# Patient Record
Sex: Female | Born: 1940 | Race: Black or African American | Hispanic: No | Marital: Single | Smoking: Never smoker
Health system: Southern US, Community
[De-identification: ages and names within clinical notes are randomized; demographics above are authoritative.]

## PROBLEM LIST (undated history)

## (undated) DIAGNOSIS — E7800 Pure hypercholesterolemia, unspecified: Secondary | ICD-10-CM

## (undated) DIAGNOSIS — I4891 Unspecified atrial fibrillation: Secondary | ICD-10-CM

## (undated) DIAGNOSIS — K5792 Diverticulitis of intestine, part unspecified, without perforation or abscess without bleeding: Secondary | ICD-10-CM

## (undated) DIAGNOSIS — I10 Essential (primary) hypertension: Secondary | ICD-10-CM

## (undated) DIAGNOSIS — I5032 Chronic diastolic (congestive) heart failure: Secondary | ICD-10-CM

## (undated) HISTORY — PX: CHOLECYSTECTOMY: SHX55

## (undated) HISTORY — PX: COLOSTOMY: SHX63

---

## 2002-10-17 ENCOUNTER — Emergency Department (HOSPITAL_COMMUNITY): Admission: EM | Admit: 2002-10-17 | Discharge: 2002-10-18 | Payer: Self-pay | Admitting: Emergency Medicine

## 2002-10-17 ENCOUNTER — Encounter: Payer: Self-pay | Admitting: Emergency Medicine

## 2012-11-21 ENCOUNTER — Emergency Department (HOSPITAL_COMMUNITY): Payer: Medicare Other

## 2012-11-21 ENCOUNTER — Encounter (HOSPITAL_COMMUNITY): Payer: Self-pay | Admitting: *Deleted

## 2012-11-21 ENCOUNTER — Other Ambulatory Visit: Payer: Self-pay

## 2012-11-21 ENCOUNTER — Emergency Department (HOSPITAL_COMMUNITY)
Admission: EM | Admit: 2012-11-21 | Discharge: 2012-11-21 | Disposition: A | Discharge status: Home / Self Care | Payer: Medicare Other | Attending: Emergency Medicine | Admitting: Emergency Medicine

## 2012-11-21 DIAGNOSIS — R059 Cough, unspecified: Secondary | ICD-10-CM | POA: Insufficient documentation

## 2012-11-21 DIAGNOSIS — E876 Hypokalemia: Secondary | ICD-10-CM | POA: Insufficient documentation

## 2012-11-21 DIAGNOSIS — R5381 Other malaise: Secondary | ICD-10-CM | POA: Insufficient documentation

## 2012-11-21 DIAGNOSIS — R053 Chronic cough: Secondary | ICD-10-CM

## 2012-11-21 DIAGNOSIS — R5383 Other fatigue: Secondary | ICD-10-CM | POA: Insufficient documentation

## 2012-11-21 DIAGNOSIS — R0609 Other forms of dyspnea: Secondary | ICD-10-CM | POA: Insufficient documentation

## 2012-11-21 DIAGNOSIS — R0989 Other specified symptoms and signs involving the circulatory and respiratory systems: Secondary | ICD-10-CM | POA: Insufficient documentation

## 2012-11-21 DIAGNOSIS — E7800 Pure hypercholesterolemia, unspecified: Secondary | ICD-10-CM | POA: Insufficient documentation

## 2012-11-21 DIAGNOSIS — I10 Essential (primary) hypertension: Secondary | ICD-10-CM | POA: Insufficient documentation

## 2012-11-21 DIAGNOSIS — J40 Bronchitis, not specified as acute or chronic: Secondary | ICD-10-CM | POA: Insufficient documentation

## 2012-11-21 DIAGNOSIS — R0600 Dyspnea, unspecified: Secondary | ICD-10-CM

## 2012-11-21 DIAGNOSIS — R05 Cough: Secondary | ICD-10-CM

## 2012-11-21 DIAGNOSIS — Z79899 Other long term (current) drug therapy: Secondary | ICD-10-CM | POA: Insufficient documentation

## 2012-11-21 HISTORY — DX: Essential (primary) hypertension: I10

## 2012-11-21 HISTORY — DX: Pure hypercholesterolemia, unspecified: E78.00

## 2012-11-21 LAB — BASIC METABOLIC PANEL
BUN: 30 mg/dL — ABNORMAL HIGH (ref 6–23)
CO2: 27 mEq/L (ref 19–32)
Calcium: 9.3 mg/dL (ref 8.4–10.5)
Chloride: 104 mEq/L (ref 96–112)
Creatinine, Ser: 0.86 mg/dL (ref 0.50–1.10)
GFR calc Af Amer: 76 mL/min — ABNORMAL LOW (ref >90)
GFR calc non Af Amer: 65 mL/min — ABNORMAL LOW (ref >90)
Glucose, Bld: 129 mg/dL — ABNORMAL HIGH (ref 70–99)
Potassium: 3.2 mEq/L — ABNORMAL LOW (ref 3.5–5.1)
Sodium: 144 mEq/L (ref 135–145)

## 2012-11-21 LAB — CBC
HCT: 42.8 % (ref 36.0–46.0)
Hemoglobin: 14.3 g/dL (ref 12.0–15.0)
MCH: 30 pg (ref 26.0–34.0)
MCHC: 33.4 g/dL (ref 30.0–36.0)
MCV: 89.9 fL (ref 78.0–100.0)
Platelets: 214 10*3/uL (ref 150–400)
RBC: 4.76 MIL/uL (ref 3.87–5.11)
RDW: 14.4 % (ref 11.5–15.5)
WBC: 8.2 10*3/uL (ref 4.0–10.5)

## 2012-11-21 MED ORDER — BENZONATATE 100 MG PO CAPS: 200.0000 mg | ORAL_CAPSULE | Freq: Two times a day (BID) | ORAL | Status: DC | PRN

## 2012-11-21 MED ORDER — IPRATROPIUM BROMIDE 0.02 % IN SOLN
0.5000 mg | Freq: Once | RESPIRATORY_TRACT | Status: AC
  Administered 2012-11-21: 0.5 mg via RESPIRATORY_TRACT
  Filled 2012-11-21: qty 2.5

## 2012-11-21 MED ORDER — PREDNISONE 20 MG PO TABS: 40.0000 mg | ORAL_TABLET | Freq: Every day | ORAL | Status: DC

## 2012-11-21 MED ORDER — ALBUTEROL SULFATE HFA 108 (90 BASE) MCG/ACT IN AERS
2.0000 | INHALATION_SPRAY | Freq: Once | RESPIRATORY_TRACT | Status: AC
  Administered 2012-11-21: 2 via RESPIRATORY_TRACT
  Filled 2012-11-21: qty 6.7

## 2012-11-21 MED ORDER — ALBUTEROL SULFATE (5 MG/ML) 0.5% IN NEBU
5.0000 mg | INHALATION_SOLUTION | Freq: Once | RESPIRATORY_TRACT | Status: AC
  Administered 2012-11-21: 5 mg via RESPIRATORY_TRACT
  Filled 2012-11-21: qty 1

## 2012-11-21 NOTE — ED Provider Notes (Addendum)
History     CSN: [id number]  Arrival date & time 11/21/12  1233   First MD Initiated Contact with Patient 11/21/12 1311      Chief Complaint  Patient presents with  . Shortness of Breath  . Weakness    (Consider location/radiation/quality/duration/timing/severity/associated sxs/prior treatment) HPI Comments: Maria Travis has appeared short of breath to her daughter.  She just returned from a funeral in [place] and her daughter remarked throughout the trip that with any significant exertion, especially walking up stairs, she becomes SOB.  She also has a dry, nonproductive cough.  She has experienced no fevers, chills, leg swelling, or NVD.  She denies chest pain/discomfort.  Patient is a 72 y.o. female presenting with shortness of breath. The history is provided by the patient. No language interpreter was used.  Shortness of Breath  The current episode started more than 2 weeks ago. The onset was gradual. The problem occurs occasionally. The problem has been gradually worsening. The symptoms are relieved by rest. The symptoms are aggravated by activity. Associated symptoms include cough and shortness of breath. Pertinent negatives include no chest pain, no chest pressure, no orthopnea, no fever, no rhinorrhea, no sore throat, no stridor and no wheezing. There was no intake of a foreign body. She has not inhaled smoke recently. She has had intermittent steroid use (completed a steroid course prescribed by a local urgent care.). Prior hospitalizations: no recent. She has had no prior ICU admissions. She has had no prior intubations. Her past medical history is significant for asthma in the family. Her past medical history does not include asthma, bronchiolitis, past wheezing or eczema. She has been behaving normally. Urine output has been normal. There were no sick contacts. Recently, medical care has been given by the PCP and at another facility. Services received include medications given.     Past Medical History  Diagnosis Date  . Hypertension   . High cholesterol     Past Surgical History  Procedure Date  . Cholecystectomy     History reviewed. No pertinent family history.  History  Substance Use Topics  . Smoking status: Never Smoker   . Smokeless tobacco: Never Used  . Alcohol Use: Yes     Comment: weekends    OB History    Grav Para Term Preterm Abortions TAB SAB Ect Mult Living                  Review of Systems  Constitutional: Negative for fever.  HENT: Negative for sore throat and rhinorrhea.   Respiratory: Positive for cough and shortness of breath. Negative for wheezing and stridor.   Cardiovascular: Negative for chest pain and orthopnea.  All other systems reviewed and are negative.    Allergies  Review of patient's allergies indicates no known allergies.  Home Medications   Current Outpatient Rx  Name  Route  Sig  Dispense  Refill  . ALLOPURINOL 100 MG PO TABS   Oral   Take 100 mg by mouth daily.         Marland Kitchen AMLODIPINE BESYLATE 5 MG PO TABS   Oral   Take 5 mg by mouth daily.         . ATORVASTATIN CALCIUM 20 MG PO TABS   Oral   Take 20 mg by mouth daily.         Marland Kitchen HYDROCHLOROTHIAZIDE 25 MG PO TABS   Oral   Take 25 mg by mouth daily.         Marland Kitchen  METOPROLOL SUCCINATE ER 50 MG PO TB24   Oral   Take 50 mg by mouth daily. Take with or immediately following a meal.         . OMEPRAZOLE 20 MG PO CPDR   Oral   Take 20 mg by mouth daily.         Marland Kitchen POTASSIUM CHLORIDE CRYS ER 20 MEQ PO TBCR   Oral   Take 20 mEq by mouth daily.           BP 145/91  Pulse 80  Temp 97.5 F (36.4 C) (Oral)  Resp 20  Wt 145 lb (65.772 kg)  SpO2 98%  Physical Exam  Nursing note and vitals reviewed. Constitutional: She is oriented to person, place, and time. She appears well-developed and well-nourished. No distress. She is not intubated.  HENT:  Head: Normocephalic and atraumatic.  Right Ear: External ear normal.  Left Ear:  External ear normal.  Nose: Nose normal.  Mouth/Throat: Oropharynx is clear and moist. No oropharyngeal exudate.  Eyes: Conjunctivae normal are normal. Pupils are equal, round, and reactive to light. Right eye exhibits no discharge. Left eye exhibits no discharge. No scleral icterus.  Neck: Normal range of motion. Neck supple. No JVD present. No tracheal deviation present.  Cardiovascular: Normal rate, regular rhythm, normal heart sounds and intact distal pulses.  Exam reveals no gallop and no friction rub.   No murmur heard. Pulmonary/Chest: Effort normal. No accessory muscle usage or stridor. No apnea, not tachypneic and not bradypneic. She is not intubated. No respiratory distress. She has decreased breath sounds in the right lower field and the left lower field. She has no wheezes. She has no rhonchi. She has no rales. She exhibits no tenderness.       Pt has a rattling nonproductive cough.  Abdominal: Soft. Bowel sounds are normal. She exhibits no distension and no mass. There is no tenderness. There is no rebound and no guarding.  Musculoskeletal: Normal range of motion. She exhibits no edema and no tenderness.  Lymphadenopathy:    She has no cervical adenopathy.  Neurological: She is alert and oriented to person, place, and time. No cranial nerve deficit.  Skin: Skin is warm and dry. No rash noted. She is not diaphoretic. No erythema. No pallor.  Psychiatric: She has a normal mood and affect. Her behavior is normal.    ED Course  Procedures (including critical care time)   Labs Reviewed  CBC  BASIC METABOLIC PANEL   Dg Chest 2 View  11/21/2012  *RADIOLOGY REPORT*  Clinical Data: Exertional dyspnea, cough  CHEST - 2 VIEW  Comparison: None.  Findings: Normal cardiac silhouette.  There is mild tortuosity and possible slight ectasia of the thoracic aorta.  Minimal left basilar linear heterogeneous opacities favored to represent subsegmental atelectasis.  No focal airspace opacity.  No  pleural effusion or pneumothorax.  No acute osseous abnormalities.  Post cholecystectomy.  IMPRESSION: 1.   Mild tortuosity and possible slight ectasia of the thoracic aorta without definite acute cardiopulmonary disease. 2. Minimal left basilar atelectasis or scar.   Original Report Authenticated By: Tacey Ruiz, MD      No diagnosis found.   Date: 11/21/2012  Rate: 75 bpm  Rhythm: sinus  QRS Axis: left  Intervals: normal  ST/T Wave abnormalities: nonspecific ST changes  Conduction Disutrbances:none  Narrative Interpretation:   Old EKG Reviewed: none available      MDM  Pt presents for evaluation of a persistent cough and  shortness of breath.  She appears nontoxic, note stable VS, NAD.  She has no hx of bronchitis/COPD/emphysema.  Will obtain basic labs and a CXR.  Will review an EKG.  Will administer albuterol/atrovent and reassess.  1520.  Pt stable, NAD.  She has mild hypokalemia.  This is not an acute issue.  She has no clinical evidence of thromboembolic event.  There is no pulmonary edema, effusion, or infiltrate on the CXR.  Her evaluation and exam are consistent with bronchitis.  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  Encouraged her to follow-up closely with her primary physician.      Tobin Chad, MD 11/21/12 1524  Tobin Chad, MD 11/21/12 1526  Tobin Chad, MD 11/21/12 1610

## 2012-11-21 NOTE — Progress Notes (Signed)
[name] and son-in-law are with her. They are also taking care of the patient in #10. They are going back and forth between the two rooms. Offered support; presence.

## 2012-11-21 NOTE — ED Notes (Signed)
Pt ambulated greater than 2ft without difficulty. Pts sats maintained 98-99% on RA. HR increased from 99-103. Pt reported increased SOB with the activity. Pt was able to speak short sentences while ambulating.

## 2012-11-21 NOTE — ED Notes (Signed)
Pt visiting from [place], brought in by daughter with reports of shortness of breath, generalized weakness as well hand and leg cramps that has worsened over the last 2 weeks.

## 2013-12-22 IMAGING — CR DG CHEST 2V
2 series · 2 of 2 positions shown · non-contrast
Comparison: None.

CLINICAL DATA: Exertional dyspnea, cough

CHEST - 2 VIEW

[w chest pa]
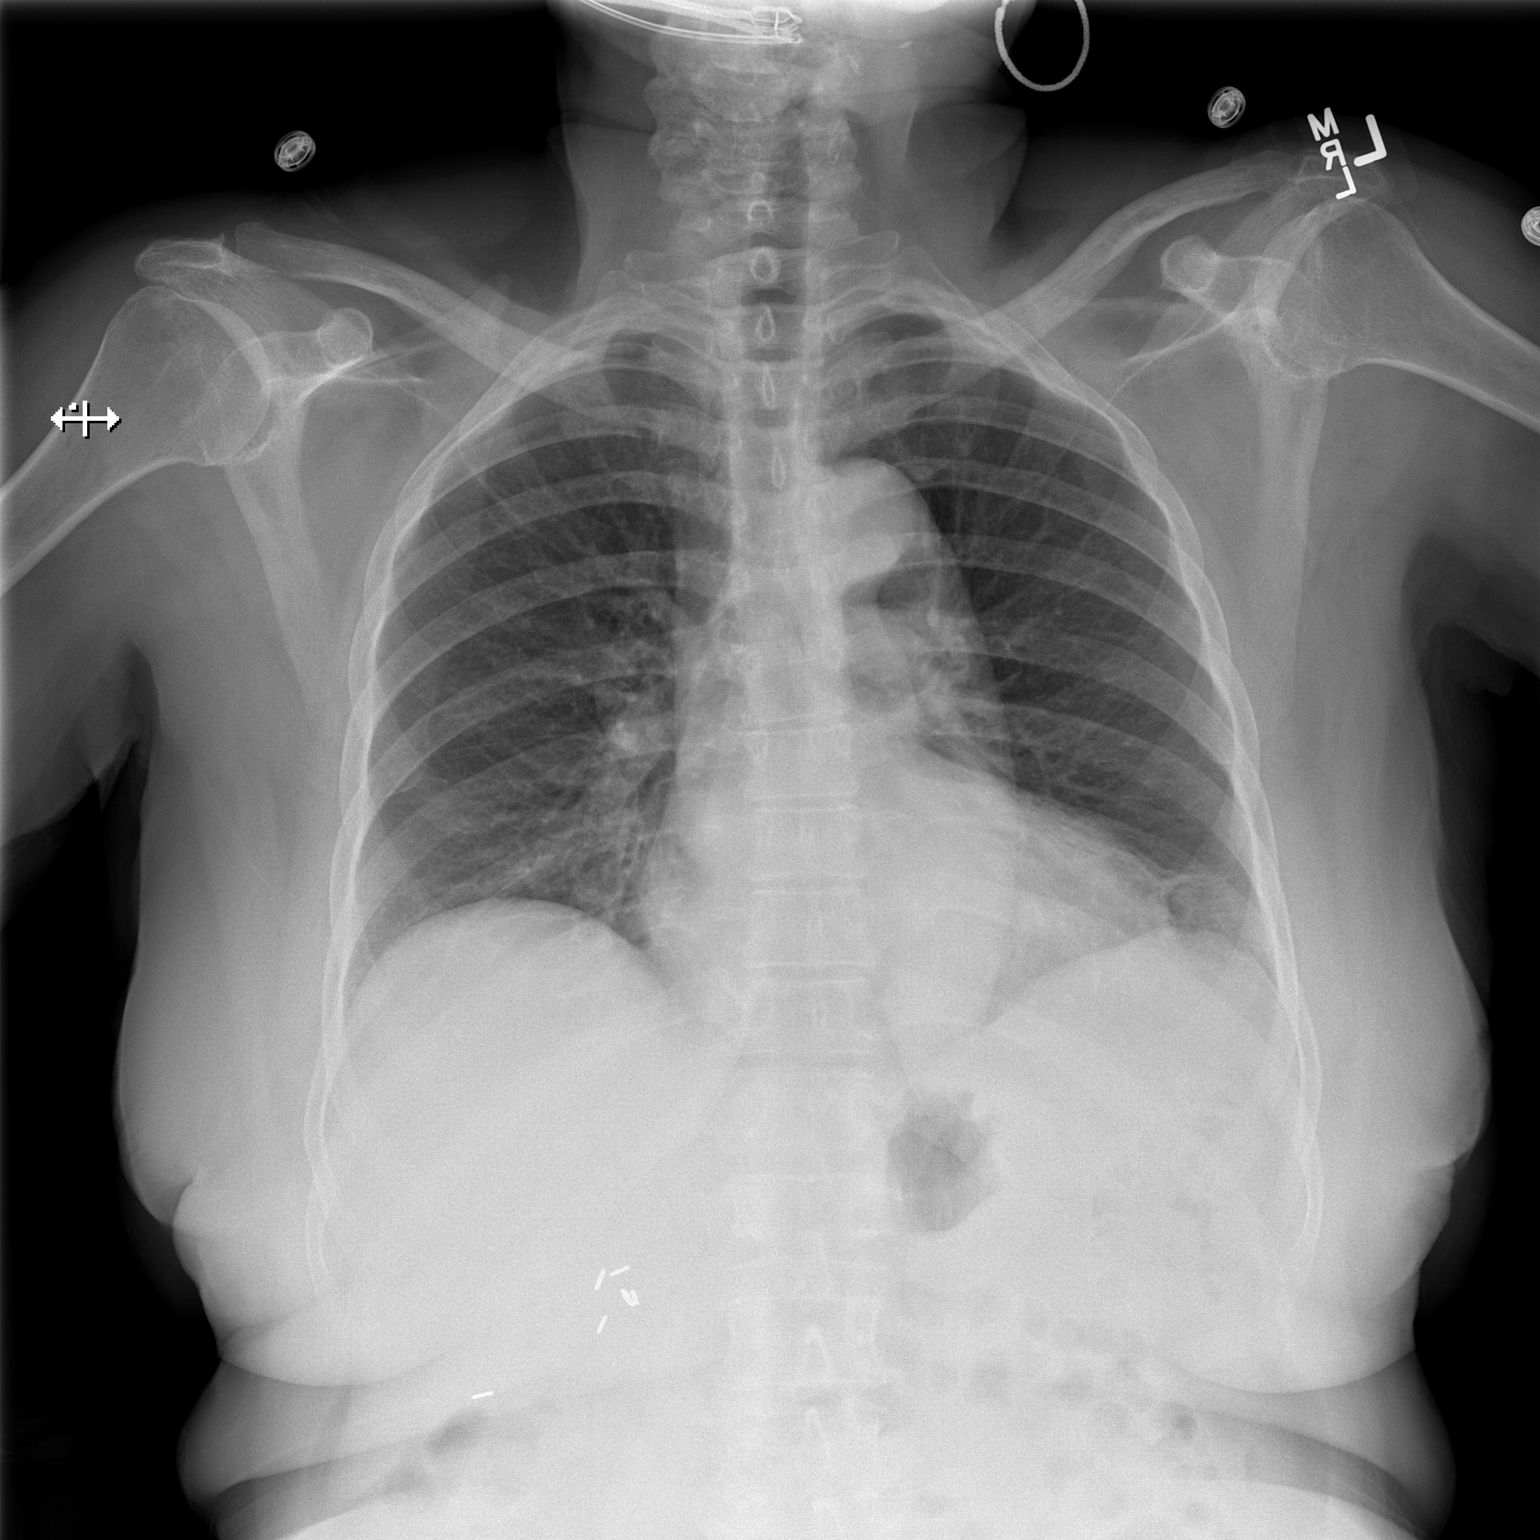

[w chest lat]
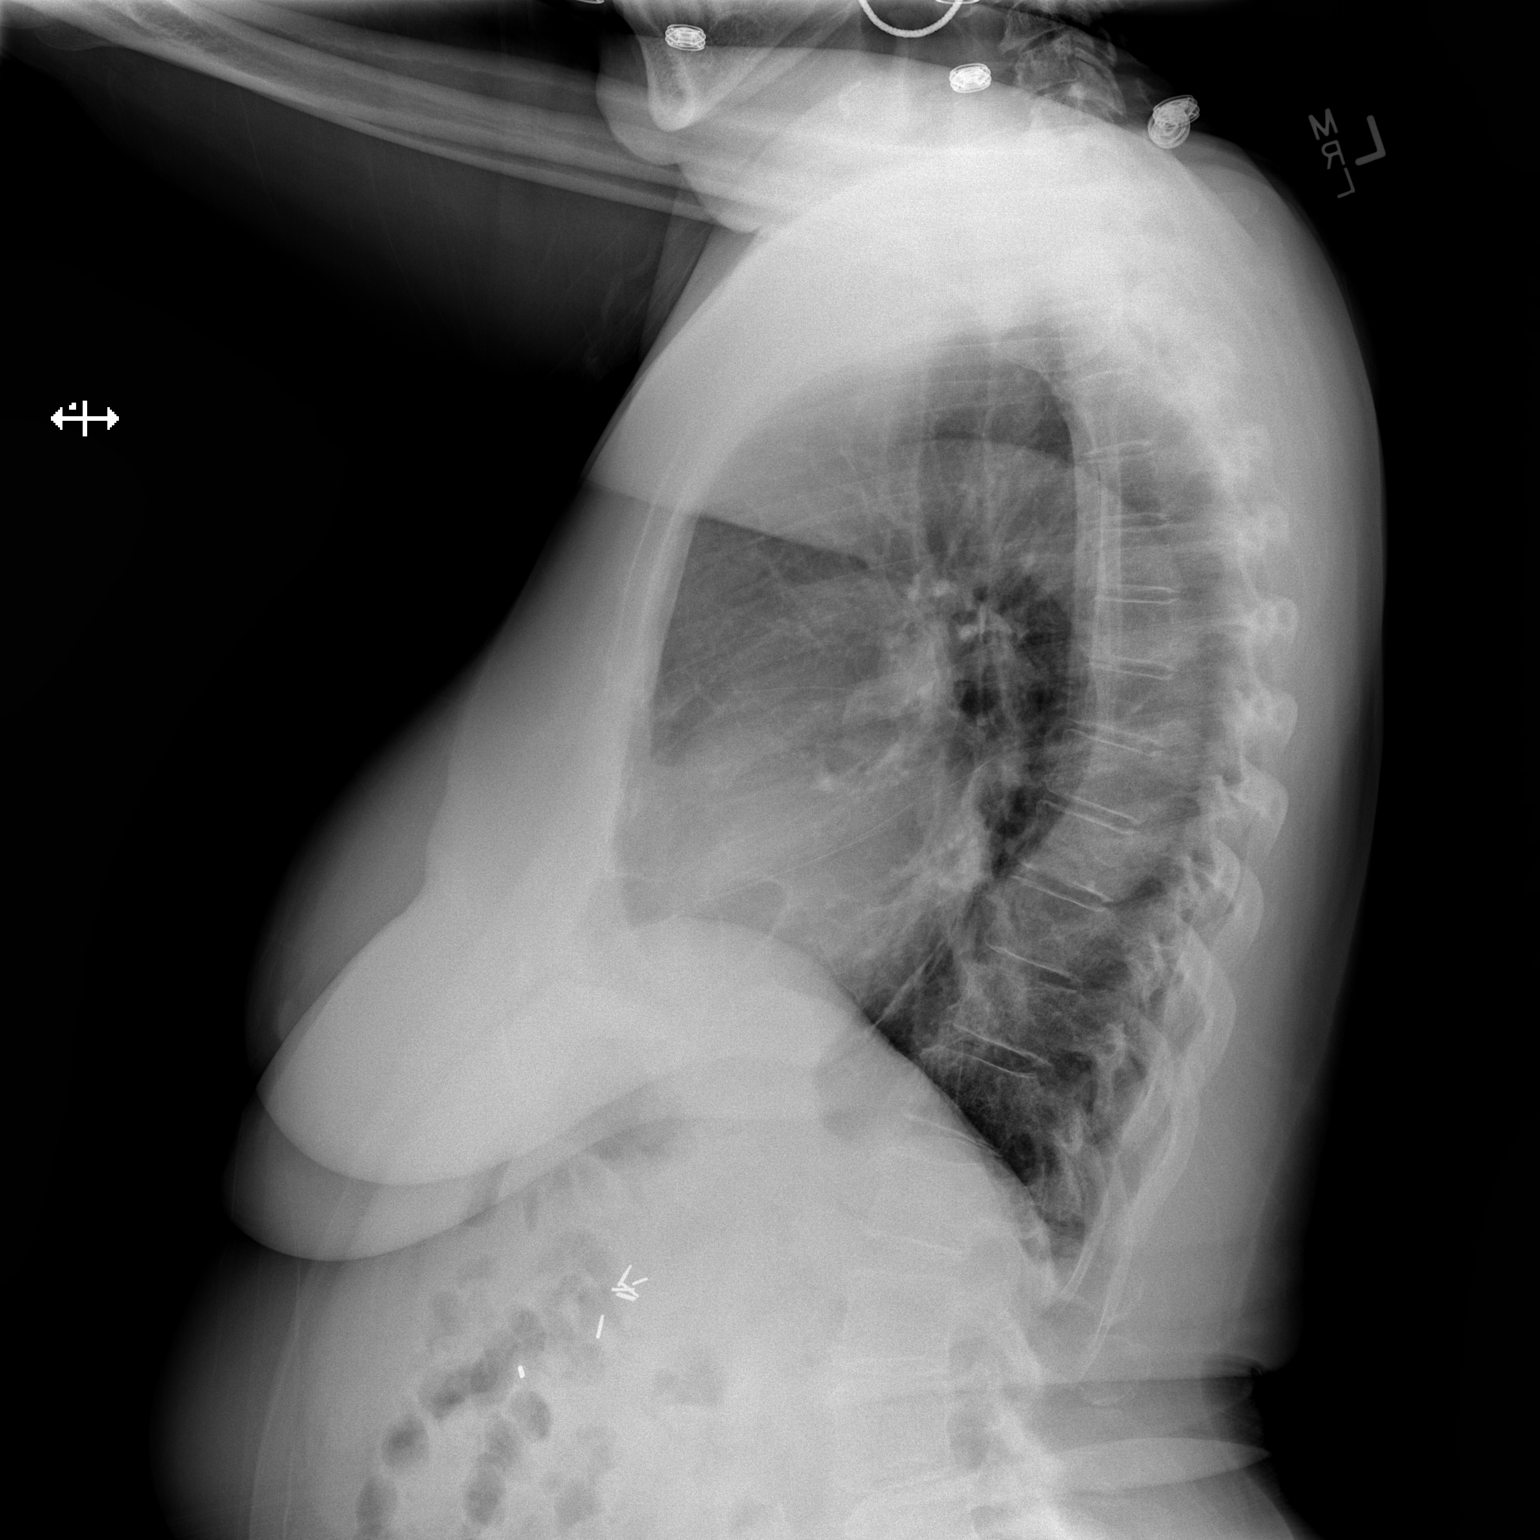

[2 of 2 positions shown; findings below may reference images not displayed]

FINDINGS: Normal cardiac silhouette.  There is mild tortuosity and
possible slight ectasia of the thoracic aorta.  Minimal left
basilar linear heterogeneous opacities favored to represent
subsegmental atelectasis.  No focal airspace opacity.  No pleural
effusion or pneumothorax.  No acute osseous abnormalities.  Post
cholecystectomy.
IMPRESSION: 1.   Mild tortuosity and possible slight ectasia of the thoracic
aorta without definite acute cardiopulmonary disease.
2. Minimal left basilar atelectasis or scar.

## 2020-10-17 ENCOUNTER — Observation Stay (HOSPITAL_COMMUNITY)
Admission: EM | Admit: 2020-10-17 | Discharge: 2020-10-18 | Disposition: A | Discharge status: Home / Self Care | Payer: Medicare Other | Attending: Cardiovascular Disease | Admitting: Cardiovascular Disease

## 2020-10-17 ENCOUNTER — Encounter (HOSPITAL_COMMUNITY): Payer: Self-pay | Admitting: Cardiovascular Disease

## 2020-10-17 ENCOUNTER — Emergency Department (HOSPITAL_COMMUNITY): Payer: Medicare Other

## 2020-10-17 ENCOUNTER — Other Ambulatory Visit: Payer: Self-pay

## 2020-10-17 DIAGNOSIS — Z79899 Other long term (current) drug therapy: Secondary | ICD-10-CM | POA: Insufficient documentation

## 2020-10-17 DIAGNOSIS — R55 Syncope and collapse: Secondary | ICD-10-CM | POA: Diagnosis not present

## 2020-10-17 DIAGNOSIS — I480 Paroxysmal atrial fibrillation: Principal | ICD-10-CM | POA: Diagnosis present

## 2020-10-17 DIAGNOSIS — E876 Hypokalemia: Secondary | ICD-10-CM

## 2020-10-17 DIAGNOSIS — Z20822 Contact with and (suspected) exposure to covid-19: Secondary | ICD-10-CM | POA: Diagnosis not present

## 2020-10-17 DIAGNOSIS — R42 Dizziness and giddiness: Secondary | ICD-10-CM | POA: Diagnosis present

## 2020-10-17 DIAGNOSIS — R778 Other specified abnormalities of plasma proteins: Secondary | ICD-10-CM | POA: Diagnosis not present

## 2020-10-17 DIAGNOSIS — R7989 Other specified abnormal findings of blood chemistry: Secondary | ICD-10-CM

## 2020-10-17 DIAGNOSIS — I4891 Unspecified atrial fibrillation: Secondary | ICD-10-CM

## 2020-10-17 DIAGNOSIS — E785 Hyperlipidemia, unspecified: Secondary | ICD-10-CM

## 2020-10-17 DIAGNOSIS — I10 Essential (primary) hypertension: Secondary | ICD-10-CM

## 2020-10-17 DIAGNOSIS — N179 Acute kidney failure, unspecified: Secondary | ICD-10-CM | POA: Diagnosis not present

## 2020-10-17 LAB — CBC
HCT: 41.9 % (ref 36.0–46.0)
Hemoglobin: 13.6 g/dL (ref 12.0–15.0)
MCH: 32.1 pg (ref 26.0–34.0)
MCHC: 32.5 g/dL (ref 30.0–36.0)
MCV: 98.8 fL (ref 80.0–100.0)
Platelets: 163 10*3/uL (ref 150–400)
RBC: 4.24 MIL/uL (ref 3.87–5.11)
RDW: 12.8 % (ref 11.5–15.5)
WBC: 8.2 10*3/uL (ref 4.0–10.5)
nRBC: 0 % (ref 0.0–0.2)

## 2020-10-17 LAB — BASIC METABOLIC PANEL
Anion gap: 12 (ref 5–15)
BUN: 30 mg/dL — ABNORMAL HIGH (ref 8–23)
CO2: 21 mmol/L — ABNORMAL LOW (ref 22–32)
Calcium: 9 mg/dL (ref 8.9–10.3)
Chloride: 108 mmol/L (ref 98–111)
Creatinine, Ser: 1.55 mg/dL — ABNORMAL HIGH (ref 0.44–1.00)
GFR, Estimated: 33 mL/min — ABNORMAL LOW (ref >60)
Glucose, Bld: 131 mg/dL — ABNORMAL HIGH (ref 70–99)
Potassium: 2.9 mmol/L — ABNORMAL LOW (ref 3.5–5.1)
Sodium: 141 mmol/L (ref 135–145)

## 2020-10-17 LAB — RESP PANEL BY RT-PCR (FLU A&B, COVID) ARPGX2
Influenza A by PCR: NEGATIVE
Influenza B by PCR: NEGATIVE
SARS Coronavirus 2 by RT PCR: NEGATIVE

## 2020-10-17 LAB — TROPONIN I (HIGH SENSITIVITY)
Troponin I (High Sensitivity): 69 ng/L — ABNORMAL HIGH (ref <18)
Troponin I (High Sensitivity): 80 ng/L — ABNORMAL HIGH (ref <18)

## 2020-10-17 MED ORDER — ACETAMINOPHEN 500 MG PO TABS
1000.0000 mg | ORAL_TABLET | Freq: Once | ORAL | Status: AC
  Administered 2020-10-17: 1000 mg via ORAL
  Filled 2020-10-17: qty 2

## 2020-10-17 MED ORDER — METOPROLOL SUCCINATE ER 50 MG PO TB24
50.0000 mg | ORAL_TABLET | Freq: Every day | ORAL | Status: DC
  Administered 2020-10-17: 50 mg via ORAL
  Filled 2020-10-17: qty 1

## 2020-10-17 MED ORDER — ASPIRIN 81 MG PO CHEW
324.0000 mg | CHEWABLE_TABLET | Freq: Once | ORAL | Status: AC
  Administered 2020-10-17: 324 mg via ORAL
  Filled 2020-10-17: qty 4

## 2020-10-17 MED ORDER — SODIUM CHLORIDE 0.9 % IV SOLN: INTRAVENOUS | Status: DC

## 2020-10-17 MED ORDER — HEPARIN (PORCINE) 25000 UT/250ML-% IV SOLN
800.0000 [IU]/h | INTRAVENOUS | Status: DC
  Administered 2020-10-17: 650 [IU]/h via INTRAVENOUS
  Filled 2020-10-17 (×3): qty 250

## 2020-10-17 MED ORDER — HEPARIN BOLUS VIA INFUSION
3000.0000 [IU] | Freq: Once | INTRAVENOUS | Status: AC
  Administered 2020-10-17: 3000 [IU] via INTRAVENOUS
  Filled 2020-10-17: qty 3000

## 2020-10-17 MED ORDER — POTASSIUM CHLORIDE 10 MEQ/100ML IV SOLN
10.0000 meq | Freq: Once | INTRAVENOUS | Status: AC
  Administered 2020-10-17: 10 meq via INTRAVENOUS
  Filled 2020-10-17: qty 100

## 2020-10-17 MED ORDER — ALLOPURINOL 100 MG PO TABS
100.0000 mg | ORAL_TABLET | Freq: Every day | ORAL | Status: DC
  Administered 2020-10-17 – 2020-10-18 (×2): 100 mg via ORAL
  Filled 2020-10-17 (×3): qty 1

## 2020-10-17 MED ORDER — DILTIAZEM HCL-DEXTROSE 125-5 MG/125ML-% IV SOLN (PREMIX)
5.0000 mg/h | INTRAVENOUS | Status: DC
  Administered 2020-10-17: 5 mg/h via INTRAVENOUS
  Administered 2020-10-18: 15 mg/h via INTRAVENOUS
  Filled 2020-10-17 (×3): qty 125

## 2020-10-17 MED ORDER — DILTIAZEM LOAD VIA INFUSION
15.0000 mg | Freq: Once | INTRAVENOUS | Status: AC
  Administered 2020-10-17: 15 mg via INTRAVENOUS
  Filled 2020-10-17: qty 15

## 2020-10-17 MED ORDER — POTASSIUM CHLORIDE CRYS ER 20 MEQ PO TBCR
40.0000 meq | EXTENDED_RELEASE_TABLET | Freq: Every day | ORAL | Status: DC
  Administered 2020-10-17 – 2020-10-18 (×2): 40 meq via ORAL
  Filled 2020-10-17 (×2): qty 2

## 2020-10-17 MED ORDER — POTASSIUM CHLORIDE CRYS ER 20 MEQ PO TBCR
40.0000 meq | EXTENDED_RELEASE_TABLET | Freq: Once | ORAL | Status: AC
  Administered 2020-10-17: 40 meq via ORAL
  Filled 2020-10-17: qty 2

## 2020-10-17 MED ORDER — COLCHICINE 0.6 MG PO TABS
0.6000 mg | ORAL_TABLET | Freq: Every day | ORAL | Status: DC
  Administered 2020-10-17 – 2020-10-18 (×2): 0.6 mg via ORAL
  Filled 2020-10-17 (×2): qty 1

## 2020-10-17 MED ORDER — ACETAMINOPHEN 325 MG PO TABS: 650.0000 mg | ORAL_TABLET | ORAL | Status: DC | PRN

## 2020-10-17 MED ORDER — PANTOPRAZOLE SODIUM 40 MG PO TBEC
40.0000 mg | DELAYED_RELEASE_TABLET | Freq: Every day | ORAL | Status: DC
  Administered 2020-10-17 – 2020-10-18 (×2): 40 mg via ORAL
  Filled 2020-10-17 (×2): qty 1

## 2020-10-17 MED ORDER — AMLODIPINE BESYLATE 5 MG PO TABS: 5.0000 mg | ORAL_TABLET | Freq: Every day | ORAL | Status: DC

## 2020-10-17 MED ORDER — ATORVASTATIN CALCIUM 10 MG PO TABS
20.0000 mg | ORAL_TABLET | Freq: Every day | ORAL | Status: DC
  Administered 2020-10-17 – 2020-10-18 (×2): 20 mg via ORAL
  Filled 2020-10-17 (×2): qty 2

## 2020-10-17 MED ORDER — ONDANSETRON HCL 4 MG/2ML IJ SOLN: 4.0000 mg | Freq: Four times a day (QID) | INTRAMUSCULAR | Status: DC | PRN

## 2020-10-17 NOTE — ED Triage Notes (Signed)
Pt BIB [facility] w/ chief complaint of new onset of afib. Pt from [place] and over the past few days states she has felt intermittently dizzy. Pt suffered a fall last night without injury or hitting head. Pt also had two syncopal episodes in front of family this morning. Pt denies chest pain, n.v.d, SoB, but just feeling weak and dizzy. Pt noted to be in Afib but does not have hx of Afib. Pt not on a blood thinner. EMS endorsed HR initially in the 190s but after 500 of fluid rates stayed between 80-120. BP stable, all other VS stable. Pt AOx4.

## 2020-10-17 NOTE — Progress Notes (Signed)
ANTICOAGULATION CONSULT NOTE - Initial Consult  Pharmacy Consult for heparin Indication: atrial fibrillation  No Known Allergies  Patient Measurements:   Heparin Dosing Weight: 57.3 kg   Vital Signs: BP: 144/90 (12/24 1815) Pulse Rate: 56 (12/24 1815)  Labs: Recent Labs    10/17/20 1055 10/17/20 1352  HGB 13.6  --   HCT 41.9  --   PLT 163  --   CREATININE 1.55*  --   TROPONINIHS 69* 80*    CrCl cannot be calculated (Unknown ideal weight.).   Medical History: Past Medical History:  Diagnosis Date  . High cholesterol   . Hypertension     Medications:  Medications Prior to Admission  Medication Sig Dispense Refill Last Dose  . allopurinol (ZYLOPRIM) 100 MG tablet Take 100 mg by mouth daily.   Past Week at Unknown time  . amLODipine (NORVASC) 5 MG tablet Take 5 mg by mouth daily.   Past Week at Unknown time  . atorvastatin (LIPITOR) 20 MG tablet Take 20 mg by mouth daily.   Past Week at Unknown time  . colchicine 0.6 MG tablet Take 0.6 mg by mouth daily.   Past Week at Unknown time  . losartan (COZAAR) 50 MG tablet Take 50 mg by mouth daily.   Past Week at Unknown time  . metoprolol succinate (TOPROL-XL) 50 MG 24 hr tablet Take 50 mg by mouth daily. Take with or immediately following a meal.   Past Week at [facility]  . pantoprazole (PROTONIX) 40 MG tablet Take 40 mg by mouth daily.   Past Week at Unknown time  . benzonatate (TESSALON) 100 MG capsule Take 2 capsules (200 mg total) by mouth 2 (two) times daily as needed for cough. (Patient not taking: Reported on 10/17/2020) 12 capsule 0 Not Taking at Unknown time  . predniSONE (DELTASONE) 20 MG tablet Take 2 tablets (40 mg total) by mouth daily. Take 40 mg by mouth daily for 3 days, then 20mg  by mouth daily for 3 days, then 10mg  daily for 3 days (Patient not taking: No sig reported) 12 tablet 0 Not Taking at Unknown time    Assessment: 89 YOF with new onset Afib to start IV heparin.   H/H and Plt wnl. SCr 1.55.   Goal of  Therapy:  Heparin level 0.3-0.7 units/ml Monitor platelets by anticoagulation protocol: Yes   Plan:  -Heparin 3000 units IV bolus followed by heparin infusion at 650 units/hr  -F/u 8 hr HL -Monitor daily HL, CBC and s/s of bleeding   Albertina Parr, PharmD., BCPS, BCCCP Clinical Pharmacist Please refer to [facility] for unit-specific pharmacist

## 2020-10-17 NOTE — Progress Notes (Signed)
Called pharmacy to request dose and sent a message. Awaiting medication.

## 2020-10-17 NOTE — ED Provider Notes (Signed)
[facility] EMERGENCY DEPARTMENT Provider Note   CSN: [id number] Arrival date & time: 10/17/20  1039     History Chief Complaint  Patient presents with  . Irregular Heart Beat  . Dizziness  . Loss of Consciousness    Maria Travis is a 79 y.o. female.  HPI   Patient presents to the emergency room for evaluation of dizziness.  Patient is here visiting family.  Patient has had some intermittent dizziness over the last couple days.  According to family she ended up falling last night.  Today she had 2 near fainting spells.  Family was concerned and called EMS.  Patient denies having any complaints right now.  She was feeling a little bit lightheaded this morning.  She denies having any trouble with chest pain.  She is not having any shortness of breath.  She denies any fevers or chills.  She denies any vomiting or diarrhea.  Patient is not aware of having any history with her heart or lungs.  Past Medical History:  Diagnosis Date  . High cholesterol   . Hypertension     There are no problems to display for this patient.   Past Surgical History:  Procedure Laterality Date  . CHOLECYSTECTOMY       OB History   No obstetric history on file.     No family history on file.  Social History   Tobacco Use  . Smoking status: Never Smoker  . Smokeless tobacco: Never Used  Substance Use Topics  . Alcohol use: Yes    Comment: weekends  . Drug use: No    Home Medications Prior to Admission medications   Medication Sig Start Date End Date Taking? Authorizing Provider  allopurinol (ZYLOPRIM) 100 MG tablet Take 100 mg by mouth daily.   Yes [provider]  amLODipine (NORVASC) 5 MG tablet Take 5 mg by mouth daily.   Yes [provider]  atorvastatin (LIPITOR) 20 MG tablet Take 20 mg by mouth daily.   Yes [provider]  colchicine 0.6 MG tablet Take 0.6 mg by mouth daily.   Yes [provider]  losartan (COZAAR) 50  MG tablet Take 50 mg by mouth daily.   Yes [provider]  metoprolol succinate (TOPROL-XL) 50 MG 24 hr tablet Take 50 mg by mouth daily. Take with or immediately following a meal.   Yes [provider]  pantoprazole (PROTONIX) 40 MG tablet Take 40 mg by mouth daily.   Yes [provider]  benzonatate (TESSALON) 100 MG capsule Take 2 capsules (200 mg total) by mouth 2 (two) times daily as needed for cough. Patient not taking: Reported on 10/17/2020 11/21/12   Powers, Lonia Farber, MD  predniSONE (DELTASONE) 20 MG tablet Take 2 tablets (40 mg total) by mouth daily. Take 40 mg by mouth daily for 3 days, then 20mg  by mouth daily for 3 days, then 10mg  daily for 3 days Patient not taking: No sig reported 11/21/12   Powers, Lonia Farber, MD    Allergies    Patient has no known allergies.  Review of Systems   Review of Systems  All other systems reviewed and are negative.   Physical Exam Updated Vital Signs BP 126/88   Pulse 87   Resp (!) 22   SpO2 98%   Physical Exam Vitals and nursing note reviewed.  Constitutional:      General: She is not in acute distress.    Appearance: She is well-developed and  well-nourished.  HENT:     Head: Normocephalic and atraumatic.     Right Ear: External ear normal.     Left Ear: External ear normal.  Eyes:     General: No scleral icterus.       Right eye: No discharge.        Left eye: No discharge.     Conjunctiva/sclera: Conjunctivae normal.  Neck:     Trachea: No tracheal deviation.  Cardiovascular:     Rate and Rhythm: Tachycardia present. Rhythm irregular.     Pulses: Intact distal pulses.  Pulmonary:     Effort: Pulmonary effort is normal. No respiratory distress.     Breath sounds: Normal breath sounds. No stridor. No wheezing or rales.  Abdominal:     General: Bowel sounds are normal. There is no distension.     Palpations: Abdomen is soft.     Tenderness: There is no abdominal tenderness. There is no guarding or  rebound.  Musculoskeletal:        General: No tenderness or edema.     Cervical back: Neck supple.  Skin:    General: Skin is warm and dry.     Findings: No rash.  Neurological:     Mental Status: She is alert.     Cranial Nerves: No cranial nerve deficit (no facial droop, extraocular movements intact, no slurred speech).     Sensory: No sensory deficit.     Motor: No abnormal muscle tone or seizure activity.     Coordination: Coordination normal.     Deep Tendon Reflexes: Strength normal.  Psychiatric:        Mood and Affect: Mood and affect normal.     ED Results / Procedures / Treatments   Labs (all labs ordered are listed, but only abnormal results are displayed) Labs Reviewed  BASIC METABOLIC PANEL - Abnormal; Notable for the following components:      Result Value   Potassium 2.9 (*)    CO2 21 (*)    Glucose, Bld 131 (*)    BUN 30 (*)    Creatinine, Ser 1.55 (*)    GFR, Estimated 33 (*)    All other components within normal limits  TROPONIN I (HIGH SENSITIVITY) - Abnormal; Notable for the following components:   Troponin I (High Sensitivity) 69 (*)    All other components within normal limits  CBC  CBG MONITORING, ED  TROPONIN I (HIGH SENSITIVITY)    EKG EKG Interpretation  Date/Time:  Friday October 17 2020 10:44:56 EST Ventricular Rate:  150 PR Interval:    QRS Duration: 74 QT Interval:  295 QTC Calculation: 466 R Axis:   -132 Text Interpretation: Atrial fibrillation with rapid V-rate Inferior infarct, old Abnormal lateral Q waves Anterior infarct, old a fib new since last tracing Confirmed by Dorie Rank [phone]) on 10/17/2020 11:11:28 AM   Radiology DG Chest Portable 1 View  Result Date: 10/17/2020 CLINICAL DATA:  Palpitations. EXAM: PORTABLE CHEST 1 VIEW COMPARISON:  11/21/2012 FINDINGS: 1126 hours. The cardio pericardial silhouette is enlarged. Right lung is clear. Streaky retrocardiac opacity suggests atelectasis. No pulmonary edema or dense focal  airspace consolidation. No pleural effusion. Telemetry leads overlie the chest. IMPRESSION: Cardiomegaly with left base atelectasis. Electronically Signed   By: Misty Stanley M.D.   On: 10/17/2020 11:42    Procedures Procedures (including critical care time)  Medications Ordered in ED Medications  0.9 %  sodium chloride infusion ( Intravenous New Bag/Given 10/17/20 1135)  diltiazem (  CARDIZEM) 1 mg/mL load via infusion 15 mg (15 mg Intravenous Bolus from Bag 10/17/20 1130)    And  diltiazem (CARDIZEM) 125 mg in dextrose 5% 125 mL (1 mg/mL) infusion (10 mg/hr Intravenous Rate/Dose Change 10/17/20 1353)  potassium chloride 10 mEq in 100 mL IVPB (has no administration in time range)  potassium chloride SA (KLOR-CON) CR tablet 40 mEq (has no administration in time range)  aspirin chewable tablet 324 mg (324 mg Oral Given 10/17/20 1115)    ED Course  I have reviewed the triage vital signs and the nursing notes.  Pertinent labs & imaging results that were available during my care of the patient were reviewed by me and considered in my medical decision making (see chart for details).  Clinical Course as of 10/17/20 1428  Fri Oct 17, 2020  1207 Patient noted to be in rapid A. fib on arrival.  Heart rate was in the 140s.  Patient started on a Cardizem infusion [JK]  1247 Heart rate improved.  Still appears to be in atrial fibrillation on the monitor [JK]    Clinical Course User Index [JK] Dorie Rank, MD   MDM Rules/Calculators/A&P     CHA2DS2-VASc Score: 4                     Patient presents ED for evaluation of palpitations and syncope.  Patient noted to be in new onset atrial fibrillation with rapid rate.  Patient started IV Cardizem infusion with some improvement.  Currently patient is rate controlled but she still is on a Cardizem infusion.  Patient's labs were notable for hypokalemia.  Slight increase in troponin likely related to her rapid A. fib.  She is not having any chest pain.  I  doubt ACS.  I will consult with the cardiology service considering her new onset A. fib. Final Clinical Impression(s) / ED Diagnoses Final diagnoses:  Atrial fibrillation, rapid ([place])  Hypokalemia      Dorie Rank, MD 10/18/20 [phone]

## 2020-10-17 NOTE — ED Provider Notes (Signed)
  Provider Note MRN:  [id number]  Arrival date & time: 10/17/20    ED Course and Medical Decision Making  Assumed care from Dr. Tomi Bamberger at shift change.  New onset A. fib, cardiology consulted for admission.  Admitted to cardiology for further care.  Procedures  Final Clinical Impressions(s) / ED Diagnoses     ICD-10-CM   1. Atrial fibrillation, rapid ([place])  I48.91   2. Hypokalemia  E87.6     ED Discharge Orders    None      Discharge Instructions   None     Barth Kirks. [place]onia Small, [place] [email or web address]    Maudie Flakes, MD 10/17/20 [phone]

## 2020-10-17 NOTE — Plan of Care (Signed)

## 2020-10-17 NOTE — ED Notes (Signed)
Pt going to CT

## 2020-10-17 NOTE — H&P (Signed)
Cardiology Admission History and Physical:   Patient ID: Maria Travis MRN: [id number]; DOB: [birth year]   Admission date: 10/17/2020  Primary Care Provider: Pcp, No [facility] Cardiologist: New to [facility]  Chief Complaint: Dizziness, syncope   Patient Profile:   Maria Travis is a 79 y.o. 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   History of Present Illness:   Maria Travis is an 79 year old female with a history stated above who presented to [facility] on 10/17/2020 after two witnessed syncopal episodes yesterday and early this AM.  Patient is visiting from [place] where she lives independently. She states that she had a near syncopal episode while getting ready in her bathroom.  She reports feeling mildly dizzy however no palpitations or racing heart.  She reports that she did not lose consciousness and rather caught herself on the side of the sink.  Earlier this morning, the daughter states that they had walked up a flight of stairs at which time the patient was grabbing the handrail in the walls.  Upon walking into the bedroom, the patient leaned over and was holding the dresser.  When prompted for response by her daughter, the patient just today with a glazed, blank stare with no response.  The daughter then stood behind her and lowered her to the floor.  This episode lasted several minutes in duration.  EMS was called for transport to the ED for further evaluation.  HPI slightly difficult to obtain given poor event recall.  She denies any episode of chest pain, palpitations, shortness of breath, LE edema, orthopnea, nausea or vomiting.  She cannot go into much detail regarding prior episodes.  Daughter is giving most details.  She has no prior history of CAD and has never  undergone ischemic evaluation.  She has no prior history of palpitations or atrial fibrillation.  Daughter reports several other falls which have occurred while the patient was in [place] however she does not know the history behind the falls.  There was no injury.   On EMS arrival, HR found to be in the 190 bpm range however moderately stabilized to the 80-120 bpm range after IVF. EKG showed new onset atrial fibrillation with RVR.  Patient was placed on a diltiazem infusion with some improvement.  K+ found to be 2.9 and replaced by EDP.  HST found to be mildly elevated at 69 with a repeat at 80 however in the absence of chest pain and no prior cardiac history, elevation likely in the setting of RVR. CXR with cardiomegaly with left base atelectasis.  Past Medical History:  Diagnosis Date  . High cholesterol   . Hypertension     Past Surgical History:  Procedure Laterality Date  . CHOLECYSTECTOMY       Medications Prior to Admission: Prior to Admission medications   Medication Sig Start Date End Date Taking? Authorizing Provider  allopurinol (ZYLOPRIM) 100 MG tablet Take 100 mg by mouth daily.   Yes [provider]  amLODipine (NORVASC) 5 MG tablet Take 5 mg by mouth daily.   Yes [provider]  atorvastatin (LIPITOR) 20 MG tablet Take 20 mg by mouth daily.   Yes [provider]  colchicine 0.6 MG tablet Take 0.6 mg by mouth daily.   Yes [provider]  losartan (COZAAR) 50 MG tablet Take 50  mg by mouth daily.   Yes [provider]  metoprolol succinate (TOPROL-XL) 50 MG 24 hr tablet Take 50 mg by mouth daily. Take with or immediately following a meal.   Yes [provider]  pantoprazole (PROTONIX) 40 MG tablet Take 40 mg by mouth daily.   Yes [provider]  benzonatate (TESSALON) 100 MG capsule Take 2 capsules (200 mg total) by mouth 2 (two) times daily as needed for cough. Patient not taking: Reported on 10/17/2020 11/21/12    Powers, Lonia Farber, MD  predniSONE (DELTASONE) 20 MG tablet Take 2 tablets (40 mg total) by mouth daily. Take 40 mg by mouth daily for 3 days, then 20mg  by mouth daily for 3 days, then 10mg  daily for 3 days Patient not taking: No sig reported 11/21/12   Powers, Lonia Farber, MD     Allergies:   No Known Allergies  Social History:   Social History   Socioeconomic History  . Marital status: Single    Spouse name: Not on file  . Number of children: Not on file  . Years of education: Not on file  . Highest education level: Not on file  Occupational History  . Not on file  Tobacco Use  . Smoking status: Never Smoker  . Smokeless tobacco: Never Used  Substance and Sexual Activity  . Alcohol use: Yes    Comment: weekends  . Drug use: No  . Sexual activity: Not on file  Other Topics Concern  . Not on file  Social History Narrative  . Not on file   Social Determinants of Health   Financial Resource Strain: Not on file  Food Insecurity: Not on file  Transportation Needs: Not on file  Physical Activity: Not on file  Stress: Not on file  Social Connections: Not on file  Intimate Partner Violence: Not on file    Family History:   The patient's family history is not on file.    ROS:  Please see the history of present illness.  All other ROS reviewed and negative.     Physical Exam/Data:   Vitals:   10/17/20 1330 10/17/20 1430 10/17/20 1445 10/17/20 1500  BP: 126/88 (!) 142/79 (!) 152/92 (!) 159/94  Pulse: 87 63 93 (!) 108  Resp: (!) 22 (!) 21 19 20   SpO2: 98% 99% 98% 97%   No intake or output data in the 24 hours ending 10/17/20 1541 Last 3 Weights 11/21/2012  Weight (lbs) 145 lb  Weight (kg) 65.772 kg     There is no height or weight on file to calculate BMI.   General: Elderly, NAD Neck: Negative for carotid bruits. No JVD Lungs:Clear to ausculation bilaterally. No wheezes, rales, or rhonchi. Breathing is unlabored. Cardiovascular: Irregularly irregular. +  murmur Abdomen: Soft, non-tender, non-distended. No rebound/guarding. No obvious abdominal masses. Extremities: No edema.  Radial pulses 2+ bilaterally Neuro: Alert and oriented. No focal deficits. No facial asymmetry. MAE spontaneously. Psych: Responds to questions appropriately with normal affect.    EKG:  The ECG that was done 10/17/2020 was personally reviewed and demonstrates atrial fibrillation with HR at 150 bpm  Relevant CV Studies:  None  Laboratory Data:  High Sensitivity Troponin:   Recent Labs  Lab 10/17/20 1055 10/17/20 1352  TROPONINIHS 69* 80*      Chemistry Recent Labs  Lab 10/17/20 1055  NA 141  K 2.9*  CL 108  CO2 21*  GLUCOSE 131*  BUN 30*  CREATININE 1.55*  CALCIUM 9.0  GFRNONAA 33*  ANIONGAP 12    No results for input(s): PROT, ALBUMIN, AST, ALT, ALKPHOS, BILITOT in the last 168 hours. Hematology Recent Labs  Lab 10/17/20 1055  WBC 8.2  RBC 4.24  HGB 13.6  HCT 41.9  MCV 98.8  MCH 32.1  MCHC 32.5  RDW 12.8  PLT 163   BNPNo results for input(s): BNP, PROBNP in the last 168 hours.  DDimer No results for input(s): DDIMER in the last 168 hours.   Radiology/Studies:  DG Chest Portable 1 View  Result Date: 10/17/2020 CLINICAL DATA:  Palpitations. EXAM: PORTABLE CHEST 1 VIEW COMPARISON:  11/21/2012 FINDINGS: 1126 hours. The cardio pericardial silhouette is enlarged. Right lung is clear. Streaky retrocardiac opacity suggests atelectasis. No pulmonary edema or dense focal airspace consolidation. No pleural effusion. Telemetry leads overlie the chest. IMPRESSION: Cardiomegaly with left base atelectasis. Electronically Signed   By: Misty Stanley M.D.   On: 10/17/2020 11:42   Assessment and Plan:   1.  New onset atrial fibrillation with RVR with presumed syncope: -HPI relatively difficult to obtain given poor historian.  Patient reports to episodes of near syncope/syncope while visiting her daughter from [place].  Initial episode occurred while  in the bathroom at which time she did not lose consciousness however did have dizziness which lasted several minutes in duration.  On the second episode, the patient and daughter had walked a flight of stairs and the patient appeared to be leaning over and resting on a dresser.  When prompted for response by her daughter, the patient had a blank stare at which time the patient was lowered to the floor by her daughter when they called EMS.  She denies chest pain, or palpitations.  Difficult to discern exactly because of episodes described above.  On EMS arrival, patient found to be in AF with RVR and was started on IV diltiazem with rate control.  She has not taken her PTA Toprol-XL.  Rates per telemetry review are currently in the one ten range.  She is resting comfortably. -Would recommend adding PTA Toprol XL 50 mg daily to her regimen given stable BP -Obtain echocardiogram to assess for structural disease -Would also recommend non-contrast head CT given the atypical presentation of atrial fibrillation to r/o CVA -Would start IV heparin for anticoagulation however will need to discuss long-term AC given reported multiple falls at her home in [place] per daughter.  2.  HTN: -Stable, 159/94, 152/92, 142/79 -Hold home losartan 50 given AKI for now -Continue Toprol XL 50 and amlodipine 5 -Currently IV diltiazem. If abnormal echo, will need to stop   3.  HLD: -Continue atorvastatin 20  4.  Hypokalemia: -K+, 2.9 on hospital admission replaced per EDP -Follow with daily BMET  5.  Elevated troponin: -Low suspicion for ACS given the absence of angina and no prior history of CAD -Elevation likely in the setting of rapid rates on hospital presentation -We will check echocardiogram given new AF and assess for LV dysfunction  6. AKI: -Creatinine today, 1.51 -Remote baseline appears to be in the 0.8 range  -Hold losartan for now -May need IVF -Follow with daily BMET          CHA2DS2-VASc Score  = 4  This indicates a 4.8% annual risk of stroke. The patient's score is based upon: CHF History: No HTN History: Yes Diabetes History: No Stroke History: No Vascular Disease History: No Age Score: 2 Gender Score: 1    Severity of Illness: The appropriate patient status  for this patient is OBSERVATION. Observation status is judged to be reasonable and necessary in order to provide the required intensity of service to ensure the patient's safety. The patient's presenting symptoms, physical exam findings, and initial radiographic and laboratory data in the context of their medical condition is felt to place them at decreased risk for further clinical deterioration. Furthermore, it is anticipated that the patient will be medically stable for discharge from the hospital within 2 midnights of admission. The following factors support the patient status of observation.   " The patient's presenting symptoms include syncope. " The physical exam findings include atrial fibrillation with RVR. " The initial radiographic and laboratory data are abnormal EKG.     For questions or updates, please contact [place] Please consult [email or web address] for contact info under     Signed, Maria Drown, NP  10/17/2020 3:41 PM

## 2020-10-18 ENCOUNTER — Observation Stay (HOSPITAL_BASED_OUTPATIENT_CLINIC_OR_DEPARTMENT_OTHER): Payer: Medicare Other

## 2020-10-18 DIAGNOSIS — R778 Other specified abnormalities of plasma proteins: Secondary | ICD-10-CM

## 2020-10-18 DIAGNOSIS — E785 Hyperlipidemia, unspecified: Secondary | ICD-10-CM

## 2020-10-18 DIAGNOSIS — I4891 Unspecified atrial fibrillation: Secondary | ICD-10-CM

## 2020-10-18 DIAGNOSIS — I361 Nonrheumatic tricuspid (valve) insufficiency: Secondary | ICD-10-CM

## 2020-10-18 DIAGNOSIS — E782 Mixed hyperlipidemia: Secondary | ICD-10-CM

## 2020-10-18 DIAGNOSIS — N179 Acute kidney failure, unspecified: Secondary | ICD-10-CM

## 2020-10-18 DIAGNOSIS — I10 Essential (primary) hypertension: Secondary | ICD-10-CM

## 2020-10-18 DIAGNOSIS — I351 Nonrheumatic aortic (valve) insufficiency: Secondary | ICD-10-CM

## 2020-10-18 DIAGNOSIS — I340 Nonrheumatic mitral (valve) insufficiency: Secondary | ICD-10-CM

## 2020-10-18 DIAGNOSIS — I480 Paroxysmal atrial fibrillation: Principal | ICD-10-CM

## 2020-10-18 LAB — ECHOCARDIOGRAM COMPLETE
AR max vel: 1.29 cm2
AV Area VTI: 1.21 cm2
AV Area mean vel: 1.22 cm2
AV Mean grad: 4 mmHg
AV Peak grad: 7.5 mmHg
Ao pk vel: 1.37 m/s
Area-P 1/2: 3.17 cm2
Height: 60 in
P 1/2 time: 376 msec
S' Lateral: 3 cm
Weight: 2017.65 oz

## 2020-10-18 LAB — BASIC METABOLIC PANEL
Anion gap: 11 (ref 5–15)
BUN: 20 mg/dL (ref 8–23)
CO2: 18 mmol/L — ABNORMAL LOW (ref 22–32)
Calcium: 8.7 mg/dL — ABNORMAL LOW (ref 8.9–10.3)
Chloride: 110 mmol/L (ref 98–111)
Creatinine, Ser: 1.06 mg/dL — ABNORMAL HIGH (ref 0.44–1.00)
GFR, Estimated: 53 mL/min — ABNORMAL LOW (ref >60)
Glucose, Bld: 100 mg/dL — ABNORMAL HIGH (ref 70–99)
Potassium: 4.2 mmol/L (ref 3.5–5.1)
Sodium: 139 mmol/L (ref 135–145)

## 2020-10-18 LAB — CBC
HCT: 35 % — ABNORMAL LOW (ref 36.0–46.0)
Hemoglobin: 12.1 g/dL (ref 12.0–15.0)
MCH: 32.8 pg (ref 26.0–34.0)
MCHC: 34.6 g/dL (ref 30.0–36.0)
MCV: 94.9 fL (ref 80.0–100.0)
Platelets: 162 10*3/uL (ref 150–400)
RBC: 3.69 MIL/uL — ABNORMAL LOW (ref 3.87–5.11)
RDW: 12.8 % (ref 11.5–15.5)
WBC: 9.9 10*3/uL (ref 4.0–10.5)
nRBC: 0 % (ref 0.0–0.2)

## 2020-10-18 LAB — LIPID PANEL
Cholesterol: 162 mg/dL (ref 0–200)
HDL: 53 mg/dL (ref >40)
LDL Cholesterol: 79 mg/dL (ref 0–99)
Total CHOL/HDL Ratio: 3.1 RATIO
Triglycerides: 148 mg/dL (ref <150)
VLDL: 30 mg/dL (ref 0–40)

## 2020-10-18 LAB — TSH: TSH: 1.288 u[IU]/mL (ref 0.350–4.500)

## 2020-10-18 LAB — HEPARIN LEVEL (UNFRACTIONATED): Heparin Unfractionated: 0.19 IU/mL — ABNORMAL LOW (ref 0.30–0.70)

## 2020-10-18 LAB — MAGNESIUM: Magnesium: 1.5 mg/dL — ABNORMAL LOW (ref 1.7–2.4)

## 2020-10-18 MED ORDER — METOPROLOL SUCCINATE ER 50 MG PO TB24: 75.0000 mg | ORAL_TABLET | Freq: Every day | ORAL | 0 refills | Status: DC

## 2020-10-18 MED ORDER — METOPROLOL SUCCINATE ER 50 MG PO TB24
75.0000 mg | ORAL_TABLET | Freq: Every day | ORAL | Status: DC
  Administered 2020-10-18: 75 mg via ORAL
  Filled 2020-10-18: qty 1

## 2020-10-18 MED ORDER — AMLODIPINE BESYLATE 5 MG PO TABS
5.0000 mg | ORAL_TABLET | Freq: Every day | ORAL | Status: DC
  Administered 2020-10-18: 5 mg via ORAL
  Filled 2020-10-18: qty 1

## 2020-10-18 MED ORDER — HEPARIN BOLUS VIA INFUSION
1000.0000 [IU] | Freq: Once | INTRAVENOUS | Status: AC
  Administered 2020-10-18: 1000 [IU] via INTRAVENOUS
  Filled 2020-10-18: qty 1000

## 2020-10-18 MED ORDER — MAGNESIUM SULFATE 2 GM/50ML IV SOLN
2.0000 g | Freq: Once | INTRAVENOUS | Status: AC
  Administered 2020-10-18: 2 g via INTRAVENOUS
  Filled 2020-10-18: qty 50

## 2020-10-18 MED ORDER — PERFLUTREN LIPID MICROSPHERE
1.0000 mL | INTRAVENOUS | Status: AC | PRN
  Administered 2020-10-18: 3 mL via INTRAVENOUS
  Filled 2020-10-18: qty 10

## 2020-10-18 NOTE — Progress Notes (Signed)
Progress Note  Patient Name: Geniyah Eischeid Date of Encounter: 10/18/2020  [facility] Cardiologist: Dr. Skeet Latch  Subjective   No chest pain or shortness of breath this morning. Converted to normal sinus rhythm  Inpatient Medications    Scheduled Meds: . allopurinol  100 mg Oral Daily  . amLODipine  5 mg Oral Daily  . atorvastatin  20 mg Oral Daily  . colchicine  0.6 mg Oral Daily  . metoprolol succinate  75 mg Oral Daily  . pantoprazole  40 mg Oral Daily  . potassium chloride  40 mEq Oral Daily   Continuous Infusions: . sodium chloride Stopped (10/17/20 1900)   PRN Meds: acetaminophen, ondansetron (ZOFRAN) IV   Vital Signs    Vitals:   10/18/20 0300 10/18/20 0400 10/18/20 0500 10/18/20 0600  BP: 124/90 121/85 126/73 123/62  Pulse:  62    Resp:  20 16 16   Temp:    98.5 F (36.9 C)  TempSrc:    Oral  SpO2:      Weight:   57.2 kg   Height:        Intake/Output Summary (Last 24 hours) at 10/18/2020 0827 Last data filed at 10/18/2020 0500 Gross per 24 hour  Intake 562 ml  Output 800 ml  Net -238 ml   Last 3 Weights 10/18/2020 10/17/2020 10/17/2020  Weight (lbs) 126 lb 1.7 oz 126 lb 1.7 oz 126 lb 5.2 oz  Weight (kg) 57.2 kg 57.2 kg 57.3 kg      Telemetry    Normal sinus rhythm- Personally Reviewed  ECG    Not performed today- Personally Reviewed  Physical Exam   GEN: No acute distress.   Neck: No JVD Cardiac: RRR, no murmurs, rubs, or gallops.  Respiratory: Clear to auscultation bilaterally. GI: Soft, nontender, non-distended  MS: No edema; No deformity. Neuro:  Nonfocal  Psych: Normal affect   Labs    High Sensitivity Troponin:   Recent Labs  Lab 10/17/20 1055 10/17/20 1352  TROPONINIHS 69* 80*      Chemistry Recent Labs  Lab 10/17/20 1055 10/18/20 0208  NA 141 139  K 2.9* 4.2  CL 108 110  CO2 21* 18*  GLUCOSE 131* 100*  BUN 30* 20  CREATININE 1.55* 1.06*  CALCIUM 9.0 8.7*  GFRNONAA 33* 53*  ANIONGAP 12 11      Hematology Recent Labs  Lab 10/17/20 1055 10/18/20 0208  WBC 8.2 9.9  RBC 4.24 3.69*  HGB 13.6 12.1  HCT 41.9 35.0*  MCV 98.8 94.9  MCH 32.1 32.8  MCHC 32.5 34.6  RDW 12.8 12.8  PLT 163 162    BNPNo results for input(s): BNP, PROBNP in the last 168 hours.   DDimer No results for input(s): DDIMER in the last 168 hours.   Radiology    CT HEAD WO CONTRAST  Result Date: 10/17/2020 CLINICAL DATA:  Syncope, recurrent. EXAM: CT HEAD WITHOUT CONTRAST TECHNIQUE: Contiguous axial images were obtained from the base of the skull through the vertex without intravenous contrast. COMPARISON:  CT head 10/17/2002. FINDINGS: Brain: Cerebral ventricle sizes are concordant with the degree of cerebral volume loss. Loss No evidence of large-territorial acute infarction. No parenchymal hemorrhage. Nonspecific bilateral basal ganglia mineralization. No mass lesion. No extra-axial collection. No mass effect or midline shift. No hydrocephalus. Basilar cisterns are patent. Vascular: No hyperdense vessel. Atherosclerotic calcifications are present within the cavernous internal carotid and vertebral arteries. Skull: No acute fracture or focal lesion. Sinuses/Orbits: Mucosal thickening of the right  sphenoid sinus noted. Paranasal sinuses and mastoid air cells are clear. The orbits are unremarkable. Other: None. IMPRESSION: No acute intracranial abnormality. Electronically Signed   By: Iven Finn M.D.   On: 10/17/2020 16:40   DG Chest Portable 1 View  Result Date: 10/17/2020 CLINICAL DATA:  Palpitations. EXAM: PORTABLE CHEST 1 VIEW COMPARISON:  11/21/2012 FINDINGS: 1126 hours. The cardio pericardial silhouette is enlarged. Right lung is clear. Streaky retrocardiac opacity suggests atelectasis. No pulmonary edema or dense focal airspace consolidation. No pleural effusion. Telemetry leads overlie the chest. IMPRESSION: Cardiomegaly with left base atelectasis. Electronically Signed   By: Misty Stanley M.D.    On: 10/17/2020 11:42    Cardiac Studies   2D echo pending  Patient Profile     79 y.o. female visiting her daughter for the holidays from [place] who presented with near syncope and A. fib with RVR.  Assessment & Plan    1: PAF-patient presented with A. fib with RVR with heart rates in the 190s. She had presyncope. She was also hypokalemic. She was placed on IV heparin and IV diltiazem. She is converted back to sinus rhythm. Given her recent history of falls, I do not feel she is a good candidate for oral anticoagulation. 2D echo is pending.  2: Hyperlipidemia-history of hyperlipidemia on atorvastatin with lipid profile today revealing total cholesterol 162, LDL 79 and HDL 53.  3: Essential hypertension-on losartan, amlodipine and beta-blocker at home. Her losartan has been held for renal insufficiency. Her blood pressures have been under good control. We will continue to hold her losartan, and increase her beta-blocker and continue her amlodipine for now.  4: Hypokalemia-potassium repleted, K is 4.2 this morning  5: CKD-serum creatinine has fallen from 1.5 down to 1.06. I suspect this is related to dehydration  6: Elevated troponin-troponin peaked at 80. She had no chest pain. I suspect this was related to demand ischemia from A. fib with RVR. Doubt ACS.  We will get 2D echo. If this is unremarkable okay for discharge home on increased dose of beta-blocker and amlodipine. Hold losartan for now. Would not treat with DOAC given recent falls. Patient needs to obtain a cardiologist when she returns to [place].  For questions or updates, please contact [place] Please consult [email or web address] for contact info under        Signed, Quay Burow, MD  10/18/2020, 8:27 AM

## 2020-10-18 NOTE — Plan of Care (Signed)
  Problem: Clinical Measurements: Goal: Respiratory complications will improve Outcome: Progressing   Problem: Clinical Measurements: Goal: Cardiovascular complication will be avoided Outcome: Progressing   Problem: Clinical Measurements: Goal: Diagnostic test results will improve Outcome: Progressing   Problem: Safety: Goal: Ability to remain free from injury will improve Outcome: Progressing   Problem: Cardiac: Goal: Will achieve and/or maintain adequate cardiac output Outcome: Progressing   Problem: Physical Regulation: Goal: Complications related to the disease process, condition or treatment will be avoided or minimized Outcome: Progressing

## 2020-10-18 NOTE — Discharge Instructions (Signed)
Medication Changes: - STOP Losartan. - INCREASE Metoprolol succinate (Toprol-XL) to 75mg  daily.  - Please continue all other medications as described elsewhere on discharge paperwork.   Please monitor your blood pressure and heart rate at home with the above medication changes. It is very important that you get established with a Cardiologist when you get back to [place].

## 2020-10-18 NOTE — Progress Notes (Signed)
ANTICOAGULATION CONSULT NOTE - Follow Up Consult  Pharmacy Consult for heparin Indication: atrial fibrillation  Labs: Recent Labs    10/17/20 1055 10/17/20 1352 10/18/20 0208  HGB 13.6  --  12.1  HCT 41.9  --  35.0*  PLT 163  --  162  HEPARINUNFRC  --   --  0.19*  CREATININE 1.55*  --   --   TROPONINIHS 69* 80*  --     Assessment: 79yo female subtherapeutic on heparin with initial dosing for Afib; no gtt issues or signs of bleeding per RN.  Goal of Therapy:  Heparin level 0.3-0.7 units/ml   Plan:  Will give small heparin bolus of 1000 units and increase heparin gtt by 3 units/kg/hr to 800 units/hr and check level in 8 hours.    Wynona Neat, PharmD, BCPS  10/18/2020,3:08 AM

## 2020-10-18 NOTE — Progress Notes (Signed)
  Echocardiogram 2D Echocardiogram has been performed with [facility].  Maria Travis 10/18/2020, 9:48 AM

## 2020-10-18 NOTE — Discharge Summary (Signed)
Discharge Summary    Patient ID: Maria Travis MRN: [id number]; DOB: 1940-12-31  Admit date: 10/17/2020 Discharge date: 10/18/2020  Primary Care Provider: Pcp, No  Primary Cardiologist: Patient lives in [place] and is just here visit family for the holidays. Primary Electrophysiologist:  None   Discharge Diagnoses    Principal Problem:   Paroxysmal atrial fibrillation (HCC) Active Problems:   Hypokalemia   Elevated troponin   Hypertension   Hyperlipidemia   AKI (acute kidney injury) [facility])    Diagnostic Studies/Procedures    Echocardiogram 10/18/2020: Impressions: 1. Left ventricular ejection fraction, by estimation, is 55 to 60%. The  left ventricle has normal function. The left ventricle has no regional  wall motion abnormalities. There is mild concentric left ventricular  hypertrophy. Left ventricular diastolic parameters were normal.  2. Right ventricular systolic function is normal. The right ventricular  size is normal. There is moderately elevated pulmonary artery systolic  pressure. The estimated right ventricular systolic pressure is 123XX123 mmHg.  3. The mitral valve is degenerative. Mild mitral valve regurgitation. No  evidence of mitral stenosis.  4. Tricuspid valve regurgitation is moderate to severe.  5. The aortic valve is tricuspid. There is moderate calcification of the  aortic valve. There is moderate thickening of the aortic valve. Aortic  valve regurgitation is moderate. Mild to moderate aortic valve  sclerosis/calcification is present, without  any evidence of aortic stenosis. Aortic valve mean gradient measures 4.0  mmHg.  6. The inferior vena cava is normal in size with greater than 50%  respiratory variability, suggesting right atrial pressure of 3 mmHg.  _____________   History of Present Illness     Maria Travis is a 79 y.o. 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  Patient presented to [facility] ED on 10/17/2020 after 2 witnessed syncopal episodes over the prior 2 days. She is visiting from [place] where she lives independently. Patient reported she had a near syncopal episode while getting ready in her bathroom on the day prior to presentation. She noted feeling mildly dizzy; however, no palpitations or racing heart. She reported that she did not lose consciousness and rather caught herself on the side of the sink.  On morning of presentation, daughter reported that they had walked up a flight of stairs at which time the patient was grabbing the handrail on the walls. Upon walking into the bedroom, the patient leaned over and was holding the dresser. When prompted for response by her daughter, the patient just had a glazed, blank stare with no response. The daughter then stood behind her and lowered her to the floor.  This episode lasted several minutes in duration. EMS was called for transport to the ED for further evaluation. HPI slightly difficult to obtain given poor event recall. Patient denied any episode of chest pain, palpitations, shortness of breath, lower extremity edema, orthopnea, nausea, or vomiting.  She could not go into much detail regarding prior episodes.  Daughter gave most details of the details. She has no prior history of CAD and has never undergone ischemic evaluation.  She has no prior history of palpitations or atrial fibrillation. Daughter reported several other falls which have occurred while the patient was in [place]; however, she does not know the history behind the falls.  There was no injury.   On EMS arrival, heart rate was found to be in the 190's; however, they improved to the 80's to 120's after IV fluids. EKG showed new  onset atrial fibrillation with RVR.  Patient was placed on a Diltiazem infusion with some improvement.  Potassium was 2.9 and was replaced in the ED. High-sensitivity troponin found to be mildly elevated at 69 with a repeat at 80; however, in the absence of chest pain and no prior cardiac history, elevation likely in the setting of RVR. Chest x-ray showed cardiomegaly with left base atelectasis. Head CT was ordered to rule out stroke given patient's inability to provide a detailed history and this showed no acute intracranial abnormalities.   Hospital Course     Consultants: None  Paroxysmal Atrial Fibrillation Rates initially in the 190's. She had presyncopal/possible syncopal episode with this. She was noted to be hypokalemic with potassium of 2.9 and this was repleted. Magnesium 1.5 today. Patient was given 2g IV mag sulfate to replete. TSH normal 1.288. Per daughter, patient has poor PO intake which likely contributed to electrolyte abnormalities and atrial fibrillation. She was started on IV Cardizem and thankfully converted back to normal sinus rhythm. Echo showed LVEF of 55-60% with normal wall motion, mild LVH, normal diastolic dysfunction, mild MR, moderate to severe TR, moderate AI, and moderately elevated PASP. Will increase home Toprol-XL to 75mg  once daily. CHA2DS2-VASC = 4 (HTN, age x2, female). Patient was started on IV Heparin during admission. However, will not discharge patient on oral anticoagulation given history of falls and concern for bleeding. She will need to get established with a Cardiologist where she lives in [place] for further discussion of this. Would also consider outpatient monitor for further evaluation of presyncope/syncope and to assess atrial fibrillation burden.   Addendum: discharge APP verified telemetry on 10/18/2020 @ 1:26PM, patient is currently in sinus rhythm with clear P waves.   Elevated Troponin High-sensitivity troponin mildly elevated at 69 >> 80. Echo  showed normal EF and normal wall motion. Patient denied any chest pain. Felt to be secondary to atrial fibrillation with RVR and not ACS.  Hypertension BP mildly elevated at times but mostly well controlled. Will stop home Losartan given AKI on admission. Will increase home Toprol-XL as above and restart home Amlodipine 5mg  daily.   Hyperlipidemia Lipid panel this admission: Total Cholesterol 162, Triglycerides 148, HDL 53, LDL 79. Continue home Lipitor 20mg  daily.   Hypokalemia/Hypomagnesium Potassium initially 2.9 on admission. This was repleted and 4.2 on day of discharge. Magnesium 1.5 today. Will replete prior to discharge. Recommend repeat BMET and Magnesium at follow-up visit.  AKI Creatinine 1.51. Remote baseline 7 years ago was around 0.8. Likely due to poor PO intake. Home Losartan was held. Creatinine improved to 1.06 on day of discharge. Recommend repeat BMET at follow-up visit.  Patient seen and examined by Dr. Gwenlyn Found today and determined to be stable for discharge. Patient lives in [place] and is going back home early next week after the Christmas holiday. She will need to get established with Cardiologist in [place]. Medications as below.   Did the patient have an acute coronary syndrome (MI, NSTEMI, STEMI, etc) this admission?:  No.   The elevated Troponin was due to the acute medical illness (demand ischemia).      _____________  Discharge Vitals Blood pressure 129/71, pulse 76, temperature 98.5 F (36.9 C), temperature source Oral, resp. rate (!) 25, height 5' (1.524 m), weight 57.2 kg, SpO2 99 %.  Filed Weights   10/17/20 1815 10/17/20 1930 10/18/20 0500  Weight: 57.3 kg 57.2 kg 57.2 kg    Labs & Radiologic Studies    CBC Recent Labs    10/17/20 1055 10/18/20 0208  WBC 8.2 9.9  HGB 13.6 12.1  HCT 41.9 35.0*  MCV 98.8 94.9  PLT 163 0000000   Basic Metabolic Panel Recent Labs    10/17/20 1055 10/18/20 0208 10/18/20 1116  NA 141 139  --   K 2.9* 4.2  --    CL 108 110  --   CO2 21* 18*  --   GLUCOSE 131* 100*  --   BUN 30* 20  --   CREATININE 1.55* 1.06*  --   CALCIUM 9.0 8.7*  --   MG  --   --  1.5*   Liver Function Tests No results for input(s): AST, ALT, ALKPHOS, BILITOT, PROT, ALBUMIN in the last 72 hours. No results for input(s): LIPASE, AMYLASE in the last 72 hours. High Sensitivity Troponin:   Recent Labs  Lab 10/17/20 1055 10/17/20 1352  TROPONINIHS 69* 80*    BNP Invalid input(s): POCBNP D-Dimer No results for input(s): DDIMER in the last 72 hours. Hemoglobin A1C No results for input(s): HGBA1C in the last 72 hours. Fasting Lipid Panel Recent Labs    10/18/20 0208  CHOL 162  HDL 53  LDLCALC 79  TRIG 148  CHOLHDL 3.1   Thyroid Function Tests Recent Labs    10/18/20 1116  TSH 1.288   _____________  CT HEAD WO CONTRAST  Result Date: 10/17/2020 CLINICAL DATA:  Syncope, recurrent. EXAM: CT HEAD WITHOUT CONTRAST TECHNIQUE: Contiguous axial images were obtained from the base of the skull through the vertex without intravenous contrast. COMPARISON:  CT head 10/17/2002. FINDINGS: Brain: Cerebral ventricle sizes are concordant with the degree of cerebral volume loss. Loss No evidence of large-territorial acute infarction. No parenchymal hemorrhage. Nonspecific bilateral basal ganglia mineralization. No mass lesion. No extra-axial collection. No mass effect or midline shift. No hydrocephalus. Basilar cisterns are patent. Vascular: No hyperdense vessel. Atherosclerotic calcifications are present within the cavernous internal carotid and vertebral arteries. Skull: No acute fracture or focal lesion. Sinuses/Orbits: Mucosal thickening of the right sphenoid sinus noted. Paranasal sinuses and mastoid air cells are clear. The orbits are unremarkable. Other: None. IMPRESSION: No acute intracranial abnormality. Electronically Signed   By: Iven Finn M.D.   On: 10/17/2020 16:40   DG Chest Portable 1 View  Result Date:  10/17/2020 CLINICAL DATA:  Palpitations. EXAM: PORTABLE CHEST 1 VIEW COMPARISON:  11/21/2012 FINDINGS: 1126 hours. The cardio pericardial silhouette is enlarged. Right lung is clear. Streaky retrocardiac opacity suggests atelectasis. No pulmonary edema or dense focal airspace consolidation. No pleural effusion. Telemetry leads overlie the chest. IMPRESSION: Cardiomegaly with left base atelectasis. Electronically Signed   By: Misty Stanley M.D.   On: 10/17/2020 11:42   ECHOCARDIOGRAM COMPLETE  Result Date: 10/18/2020    ECHOCARDIOGRAM REPORT   Patient Name:   CAMEISHA WIECEK Date of Exam: 10/18/2020 Medical Rec #:  [id number]        Height:       60.0 in Accession #:    [id number]       Weight:       126.1 lb Date of Birth:  10-28-1940        BSA:          1.534 m Patient Age:    80 years         BP:  123/62 mmHg Patient Gender: F                HR:           74 bpm. Exam Location:  Inpatient Procedure: 2D Echo, Cardiac Doppler, Color Doppler and Intracardiac            Opacification Agent Indications:    Atrial fibrillation  History:        Patient has no prior history of Echocardiogram examinations.                 Risk Factors:Hypertension and Dyslipidemia. CKD.  Sonographer:    Clayton Lefort RDCS (AE) Referring Phys: [phone] Alphonsa Overall MCDANIEL  Sonographer Comments: Suboptimal apical window. IMPRESSIONS  1. Left ventricular ejection fraction, by estimation, is 55 to 60%. The left ventricle has normal function. The left ventricle has no regional wall motion abnormalities. There is mild concentric left ventricular hypertrophy. Left ventricular diastolic parameters were normal.  2. Right ventricular systolic function is normal. The right ventricular size is normal. There is moderately elevated pulmonary artery systolic pressure. The estimated right ventricular systolic pressure is 31.5 mmHg.  3. The mitral valve is degenerative. Mild mitral valve regurgitation. No evidence of mitral stenosis.  4. Tricuspid  valve regurgitation is moderate to severe.  5. The aortic valve is tricuspid. There is moderate calcification of the aortic valve. There is moderate thickening of the aortic valve. Aortic valve regurgitation is moderate. Mild to moderate aortic valve sclerosis/calcification is present, without any evidence of aortic stenosis. Aortic valve mean gradient measures 4.0 mmHg.  6. The inferior vena cava is normal in size with greater than 50% respiratory variability, suggesting right atrial pressure of 3 mmHg. FINDINGS  Left Ventricle: Left ventricular ejection fraction, by estimation, is 55 to 60%. The left ventricle has normal function. The left ventricle has no regional wall motion abnormalities. Definity contrast agent was given IV to delineate the left ventricular  endocardial borders. The left ventricular internal cavity size was normal in size. There is mild concentric left ventricular hypertrophy. Left ventricular diastolic parameters were normal. Right Ventricle: The right ventricular size is normal. No increase in right ventricular wall thickness. Right ventricular systolic function is normal. There is moderately elevated pulmonary artery systolic pressure. The tricuspid regurgitant velocity is 3.23 m/s, and with an assumed right atrial pressure of 15 mmHg, the estimated right ventricular systolic pressure is 40.0 mmHg. Left Atrium: Left atrial size was normal in size. Right Atrium: Right atrial size was normal in size. Pericardium: There is no evidence of pericardial effusion. Mitral Valve: The mitral valve is degenerative in appearance. There is mild thickening of the mitral valve leaflet(s). There is mild calcification of the mitral valve leaflet(s). Mild mitral annular calcification. Mild mitral valve regurgitation. No evidence of mitral valve stenosis. MV peak gradient, 3.9 mmHg. The mean mitral valve gradient is 1.0 mmHg. Tricuspid Valve: The tricuspid valve is normal in structure. Tricuspid valve  regurgitation is moderate to severe. No evidence of tricuspid stenosis. Aortic Valve: The aortic valve is tricuspid. There is moderate calcification of the aortic valve. There is moderate thickening of the aortic valve. Aortic valve regurgitation is moderate. Aortic regurgitation PHT measures 376 msec. Mild to moderate aortic valve sclerosis/calcification is present, without any evidence of aortic stenosis. Aortic valve mean gradient measures 4.0 mmHg. Aortic valve peak gradient measures 7.5 mmHg. Aortic valve area, by VTI measures 1.21 cm. Pulmonic Valve: The pulmonic valve was normal in structure. Pulmonic valve regurgitation is  not visualized. No evidence of pulmonic stenosis. Aorta: The aortic root is normal in size and structure. Venous: The inferior vena cava is normal in size with greater than 50% respiratory variability, suggesting right atrial pressure of 3 mmHg. IAS/Shunts: No atrial level shunt detected by color flow Doppler.  LEFT VENTRICLE PLAX 2D LVIDd:         3.80 cm  Diastology LVIDs:         3.00 cm  LV e' medial:    7.72 cm/s LV PW:         0.90 cm  LV E/e' medial:  12.8 LV IVS:        1.10 cm  LV e' lateral:   7.62 cm/s LVOT diam:     1.70 cm  LV E/e' lateral: 13.0 LV SV:         32 LV SV Index:   21 LVOT Area:     2.27 cm  RIGHT VENTRICLE             IVC RV Basal diam:  3.00 cm     IVC diam: 2.50 cm RV S prime:     12.10 cm/s TAPSE (M-mode): 1.6 cm LEFT ATRIUM             Index       RIGHT ATRIUM           Index LA diam:        3.40 cm 2.22 cm/m  RA Area:     12.00 cm LA Vol (A2C):   38.6 ml 25.16 ml/m RA Volume:   20.50 ml  13.36 ml/m LA Vol (A4C):   47.0 ml 30.63 ml/m LA Biplane Vol: 43.8 ml 28.55 ml/m  AORTIC VALVE AV Area (Vmax):    1.29 cm AV Area (Vmean):   1.22 cm AV Area (VTI):     1.21 cm AV Vmax:           137.00 cm/s AV Vmean:          91.400 cm/s AV VTI:            0.264 m AV Peak Grad:      7.5 mmHg AV Mean Grad:      4.0 mmHg LVOT Vmax:         77.80 cm/s LVOT Vmean:         49.200 cm/s LVOT VTI:          0.140 m LVOT/AV VTI ratio: 0.53 AI PHT:            376 msec  AORTA Ao Root diam: 3.20 cm Ao Asc diam:  3.40 cm MITRAL VALVE               TRICUSPID VALVE MV Area (PHT): 3.17 cm    TR Peak grad:   41.7 mmHg MV Peak grad:  3.9 mmHg    TR Vmax:        323.00 cm/s MV Mean grad:  1.0 mmHg MV Vmax:       0.99 m/s    SHUNTS MV Vmean:      51.6 cm/s   Systemic VTI:  0.14 m MV Decel Time: 239 msec    Systemic Diam: 1.70 cm MV E velocity: 99.00 cm/s MV A velocity: 44.10 cm/s MV E/A ratio:  2.24 Ena Dawley MD Electronically signed by Ena Dawley MD Signature Date/Time: 10/18/2020/11:46:12 AM    Final    Disposition   Patient is being discharged home today in good  condition.  Follow-up Plans & Appointments     Follow-up Information    Cardiology Follow up.   Why: It is important that you get established with a Cardiology when you get back home.              Discharge Instructions    Diet - low sodium heart healthy   Complete by: As directed    Increase activity slowly   Complete by: As directed       Discharge Medications   Allergies as of 10/18/2020   No Known Allergies     Medication List    STOP taking these medications   benzonatate 100 MG capsule Commonly known as: TESSALON   losartan 50 MG tablet Commonly known as: COZAAR   predniSONE 20 MG tablet Commonly known as: DELTASONE     TAKE these medications   allopurinol 100 MG tablet Commonly known as: ZYLOPRIM Take 100 mg by mouth daily.   amLODipine 5 MG tablet Commonly known as: NORVASC Take 5 mg by mouth daily.   atorvastatin 20 MG tablet Commonly known as: LIPITOR Take 20 mg by mouth daily.   colchicine 0.6 MG tablet Take 0.6 mg by mouth daily.   metoprolol succinate 50 MG 24 hr tablet Commonly known as: TOPROL-XL Take 1.5 tablets (75 mg total) by mouth daily. Take with or immediately following a meal. Start taking on: October 19, 2020 What changed: how much to  take   pantoprazole 40 MG tablet Commonly known as: PROTONIX Take 40 mg by mouth daily.          Outstanding Labs/Studies   Repeat BMET at follow-up visit.  Duration of Discharge Encounter   Greater than 30 minutes including physician time.  Hilbert Corrigan, [place] 10/18/2020, 1:27 PM

## 2020-12-02 ENCOUNTER — Inpatient Hospital Stay (HOSPITAL_COMMUNITY)
Admission: EM | Admit: 2020-12-02 | Discharge: 2020-12-08 | DRG: 280 | Disposition: A | Discharge status: Home Health | Payer: Medicare Other | Attending: Internal Medicine | Admitting: Internal Medicine

## 2020-12-02 ENCOUNTER — Emergency Department (HOSPITAL_COMMUNITY): Payer: Medicare Other

## 2020-12-02 ENCOUNTER — Other Ambulatory Visit: Payer: Self-pay

## 2020-12-02 DIAGNOSIS — R778 Other specified abnormalities of plasma proteins: Secondary | ICD-10-CM | POA: Diagnosis present

## 2020-12-02 DIAGNOSIS — N179 Acute kidney failure, unspecified: Secondary | ICD-10-CM | POA: Diagnosis not present

## 2020-12-02 DIAGNOSIS — E7800 Pure hypercholesterolemia, unspecified: Secondary | ICD-10-CM | POA: Diagnosis present

## 2020-12-02 DIAGNOSIS — I4819 Other persistent atrial fibrillation: Principal | ICD-10-CM | POA: Diagnosis present

## 2020-12-02 DIAGNOSIS — Q211 Atrial septal defect: Secondary | ICD-10-CM | POA: Diagnosis not present

## 2020-12-02 DIAGNOSIS — R4189 Other symptoms and signs involving cognitive functions and awareness: Secondary | ICD-10-CM | POA: Diagnosis present

## 2020-12-02 DIAGNOSIS — K529 Noninfective gastroenteritis and colitis, unspecified: Secondary | ICD-10-CM | POA: Diagnosis present

## 2020-12-02 DIAGNOSIS — I340 Nonrheumatic mitral (valve) insufficiency: Secondary | ICD-10-CM | POA: Diagnosis not present

## 2020-12-02 DIAGNOSIS — I350 Nonrheumatic aortic (valve) stenosis: Secondary | ICD-10-CM | POA: Diagnosis not present

## 2020-12-02 DIAGNOSIS — R55 Syncope and collapse: Secondary | ICD-10-CM | POA: Diagnosis present

## 2020-12-02 DIAGNOSIS — Z20822 Contact with and (suspected) exposure to covid-19: Secondary | ICD-10-CM | POA: Diagnosis present

## 2020-12-02 DIAGNOSIS — I110 Hypertensive heart disease with heart failure: Secondary | ICD-10-CM | POA: Diagnosis present

## 2020-12-02 DIAGNOSIS — D259 Leiomyoma of uterus, unspecified: Secondary | ICD-10-CM | POA: Diagnosis present

## 2020-12-02 DIAGNOSIS — E785 Hyperlipidemia, unspecified: Secondary | ICD-10-CM | POA: Diagnosis present

## 2020-12-02 DIAGNOSIS — I10 Essential (primary) hypertension: Secondary | ICD-10-CM

## 2020-12-02 DIAGNOSIS — Z9049 Acquired absence of other specified parts of digestive tract: Secondary | ICD-10-CM

## 2020-12-02 DIAGNOSIS — R296 Repeated falls: Secondary | ICD-10-CM | POA: Diagnosis present

## 2020-12-02 DIAGNOSIS — K8689 Other specified diseases of pancreas: Secondary | ICD-10-CM | POA: Diagnosis present

## 2020-12-02 DIAGNOSIS — Z7901 Long term (current) use of anticoagulants: Secondary | ICD-10-CM

## 2020-12-02 DIAGNOSIS — E278 Other specified disorders of adrenal gland: Secondary | ICD-10-CM | POA: Diagnosis present

## 2020-12-02 DIAGNOSIS — I5041 Acute combined systolic (congestive) and diastolic (congestive) heart failure: Secondary | ICD-10-CM | POA: Diagnosis not present

## 2020-12-02 DIAGNOSIS — I50813 Acute on chronic right heart failure: Secondary | ICD-10-CM | POA: Diagnosis not present

## 2020-12-02 DIAGNOSIS — Z6822 Body mass index (BMI) 22.0-22.9, adult: Secondary | ICD-10-CM

## 2020-12-02 DIAGNOSIS — I214 Non-ST elevation (NSTEMI) myocardial infarction: Secondary | ICD-10-CM | POA: Diagnosis present

## 2020-12-02 DIAGNOSIS — I5033 Acute on chronic diastolic (congestive) heart failure: Secondary | ICD-10-CM

## 2020-12-02 DIAGNOSIS — I4891 Unspecified atrial fibrillation: Secondary | ICD-10-CM | POA: Diagnosis not present

## 2020-12-02 DIAGNOSIS — Z79899 Other long term (current) drug therapy: Secondary | ICD-10-CM | POA: Diagnosis not present

## 2020-12-02 DIAGNOSIS — K838 Other specified diseases of biliary tract: Secondary | ICD-10-CM | POA: Diagnosis present

## 2020-12-02 DIAGNOSIS — I5021 Acute systolic (congestive) heart failure: Secondary | ICD-10-CM

## 2020-12-02 DIAGNOSIS — R630 Anorexia: Secondary | ICD-10-CM | POA: Diagnosis present

## 2020-12-02 DIAGNOSIS — I428 Other cardiomyopathies: Secondary | ICD-10-CM | POA: Diagnosis present

## 2020-12-02 DIAGNOSIS — Z933 Colostomy status: Secondary | ICD-10-CM

## 2020-12-02 DIAGNOSIS — I5043 Acute on chronic combined systolic (congestive) and diastolic (congestive) heart failure: Secondary | ICD-10-CM | POA: Diagnosis present

## 2020-12-02 DIAGNOSIS — R109 Unspecified abdominal pain: Secondary | ICD-10-CM | POA: Diagnosis not present

## 2020-12-02 DIAGNOSIS — I072 Rheumatic tricuspid stenosis and insufficiency: Secondary | ICD-10-CM | POA: Diagnosis present

## 2020-12-02 DIAGNOSIS — R059 Cough, unspecified: Secondary | ICD-10-CM

## 2020-12-02 HISTORY — DX: Unspecified atrial fibrillation: I48.91

## 2020-12-02 HISTORY — DX: Diverticulitis of intestine, part unspecified, without perforation or abscess without bleeding: K57.92

## 2020-12-02 LAB — CBC WITH DIFFERENTIAL/PLATELET
Abs Immature Granulocytes: 0.02 10*3/uL (ref 0.00–0.07)
Basophils Absolute: 0 10*3/uL (ref 0.0–0.1)
Basophils Relative: 0 %
Eosinophils Absolute: 0.2 10*3/uL (ref 0.0–0.5)
Eosinophils Relative: 2 %
HCT: 37.8 % (ref 36.0–46.0)
Hemoglobin: 12.2 g/dL (ref 12.0–15.0)
Immature Granulocytes: 0 %
Lymphocytes Relative: 41 %
Lymphs Abs: 3.2 10*3/uL (ref 0.7–4.0)
MCH: 31.6 pg (ref 26.0–34.0)
MCHC: 32.3 g/dL (ref 30.0–36.0)
MCV: 97.9 fL (ref 80.0–100.0)
Monocytes Absolute: 0.7 10*3/uL (ref 0.1–1.0)
Monocytes Relative: 9 %
Neutro Abs: 3.7 10*3/uL (ref 1.7–7.7)
Neutrophils Relative %: 48 %
Platelets: 204 10*3/uL (ref 150–400)
RBC: 3.86 MIL/uL — ABNORMAL LOW (ref 3.87–5.11)
RDW: 15.4 % (ref 11.5–15.5)
WBC: 7.8 10*3/uL (ref 4.0–10.5)
nRBC: 0.3 % — ABNORMAL HIGH (ref 0.0–0.2)

## 2020-12-02 LAB — URINALYSIS, ROUTINE W REFLEX MICROSCOPIC
Bilirubin Urine: NEGATIVE
Glucose, UA: NEGATIVE mg/dL
Hgb urine dipstick: NEGATIVE
Ketones, ur: NEGATIVE mg/dL
Leukocytes,Ua: NEGATIVE
Nitrite: NEGATIVE
Protein, ur: NEGATIVE mg/dL
Specific Gravity, Urine: 1.023 (ref 1.005–1.030)
pH: 5 (ref 5.0–8.0)

## 2020-12-02 LAB — CBC
HCT: 35.4 % — ABNORMAL LOW (ref 36.0–46.0)
Hemoglobin: 10.8 g/dL — ABNORMAL LOW (ref 12.0–15.0)
MCH: 31.1 pg (ref 26.0–34.0)
MCHC: 30.5 g/dL (ref 30.0–36.0)
MCV: 102 fL — ABNORMAL HIGH (ref 80.0–100.0)
Platelets: 171 10*3/uL (ref 150–400)
RBC: 3.47 MIL/uL — ABNORMAL LOW (ref 3.87–5.11)
RDW: 15.4 % (ref 11.5–15.5)
WBC: 6.7 10*3/uL (ref 4.0–10.5)
nRBC: 0.4 % — ABNORMAL HIGH (ref 0.0–0.2)

## 2020-12-02 LAB — COMPREHENSIVE METABOLIC PANEL
ALT: 20 U/L (ref 0–44)
AST: 21 U/L (ref 15–41)
Albumin: 3.5 g/dL (ref 3.5–5.0)
Alkaline Phosphatase: 56 U/L (ref 38–126)
Anion gap: 9 (ref 5–15)
BUN: 15 mg/dL (ref 8–23)
CO2: 23 mmol/L (ref 22–32)
Calcium: 9.5 mg/dL (ref 8.9–10.3)
Chloride: 111 mmol/L (ref 98–111)
Creatinine, Ser: 1.17 mg/dL — ABNORMAL HIGH (ref 0.44–1.00)
GFR, Estimated: 47 mL/min — ABNORMAL LOW (ref >60)
Glucose, Bld: 115 mg/dL — ABNORMAL HIGH (ref 70–99)
Potassium: 3.8 mmol/L (ref 3.5–5.1)
Sodium: 143 mmol/L (ref 135–145)
Total Bilirubin: 0.8 mg/dL (ref 0.3–1.2)
Total Protein: 6.6 g/dL (ref 6.5–8.1)

## 2020-12-02 LAB — BRAIN NATRIURETIC PEPTIDE: B Natriuretic Peptide: 768.8 pg/mL — ABNORMAL HIGH (ref 0.0–100.0)

## 2020-12-02 LAB — CREATININE, SERUM
Creatinine, Ser: 0.96 mg/dL (ref 0.44–1.00)
GFR, Estimated: 59 mL/min — ABNORMAL LOW (ref >60)

## 2020-12-02 LAB — SARS CORONAVIRUS 2 (TAT 6-24 HRS): SARS Coronavirus 2: NEGATIVE

## 2020-12-02 LAB — LIPASE, BLOOD: Lipase: 42 U/L (ref 11–51)

## 2020-12-02 MED ORDER — FOLIC ACID 1 MG PO TABS
1.0000 mg | ORAL_TABLET | Freq: Every morning | ORAL | Status: DC
  Administered 2020-12-03 – 2020-12-08 (×5): 1 mg via ORAL
  Filled 2020-12-02 (×5): qty 1

## 2020-12-02 MED ORDER — FUROSEMIDE 10 MG/ML IJ SOLN
40.0000 mg | Freq: Two times a day (BID) | INTRAMUSCULAR | Status: DC
  Administered 2020-12-02 – 2020-12-07 (×11): 40 mg via INTRAVENOUS
  Filled 2020-12-02 (×12): qty 4

## 2020-12-02 MED ORDER — DILTIAZEM HCL-DEXTROSE 125-5 MG/125ML-% IV SOLN (PREMIX)
5.0000 mg/h | INTRAVENOUS | Status: DC
  Administered 2020-12-02: 5 mg/h via INTRAVENOUS
  Administered 2020-12-02: 10 mg/h via INTRAVENOUS
  Filled 2020-12-02 (×2): qty 125

## 2020-12-02 MED ORDER — POTASSIUM CHLORIDE ER 10 MEQ PO TBCR
10.0000 meq | EXTENDED_RELEASE_TABLET | Freq: Every morning | ORAL | Status: DC
  Administered 2020-12-03 – 2020-12-05 (×2): 10 meq via ORAL
  Filled 2020-12-02 (×5): qty 1

## 2020-12-02 MED ORDER — METOPROLOL SUCCINATE ER 25 MG PO TB24
75.0000 mg | ORAL_TABLET | Freq: Every day | ORAL | Status: DC
  Administered 2020-12-02: 75 mg via ORAL
  Filled 2020-12-02: qty 3

## 2020-12-02 MED ORDER — IOHEXOL 300 MG/ML  SOLN
80.0000 mL | Freq: Once | INTRAMUSCULAR | Status: AC | PRN
  Administered 2020-12-02: 80 mL via INTRAVENOUS

## 2020-12-02 MED ORDER — FENTANYL CITRATE (PF) 100 MCG/2ML IJ SOLN
12.5000 ug | Freq: Once | INTRAMUSCULAR | Status: AC
  Administered 2020-12-02: 12.5 ug via INTRAVENOUS
  Filled 2020-12-02: qty 2

## 2020-12-02 MED ORDER — DILTIAZEM LOAD VIA INFUSION
10.0000 mg | Freq: Once | INTRAVENOUS | Status: AC
  Administered 2020-12-02: 10 mg via INTRAVENOUS
  Filled 2020-12-02: qty 10

## 2020-12-02 MED ORDER — LOSARTAN POTASSIUM 25 MG PO TABS
25.0000 mg | ORAL_TABLET | Freq: Every morning | ORAL | Status: DC
  Administered 2020-12-03 – 2020-12-05 (×3): 25 mg via ORAL
  Filled 2020-12-02 (×3): qty 1

## 2020-12-02 MED ORDER — METOPROLOL TARTRATE 25 MG PO TABS: 25.0000 mg | ORAL_TABLET | Freq: Two times a day (BID) | ORAL | Status: DC

## 2020-12-02 MED ORDER — ACETAMINOPHEN 325 MG PO TABS: 650.0000 mg | ORAL_TABLET | ORAL | Status: DC | PRN

## 2020-12-02 MED ORDER — ALLOPURINOL 100 MG PO TABS
100.0000 mg | ORAL_TABLET | Freq: Every day | ORAL | Status: DC
Start: 2020-12-03 — End: 2020-12-08
  Administered 2020-12-03 – 2020-12-08 (×5): 100 mg via ORAL
  Filled 2020-12-02 (×5): qty 1

## 2020-12-02 MED ORDER — COLCHICINE 0.6 MG PO TABS
0.6000 mg | ORAL_TABLET | Freq: Every day | ORAL | Status: DC
  Administered 2020-12-03 – 2020-12-07 (×4): 0.6 mg via ORAL
  Filled 2020-12-02 (×4): qty 1

## 2020-12-02 MED ORDER — THIAMINE HCL 100 MG PO TABS
100.0000 mg | ORAL_TABLET | Freq: Every day | ORAL | Status: DC
  Administered 2020-12-03 – 2020-12-08 (×5): 100 mg via ORAL
  Filled 2020-12-02 (×5): qty 1

## 2020-12-02 MED ORDER — ONDANSETRON HCL 4 MG/2ML IJ SOLN: 4.0000 mg | Freq: Four times a day (QID) | INTRAMUSCULAR | Status: DC | PRN

## 2020-12-02 MED ORDER — ONDANSETRON HCL 4 MG/2ML IJ SOLN
4.0000 mg | Freq: Once | INTRAMUSCULAR | Status: AC
  Administered 2020-12-02: 4 mg via INTRAVENOUS
  Filled 2020-12-02: qty 2

## 2020-12-02 MED ORDER — APIXABAN 2.5 MG PO TABS
2.5000 mg | ORAL_TABLET | Freq: Two times a day (BID) | ORAL | Status: DC
  Filled 2020-12-02: qty 1

## 2020-12-02 MED ORDER — ATORVASTATIN CALCIUM 10 MG PO TABS
20.0000 mg | ORAL_TABLET | Freq: Every evening | ORAL | Status: DC
  Administered 2020-12-02 – 2020-12-07 (×6): 20 mg via ORAL
  Filled 2020-12-02 (×6): qty 2

## 2020-12-02 MED ORDER — SODIUM CHLORIDE 0.9 % IV SOLN: INTRAVENOUS | Status: DC

## 2020-12-02 MED ORDER — METRONIDAZOLE IN NACL 5-0.79 MG/ML-% IV SOLN
500.0000 mg | Freq: Once | INTRAVENOUS | Status: AC
  Administered 2020-12-02: 500 mg via INTRAVENOUS
  Filled 2020-12-02: qty 100

## 2020-12-02 MED ORDER — PANTOPRAZOLE SODIUM 40 MG PO TBEC
40.0000 mg | DELAYED_RELEASE_TABLET | Freq: Every day | ORAL | Status: DC
  Administered 2020-12-03 – 2020-12-08 (×5): 40 mg via ORAL
  Filled 2020-12-02 (×5): qty 1

## 2020-12-02 MED ORDER — CIPROFLOXACIN IN D5W 400 MG/200ML IV SOLN
400.0000 mg | Freq: Once | INTRAVENOUS | Status: AC
  Administered 2020-12-02: 400 mg via INTRAVENOUS
  Filled 2020-12-02 (×2): qty 200

## 2020-12-02 MED ORDER — ENOXAPARIN SODIUM 40 MG/0.4ML ~~LOC~~ SOLN: 40.0000 mg | SUBCUTANEOUS | Status: DC

## 2020-12-02 NOTE — ED Notes (Signed)
Admitting at bedside 

## 2020-12-02 NOTE — Hospital Course (Addendum)
Per patient  -SNF in [place]  -confusion as to how many falls this year   -denies nausea, diarrhea   Questions to ask Daughter -meds taking, compliance -fevers? -daughter -recent falls following December falls? -Where mom stays at [place] (self, SNF?) -Good intake in recent days -colostomy bag: Normal stool? -Has she followed with a Cardiologist in [place] for her Afib.  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 Delfina Redwood

## 2020-12-02 NOTE — H&P (Addendum)
Date: 12/02/2020               Patient Name:  Maria Travis MRN: [id number]  DOB: [birth year] Age / Sex: 80 y.o., female   PCP: Pcp, No              Medical Service: Internal Medicine Teaching Service              Attending Physician: Dr. Velna Ochs, MD    First Contact: Azell Der, [place] 3 Pager: [phone]  Second Contact: Dr. Liliane Channel Pager: [phone]  Third Contact Dr. 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 Patient notably a poor historian. Personal attempts to contact daughter unsuccessful thus far. History per patient and chart review. Reports intermittent sob in past weeks, denies orthopnea or disruption to sleep. Reports is ambulatory with a walker at baseline. Additionally, endorses has had a longstanding non-productive cough. She has tried a variety of OTC cough medications for this without relief. Patient reports sometimes has mild chest discomfort with coughing. Denies chest pain and sensation of palpitations. Has not noticed any lower extremity swelling.   She has been living in [place] following hospitalization in December, but was brought to this area by her daughter few days ago following additional falls. She reports her daughter has been managing her medications. She is unsure of what medications she takes. Patient has had abdominal pain, predominantly in the LLQ for  some time, with notable worsening in the past month. Conveys pain is severe and diffusely radiates, but does not go to back. Denies constipation, diarrhea, nausea, or vomiting. Endorses continued output per colostomy bag.    ED course Patient presented to ED by ambulance. Tachycardic at 147 on presentation with reduction to 100-110s, hypertensive in 120s-140s/90s-early 100s. Creatinine of 1.17, K 3.8, Bicarb 23, AST 21, ALT 20, Alk Phos 56, Total bilirubin 0.8. Normal lipase. WBC 7.8, Hgb 12.2. BNP of 768.8. Coronavirus negative. Urinalysis hazy; negative for nitrites and Leukocytes. Chest x-ray showed stable cardiomegaly; and a small right pleural effusion. CT Abd Pelvis significant for edematous mural thickening of terminal illeum and colon, fat and bowel containing ventral hernias without significant mechanical obstruction, anasarca, potential hepatic steatosis, borderline dilation of intra and extra hepatic biliary tree and pancreatic duct, hyperdense right adrenal nodule, uterine fibroids, and pelvic vasculature prominence   Meds:  Current Meds  Medication Sig  . allopurinol (ZYLOPRIM) 100 MG tablet Take 100 mg by mouth daily.  Marland Kitchen apixaban (ELIQUIS) 2.5 MG TABS tablet Take 2.5 mg by mouth 2 (two) times daily.  Marland Kitchen atorvastatin (LIPITOR) 20 MG tablet Take 20 mg by mouth every evening.  . colchicine 0.6 MG tablet Take 0.6 mg by mouth daily.  Marland Kitchen diltiazem (CARDIZEM CD) 240 MG 24 hr capsule Take 240 mg by mouth in the morning.  . folic acid (FOLVITE) 1 MG tablet Take 1 mg by mouth in the morning.  . furosemide (LASIX) 20  MG tablet Take 20 mg by mouth in the morning.  Marland Kitchen losartan (COZAAR) 25 MG tablet Take 25 mg by mouth in the morning.  . metoprolol succinate (TOPROL-XL) 50 MG 24 hr tablet Take 1.5 tablets (75 mg total) by mouth daily. Take with or immediately following a meal. (Patient taking differently: Take 75 mg by mouth at bedtime. Take with or immediately following a meal.)  . pantoprazole  (PROTONIX) 40 MG tablet Take 40 mg by mouth daily before breakfast.  . potassium chloride (KLOR-CON) 10 MEQ tablet Take 10 mEq by mouth in the morning.  . thiamine (VITAMIN B-1) 100 MG tablet Take 100 mg by mouth daily.     Allergies: Allergies as of 12/02/2020  . (No Known Allergies)   Past Medical History:  Diagnosis Date  . High cholesterol   . Hypertension     Family History:  n/a  Social History:  Patient reports distant history of social drinking. No alcohol use at present Denies hx of tobacco use Denies recreational drug use.  Patient from [place]. Currently living with her daughter, son-in-law, and granddaughter.   Review of Systems: A complete ROS was negative except as per HPI.  Physical Exam: Blood pressure (!) 150/115, pulse (!) 152, temperature (!) 97.4 F (36.3 C), temperature source Oral, resp. rate (!) 26, height 5' (1.524 m), weight 55.3 kg, SpO2 100 %.   Physical Exam Constitutional:      General: She is not in acute distress.    Appearance: She is not toxic-appearing or diaphoretic.     Comments: Elderly lady seen lying in bed. NAD. Conversant. Mildly increased wob. Able to speak in complete sentences. Intermittent coughing.   Cardiovascular:     Rate and Rhythm: Tachycardia present. Rhythm irregular.  Pulmonary:     Effort: No respiratory distress.     Comments: Diminished breath sounds Abdominal:     General: There is distension.     Palpations: Abdomen is soft.     Tenderness: There is abdominal tenderness.     Comments: Patient moderately tender to palpation in all quadrants. Colostomy bag in place, with ample soft stool without notable blood  Neurological:     Mental Status: She is alert.     EKG: personally reviewed my interpretation is Afib with significant RVR  CXR: personally reviewed my interpretation is Cardiomegaly and Right pleural effusion   Assessment & Plan by Problem: Active Problems:   Atrial fibrillation with RVR  (HCC)   Atrial Fibrillation with RVR Patient presented in A fib on RVR per physician's request earlier today; with rate of 147 on arrival. Patient with history of newly diagnosed PAF in December 2021 following near-syncopal episodes; not anticoagulated due to hx of multiple falls. She was started on Diltiazem drip. Cardiology following.   -Cardiology following  -continue Diltiazem infusion  -Resume Home Toprol at reduced $RemoveBe'25mg'xFLNptyAX$  dose  -Not a good anticoagulation candidate    Abdominal Pain Enterocolitis Patient reporting long-standing hx of diffuse abdominal pain, that has worsened in the past month. No leukocytosis. Likely multifactorial with contribution of fluid overload state from acute on chronic HFpEF, and potential pelvic congestion syndrome per pelvic vascular prominence on CT. Evident fibroids may also be contributory. Patient does have ventral hernias with evidence of some bowel involvement, notably without evidence of strangulation. Appropriate on abd exam, though with moderate diffuse pain.   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 BNP significantly elevated at 768.8 on admission, with chest x-ray showing cardiomegaly stable from prior imaging, in addition to a new small right pleural effusion. Patient reporting long-standing non-productive cough, denies orthopnea.  -f/u repeat echo -Lasix 40 BID per Cardiology -Klor-con 10 mEq daily with diuresis   Hypertension Patient significantly hypertensive on arrival; with marked improvement to 140s/90s.  -Resume home losartan and Amlodipine 5 tomorrow -On reduced dose Toprol 25, and dilt drip for AF with RVR -continued monitoring   Hyperdense  right adrenal gland nodule per CT -outpatient adrenal CT or MRI   Borderline dilation of intra and extra hepatic biliary tree and pancreatic duct Patient with hx of cholecystectomy and normal LFTs are reassuring at this time.   -outpatient MRCP if clinically indicated  Dispo: Admit patient to Inpatient with expected length of stay greater than 2 midnights.  SignedAzell Der, Medical Student 12/02/2020, 5:32 PM    27 yof with hypertension, hyperlipidemia and recent diagnosis of PAF (09/2020) who presented to [facility] after being evaluated at an Urgent Care for abdominal discomfort and found to be in Afib with RVR.  Unfortunately, patient is a poor historian and had minimal contribution to the history. She daughter was unable to be reached. She notes a one month history of abdominal pain without nausea or vomitting. She notes minimal pain at the time of my evaluation.   Upon arrival to the ED, she was started on a diltizem gtt for RVR. Abdominal CT showed multiple abnormalities including pleural effusions, ascites, edema and inflammatory changes involving the intestines.   Exam:  General: comfortable appearing, in NAD Cardiac: tachycardic. Irregular rhythm. No LE edema. No JVD. Extremities warm Pulm: breathing comfortably on RA. Lung sounds diminished bilaterally GI: colostomy present without surrounding erythema. Ventral hernia present. +bs. Mild tenderness to palpation in the epigastric region.   A/P  Afib with RVR. Recently diagnosed in December 2021 with PAF. CHADSVASC 4. Not on anticoagulation due to recurrent falls. HASBLED 2--although may be higher pending further history from the daughter HR improved on diltiazem gtt.  CHF exacerbation. Does not appear terribly volume overloaded on exam however CT findings of cardiomegaly, biatrial enlargement,  b/l pleural effusions, gut edema, ascites Small pericardial effusion noted on chest CT. Tamponade physiology not evident  based on hemodynamics at time of exam Hypertension Management per cardiology -lasix 48m IV BID -metoprolol 26m Bilateral pleural effusions. Stable on room air. Likely related to increased volume status.   Abdominal pain. Likely due, at least in part, to gut edema. Low suspicion for enterocolitis at this time--suspect CT findings reflect gut edema. No leukocytosis, fever Hepatic steatosis as noted on imaging Peripancreatic fluid collection. Considered pancreatitis. Lipase normal. Ventral hernia, peristomal hernia. Bowel entrapment considered however would expect patient to be in signfiicantly more pain. Borderline pancreatic and biliary duct dilation as noted on CT. LFTs, lipase normal. Doubtful of correlation to abdominal pain. UTI considered. UA unremarkable Plan: suspect sx will improve with diuresis. No further workup at this time  RyMitzi HansenMD Internal Medicine Resident PGY-2 [facility] Pager: [phone]/05/2021 11:16 PM

## 2020-12-02 NOTE — ED Notes (Signed)
Daughter updated at this time.   

## 2020-12-02 NOTE — ED Notes (Signed)
Sent a urine culture with the urine specimen 

## 2020-12-02 NOTE — Consult Note (Addendum)
Cardiology Consultation:   Patient ID: [place]; [id number]; 06/04/1941   Admit date: 12/02/2020 Date of Consult: 12/02/2020  Primary Care Provider: Pcp, No Primary Cardiologist: Lives in [place]   Patient Profile:   Maria Travis is a 80 y.o. female with a history of hypertension, hyperlipidemia, pelvic abscess with sigmoid resection and formation of colostomy, compression fracture with moderate stenosisand recent dx of PAF seen 09/2020 not on anticoagulation secondary to recurrent falls who is being seen today for the evaluation of PAF and CHF at the request of Dr. Vanita Panda.  History of Present Illness:   Maria Travis is an 80yo F with a hx as stated above who presented to [facility] 12/02/2020 from an outside [facility] center with atrial fibrillation with RVR. She initially sought care due to significant abdominal pain however while there, she was noted to be in RVR. Daughter reports that over the last month she has had issues with increasing left lower quadrant pain however no nausea or vomiting.  Given atrial fibrillation with RVR, she was sent to the ED for further evaluation.  On ED admission, she was placed on IV diltiazem infusion. CT imaging suggestive of enterocolitis, consistent with the patient's diffuse abdominal discomfort.  There was no evidence for abscess, obstruction. BNP was found to be elevated at 768. CXR with interval development of small right pleural effusion. HPI somewhat difficult to obtain as patient is poor historian of events that led her to this hospitalization.  As below, she was recently seen by our service for new onset atrial fibrillation.  She lives in [place] and was visiting family during that time.  Plan was for her to return to [place].  She lives alone.  Patient reports she returned to her home however had a recurrent fall and was hospitalized there for her daughter brought her back to [place].  She appears to be comfortable at this  time. Has mild abdominal discomfort.  She denies chest pain, palpitations, shortness of breath, LE edema orthopnea symptoms.  She denies recent illness with fever, cough or chills.  She does report a mild nonproductive cough.  She was seen by our service 09/2020 for the evaluation of syncope and new atrial fibrillation 10/17/2020. Patient reported she had a near syncopal episodewhile getting ready in her bathroom on the day prior to presentation found to have a HR in the 190's on EMS evaluation that improved to the 80's to 120's after IV fluids. EKG showed new onset atrial fibrillation with RVR. Patient was placed on a Diltiazem infusion with some improvement. HsT found to be mildly elevated at 69 with a repeat at 80 however in the absence of chest pain and no prior cardiac history, this was felt to be in the setting of RVR. Chest x-ray showed cardiomegaly with left base atelectasis. Head CT was ordered to rule out stroke given patient's inability to provide a detailed history and this showed no acute intracranial abnormalities. She eventually converted to NSR. She was not started on [facility] due to hx of falls and concern for bleeding. Given that she was visiting form [place], plan was for her to follow in [place] with cardiology.   Past Medical History:  Diagnosis Date  . High cholesterol   . Hypertension     Past Surgical History:  Procedure Laterality Date  . CHOLECYSTECTOMY       Prior to Admission medications   Medication Sig Start Date End Date Taking? Authorizing Provider  allopurinol (ZYLOPRIM) 100 MG  tablet Take 100 mg by mouth daily.    [provider]  amLODipine (NORVASC) 5 MG tablet Take 5 mg by mouth daily.    [provider]  atorvastatin (LIPITOR) 20 MG tablet Take 20 mg by mouth daily.    [provider]  colchicine 0.6 MG tablet Take 0.6 mg by mouth daily.    [provider]  metoprolol succinate (TOPROL-XL) 50 MG 24 hr tablet Take 1.5 tablets (75 mg  total) by mouth daily. Take with or immediately following a meal. 10/19/20   Sande Rives E, PA-C  pantoprazole (PROTONIX) 40 MG tablet Take 40 mg by mouth daily.    [provider]    Inpatient Medications: Scheduled Meds:  Continuous Infusions: . sodium chloride 125 mL/hr at 12/02/20 1111  . ciprofloxacin    . diltiazem (CARDIZEM) infusion 7.5 mg/hr (12/02/20 1144)  . metronidazole     PRN Meds:   Allergies:   No Known Allergies  Social History:   Social History   Socioeconomic History  . Marital status: Single    Spouse name: Not on file  . Number of children: Not on file  . Years of education: Not on file  . Highest education level: Not on file  Occupational History  . Occupation: retired  Tobacco Use  . Smoking status: Never Smoker  . Smokeless tobacco: Never Used  Substance and Sexual Activity  . Alcohol use: Yes    Comment: weekends  . Drug use: No  . Sexual activity: Not on file  Other Topics Concern  . Not on file  Social History Narrative  . Not on file   Social Determinants of Health   Financial Resource Strain: Not on file  Food Insecurity: Not on file  Transportation Needs: Not on file  Physical Activity: Not on file  Stress: Not on file  Social Connections: Not on file  Intimate Partner Violence: Not on file    Family History:   No family history on file. Family Status:  No family status information on file.   ROS:  Please see the history of present illness.  All other ROS reviewed and negative.     Physical Exam/Data:   Vitals:   12/02/20 1345 12/02/20 1445 12/02/20 1515 12/02/20 1530  BP: (!) 133/111  129/88 (!) 142/92  Pulse: (!) 122 (!) 106 (!) 134 (!) 102  Resp: (!) 27  (!) 30 (!) 21  Temp:      TempSrc:      SpO2: 100% (!) 85% 99% 100%  Weight:      Height:       No intake or output data in the 24 hours ending 12/02/20 1555 Filed Weights   12/02/20 1049  Weight: 55.3 kg   Body mass index is 23.83 kg/m.    General: Elderly, NAD Skin: Warm, dry, intact  Neck: Negative for carotid bruits. No JVD Lungs: Diminished bilaterally. Breathing is unlabored. Cardiovascular: Irregularly irregular. No murmurs Abdomen: Soft, mild tenderness with palpation, non-distended. No obvious abdominal masses. Extremities: No edema.  Radial pulses 2+ bilaterally Neuro: Alert and oriented. No focal deficits. No facial asymmetry. MAE spontaneously. Psych: Responds to questions appropriately with normal affect.    EKG:  The EKG was personally reviewed and demonstrates: 12/02/2020 atrial fibrillation with RVR, HR 148 bpm Telemetry:  Telemetry was personally reviewed and demonstrates: 12/02/2020 atrial fibrillation with HR 90s-100s  Relevant CV Studies:  Echocardiogram 10/18/2020: Impressions: 1. Left ventricular ejection fraction, by estimation, is 55 to 60%.  The  left ventricle has normal function. The left ventricle has no regional  wall motion abnormalities. There is mild concentric left ventricular  hypertrophy. Left ventricular diastolic parameters were normal.  2. Right ventricular systolic function is normal. The right ventricular  size is normal. There is moderately elevated pulmonary artery systolic  pressure. The estimated right ventricular systolic pressure is 67.3 mmHg.  3. The mitral valve is degenerative. Mild mitral valve regurgitation. No  evidence of mitral stenosis.  4. Tricuspid valve regurgitation is moderate to severe.  5. The aortic valve is tricuspid. There is moderate calcification of the  aortic valve. There is moderate thickening of the aortic valve. Aortic  valve regurgitation is moderate. Mild to moderate aortic valve  sclerosis/calcification is present, without  any evidence of aortic stenosis. Aortic valve mean gradient measures 4.0  mmHg.  6. The inferior vena cava is normal in size with greater than 50%  respiratory variability, suggesting right atrial pressure of 3  mmHg.  Laboratory Data:  Chemistry Recent Labs  Lab 12/02/20 1049  NA 143  K 3.8  CL 111  CO2 23  GLUCOSE 115*  BUN 15  CREATININE 1.17*  CALCIUM 9.5  GFRNONAA 47*  ANIONGAP 9    Total Protein  Date Value Ref Range Status  12/02/2020 6.6 6.5 - 8.1 g/dL Final   Albumin  Date Value Ref Range Status  12/02/2020 3.5 3.5 - 5.0 g/dL Final   AST  Date Value Ref Range Status  12/02/2020 21 15 - 41 U/L Final   ALT  Date Value Ref Range Status  12/02/2020 20 0 - 44 U/L Final   Alkaline Phosphatase  Date Value Ref Range Status  12/02/2020 56 38 - 126 U/L Final   Total Bilirubin  Date Value Ref Range Status  12/02/2020 0.8 0.3 - 1.2 mg/dL Final   Hematology Recent Labs  Lab 12/02/20 1049  WBC 7.8  RBC 3.86*  HGB 12.2  HCT 37.8  MCV 97.9  MCH 31.6  MCHC 32.3  RDW 15.4  PLT 204   Cardiac EnzymesNo results for input(s): TROPONINI in the last 168 hours. No results for input(s): TROPIPOC in the last 168 hours.  BNP Recent Labs  Lab 12/02/20 1051  BNP 768.8*    DDimer No results for input(s): DDIMER in the last 168 hours. TSH:  Lab Results  Component Value Date   TSH 1.288 10/18/2020   Lipids: Lab Results  Component Value Date   CHOL 162 10/18/2020   HDL 53 10/18/2020   LDLCALC 79 10/18/2020   TRIG 148 10/18/2020   CHOLHDL 3.1 10/18/2020   HgbA1c:No results found for: HGBA1C  Radiology/Studies:  CT ABDOMEN PELVIS W CONTRAST  Result Date: 12/02/2020 CLINICAL DATA:  Diverticulitis, left lower quadrant pain, ostomy EXAM: CT ABDOMEN AND PELVIS WITH CONTRAST TECHNIQUE: Multidetector CT imaging of the abdomen and pelvis was performed using the standard protocol following bolus administration of intravenous contrast. CONTRAST:  81mL OMNIPAQUE IOHEXOL 300 MG/ML  SOLN COMPARISON:  None. FINDINGS: Lower chest: Bilateral effusions, right greater than left with adjacent passive atelectasis. Underlying airspace disease difficult to fully exclude. Cardiomegaly with  biatrial enlargement. Small pericardial effusion. Few coronary artery calcifications are seen. Hepatobiliary: Diffuse hepatic hypoattenuation. No focal lesion. Smooth surface contour. Periportal edema. Prior cholecystectomy. Slight prominence of the intra and extrahepatic biliary tree, common bile duct measures up to 11 mm. No visible calcified gallstone. Pancreas: Nonspecific borderline dilatation of the pancreatic duct to 7 mm in maximal diameter with distal  tapering. No visible focal lesion. Small volume of peripancreatic fluid appears to be redistributed without a focally edematous appearance of pancreas itself. Spleen: Heterogeneous enhancement of the spleen may be related to the late arterial phase/early portal venous phase of contrast timing no concerning focal lesion. Normal splenic size. Adrenals/Urinary Tract: Enhancing nodule seen in the lateral limb of the right adrenal gland is nonspecific. Measures 8 mm in size (6/62, 3/27). Enhancing nature of this finding certainly increases conspicuity. Left adrenal gland is unremarkable. Kidneys enhance and excrete uniformly. Multiple tiny subcentimeter hypoattenuating foci are present in both kidneys, too small to fully characterize on CT imaging but statistically likely benign. Several larger fluid attenuation cysts present in the lower pole right kidney largest measuring up to 2.4 cm with a slightly smaller 1.9 cm lesion demonstrating some isolated mural calcifications. No obstructive urolithiasis or hydronephrosis. Urinary bladder is unremarkable. No bladder calculi or debris. Stomach/Bowel: Distal esophagus is unremarkable. Thickening at the gastric antrum likely related to normal peristalsis. Fluid seen within the lesser sac. Duodenum with a normal sweep across the midline abdomen. Much of the proximal jejunum is contained within a left upper quadrant peristomal hernia without significant mechanical obstruction at this time. Bowel wall appears fairly  uniformly enhancing though some stranding and fluid is noted within the hernia sac. More distal small bowel protrudes into a large rectus diastasis and superimposed ventral hernia sac as well also without resultant obstruction. There is however some questionable edematous mural thickening of the terminal ileum and colon portions of the transverse and splenic flexure protruding into the same herniations detailed above. Distal colonic remnant contains some hyperdense material within the rectal vault. Few scattered colonic diverticular seen along the Hartmann's pouch remnant. No focal diverticular inflammation. Vascular/Lymphatic: Atherosclerotic calcifications within the abdominal aorta and branch vessels. No aneurysm or ectasia. Marked prominence of the left gonadal vein and parametrial vascularity. Some prominence of the right parametrial vascularity is noted as well though less pronounced. No enlarged abdominopelvic lymph nodes. Reproductive: Prominence of the parametrial vasculature, as above. Anteverted uterus. Several heterogeneously enhancing, partially calcified fibroids are seen within the uterus. No concerning adnexal lesions are evident. Other: Diffuse body wall edema. Effusions and moderate volume ascites. Additional stranding of the omentum and mesentery is nonspecific, possibly edematous though serology of the ascitic fluid should be questioned. Large fat and bowel containing hernias, as described above. Significant abdominal wall laxity otherwise. Musculoskeletal: The osseous structures appear diffusely demineralized which may limit detection of small or nondisplaced fractures. No acute osseous abnormality or suspicious osseous lesion. Multilevel degenerative changes are present in the imaged portions of the spine. Grade 1 anterolisthesis L4 on 5 without spondylolysis. Additional degenerative changes in the hips and pelvis. IMPRESSION: 1. Diffuse edematous mural thickening of the terminal ileum and  colon portions of the transverse and splenic flexure which protrude into the ventral herniations described below. Findings could reflect a nonspecific enterocolitis, either infectious, inflammatory or vascular in etiology. 2. Complex, multilobulated fat and bowel containing ventral hernias including a left upper quadrant peristomal hernia in a ventral midline hernia. Without significant mechanical obstruction at this time. There is some stranding and fluid within the hernia sac though may be distributed from the more diffuse features of anasarca elsewhere. 3. Features of anasarca/volume overload with bilateral effusions, moderate volume ascites, circumferential body wall edema. Cardiomegaly and biatrial enlargement may suggest some underlying CHF. Passive atelectatic changes adjacent the pleural effusions, underlying infectious airspace disease not fully excluded. 4. Diffuse hepatic hypoattenuation, suggestive of  hepatic steatosis. Periportal edema is nonspecific and can be seen with hepatic congestion, volume overload or intrinsic liver disease. 5. Nonspecific borderline dilatation of the intra and extrahepatic biliary tree and pancreatic duct albeit with distal tapering. No visible calcified gallstone nor obstructing lesions. While the bile duct dilatation may be related to post cholecystectomy reservoir effect, pancreatic ductal dilatation is somewhat conspicuous. Findings should prompt assessment of LFTs and consider further evaluation with outpatient MRCP as clinically indicated. 6. Hyperdense, possibly enhancing nodule in the right adrenal gland, indeterminate. Consider further evaluation with outpatient adrenal CT or MR. 7. Fibroid uterus. Prominence of the gonadal veins and parametrial vasculature, left greater than right. Findings are nonspecific, but can be seen with pelvic congestion syndrome in the appropriate clinical setting. 8. Aortic Atherosclerosis (ICD10-I70.0). Electronically Signed   By: Lovena Le M.D.   On: 12/02/2020 15:32   DG Chest Portable 1 View  Result Date: 12/02/2020 CLINICAL DATA:  Atrial fibrillation, dyspnea EXAM: PORTABLE CHEST 1 VIEW COMPARISON:  10/17/2020 FINDINGS: The lungs are symmetrically well expanded. Small right pleural effusion is present with associated minimal right basilar atelectasis. The lungs are otherwise clear. No pneumothorax. Cardiac size is mildly enlarged, unchanged. Pulmonary vascularity is normal. No acute bone abnormality. IMPRESSION: Interval development of small right pleural effusion. Stable cardiomegaly. Electronically Signed   By: Fidela Salisbury MD   On: 12/02/2020 11:19   Assessment and Plan:   1. Paroxysmal Atrial Fibrillation -Pt presented to outside UC center 12/02/20 with abdominal pain found to be in atrial fibrillation with RVR on arrival. She was then sent to the ED for further evaluation. She has been placed on IV diltiazem with moderate rate control in the 80s to 90s>>asymptomatic.   -She was seen by our service 09/2020 during a hospitalization for new onset AF. She ultimately converted to NSR during her hospital course and was discharged on Toprol XL 75.  -She was not anticoagulated due to repeat falls and the increased risk of bleeding.  -Echo at that time showed LVEF of 55-60% with normal wall motion, mild LVH, normal diastolic dysfunction, mild MR, moderate to severe TR, moderate AI, and moderately elevated PASP. -Plan to continue diltiazem infusion -Would restart PTA Toprol however will reduce dose to 25mg  for now -Will add IV Lasix 40mg  BID today and follow response tomorrow  -No AC in the setting of recurrent falls however could consider IV Heparin while inpatient  -CHA2DS2-VASC = 4 (HTN, age x2, female).  2. Abdominal pain: -Pt presented to an UC center for the evaluation of abdominal pain with evidence of enterocolitis on imaging. -Treated with IV abx per EDP with management per primary team    3. Hypertension: -BP  mildly elevated at times but mostly well controlled.  -On PTA Losartan, Toprol-XL, Amlodipine 5mg  daily -Will add Toprol back to regimen along with IV diltiazem and follow response    4. Hyperlipidemia: -Lipid panel 09/2020 with Total Cholesterol 162, Triglycerides 148, HDL 53, LDL 79.  -Continue home Lipitor 20mg  daily   For questions or updates, please contact [place] Please consult [email or web address] for contact info under Cardiology/STEMI.   Lyndel Safe NP-C [facility] Pager: [phone] 12/02/2020 3:55 PM   Patient seen and examined.  Agree with above documentation.  Maria Travis is an 80 year old female with a history of paroxysmal atrial fibrillation, hypertension, pelvic abscess with sigmoid resection in colostomy, recurrent falls who is being seen today for evaluation of atrial fibrillation with RVR and heart failure.  She was  initially diagnosed with atrial fibrillation in December 2021.  She had a near syncopal episode and was found to be in A. fib with RVR.  She was admitted and started on a diltiazem drip and converted to sinus rhythm.  Anticoagulation was not started due to history of frequent falls.  She was visiting [place] from [place], and plan was for her to follow-up with cardiology in [place].  She lives alone in [place] but her family is here.  However, after returning to [place] she had another fall and her daughter brought her back to this area.  She has been having abdominal pain recently and went to urgent care today to be seen.  Found to have A. fib with RVR and was sent to the ED.  In the ED, initial vital signs notable for BP 145/114, pulse 145, SPO2 100% on room air.  Labs notable for creatinine 1.17, BNP 769, hemoglobin 12.2, WBC 7.8.  Chest x-ray with small right pleural effusion.  EKG shows atrial fibrillation, rate 148, Q waves in leads III, aVF, poor R wave progression, nonspecific T wave flattening.  Telemetry shows A. fib with rates 90s to  150s, currently 110s.  On exam, patient is alert and oriented, irregular rhythm, tachycardic, no murmurs, diminished breath sounds,+ JVD.  CT abdomen pelvis concerning for enterocolitis.  Echo from 10/18/2020 showed LVEF 55 to 60%, normal RV function, mild LVH, normal diastolic function, RVSP 57 mmHg, mild MR, moderate to severe TR.  For her atrial fibrillation with RVR, she has been started on a diltiazem drip for rate control.  Would continue for now.  Will check a limited echo to ensure no systolic dysfunction, as she is asymptomatic in A. fib and unclear how long she has been in it.  Given her frequent falls, she is not a good anticoagulation candidate.  For her suspected acute on chronic diastolic heart failure, would diurese with IV Lasix.  Donato Heinz, MD

## 2020-12-02 NOTE — ED Notes (Signed)
Pt transported to CT via stretcher in stable condition.

## 2020-12-02 NOTE — ED Provider Notes (Signed)
[facility] EMERGENCY DEPARTMENT Provider Note   CSN: [id number] Arrival date & time: 12/02/20  1037     History Chief Complaint  Patient presents with  . Tachycardia    Seen at [facility] for abd pain, found to be in Afib RVR    Maria Travis is a 80 y.o. female.  HPI Elderly female with multiple medical problems including colostomy, A. fib presents from her 59 office where she initially presented due to concern for abdominal pain, but there was found to have A. fib with rapid ventricular response. History is obtained by the patient, her daughter who arrives after the initial evaluation, and chart review. Patient lives in [place], but was brought to this area by her daughter 3 days ago.  She has been seen and evaluated in this area, however, and was diagnosed with A. fib 2 months ago.  She notes that she has been taking her medication regularly.  However, over the past month or so the patient has had worsening left lower quadrant abdominal pain.  Pain is sore, severe, radiates diffusely.  No new vomiting, though she is anorexic.  She continues to have stool output into her colostomy bag. Today, with this concern she was taken to her physician's office, but there she was found to have A. fib, rapid ventricular response and sent here for intervention. Patient states that she has been taking her medication as directed, seemingly including Eliquis and Cardizem.  She denies chest pain, dyspnea, notes ongoing intermittent cough.   Past Medical History:  Diagnosis Date  . High cholesterol   . Hypertension     Patient Active Problem List   Diagnosis Date Noted  . Elevated troponin 10/18/2020  . Hypertension 10/18/2020  . Hyperlipidemia 10/18/2020  . AKI (acute kidney injury) ([place]) 10/18/2020  . Paroxysmal atrial fibrillation ([place]) 10/17/2020  . Atrial fibrillation, rapid ([place])   . Hypokalemia   . Syncope     Past Surgical History:  Procedure Laterality Date   . CHOLECYSTECTOMY       OB History   No obstetric history on file.     No family history on file.  Social History   Tobacco Use  . Smoking status: Never Smoker  . Smokeless tobacco: Never Used  Substance Use Topics  . Alcohol use: Yes    Comment: weekends  . Drug use: No    Home Medications Prior to Admission medications   Medication Sig Start Date End Date Taking? Authorizing Provider  allopurinol (ZYLOPRIM) 100 MG tablet Take 100 mg by mouth daily.    [provider]  amLODipine (NORVASC) 5 MG tablet Take 5 mg by mouth daily.    [provider]  atorvastatin (LIPITOR) 20 MG tablet Take 20 mg by mouth daily.    [provider]  colchicine 0.6 MG tablet Take 0.6 mg by mouth daily.    [provider]  metoprolol succinate (TOPROL-XL) 50 MG 24 hr tablet Take 1.5 tablets (75 mg total) by mouth daily. Take with or immediately following a meal. 10/19/20   Sande Rives E, PA-C  pantoprazole (PROTONIX) 40 MG tablet Take 40 mg by mouth daily.    [provider]    Allergies    Patient has no known allergies.  Review of Systems   Review of Systems  Constitutional:       Per HPI, otherwise negative  HENT:       Per HPI, otherwise negative  Respiratory:  Per HPI, otherwise negative  Cardiovascular:       Per HPI, otherwise negative  Gastrointestinal: Positive for abdominal pain. Negative for vomiting.  Endocrine:       Negative aside from HPI  Genitourinary:       Neg aside from HPI   Musculoskeletal:       Per HPI, otherwise negative  Skin: Negative.   Neurological: Positive for weakness. Negative for syncope.    Physical Exam Updated Vital Signs BP (!) 137/104   Pulse (!) 147   Temp (!) 97.4 F (36.3 C) (Oral)   Resp (!) 32   Ht 5' (1.524 m)   Wt 55.3 kg   SpO2 100%   BMI 23.83 kg/m   Physical Exam Vitals and nursing note reviewed.  Constitutional:      General: She is not in acute distress.     Appearance: She is well-developed and well-nourished. She is ill-appearing. She is not toxic-appearing or diaphoretic.  HENT:     Head: Normocephalic and atraumatic.  Eyes:     Extraocular Movements: EOM normal.     Conjunctiva/sclera: Conjunctivae normal.  Cardiovascular:     Rate and Rhythm: Tachycardia present. Rhythm irregular.  Pulmonary:     Effort: Pulmonary effort is normal. No respiratory distress.     Breath sounds: Normal breath sounds. No stridor.  Abdominal:     General: There is no distension.     Tenderness: There is abdominal tenderness. There is guarding.    Musculoskeletal:        General: No edema.  Skin:    General: Skin is warm and dry.  Neurological:     Mental Status: She is alert and oriented to person, place, and time.     Cranial Nerves: No cranial nerve deficit.  Psychiatric:        Mood and Affect: Mood and affect normal.      ED Results / Procedures / Treatments   Labs (all labs ordered are listed, but only abnormal results are displayed) Labs Reviewed  CBC WITH DIFFERENTIAL/PLATELET - Abnormal; Notable for the following components:      Result Value   RBC 3.86 (*)    nRBC 0.3 (*)    All other components within normal limits  COMPREHENSIVE METABOLIC PANEL  LIPASE, BLOOD  URINALYSIS, ROUTINE W REFLEX MICROSCOPIC  BRAIN NATRIURETIC PEPTIDE    EKG None  Radiology DG Chest Portable 1 View  Result Date: 12/02/2020 CLINICAL DATA:  Atrial fibrillation, dyspnea EXAM: PORTABLE CHEST 1 VIEW COMPARISON:  10/17/2020 FINDINGS: The lungs are symmetrically well expanded. Small right pleural effusion is present with associated minimal right basilar atelectasis. The lungs are otherwise clear. No pneumothorax. Cardiac size is mildly enlarged, unchanged. Pulmonary vascularity is normal. No acute bone abnormality. IMPRESSION: Interval development of small right pleural effusion. Stable cardiomegaly. Electronically Signed   By: Fidela Salisbury MD   On:  12/02/2020 11:19    Procedures Procedures   Medications Ordered in ED Medications  0.9 %  sodium chloride infusion ( Intravenous New Bag/Given 12/02/20 1111)  diltiazem (CARDIZEM) 1 mg/mL load via infusion 10 mg (10 mg Intravenous Bolus from Bag 12/02/20 1111)    And  diltiazem (CARDIZEM) 125 mg in dextrose 5% 125 mL (1 mg/mL) infusion (7.5 mg/hr Intravenous Rate/Dose Change 12/02/20 1144)  fentaNYL (SUBLIMAZE) injection 12.5 mcg (12.5 mcg Intravenous Given 12/02/20 1108)  ondansetron (ZOFRAN) injection 4 mg (4 mg Intravenous Given 12/02/20 1108)    ED Course  I have reviewed  the triage vital signs and the nursing notes.  Pertinent labs & imaging results that were available during my care of the patient were reviewed by me and considered in my medical decision making (see chart for details).  After initial evaluation with consideration of A. fib, rapid ventricular response, possibly context of ongoing intra-abdominal infection or other processes, the patient placed on continuous cardiac monitoring, pulse oximetry, Cardizem bolus and drip were started, CT scan, labs ordered. Initial cardiac rhythm 140s, A. fib, abnormal Pulse oximetry 96% room air normal      12:07 PM I was called bedside as the patient had an episode of tachypnea, coughing, became flushed in the face. This resolved soon after my arrival, patient notes that she feels better having had a coughing spell.  She notes that she has these sometimes, as she has substantial phlegm production.  Where the heart rate had been decreasing after initiation of diltiazem infusion, it is now 145.  Update heart rate now 90s/100, A. fib, abnormal  3:57 PM CT suggests enterocolitis, consistent with the patient's diffuse abdominal discomfort.  No evidence for abscess, obstruction. Patient does have hernia, though no incarceration/strangulation. Patient remaining labs available, notable for elevated BNP, and given her history of heart failure, A.  fib, ongoing need for continuous Cardizem infusion have consulted our cardiology colleagues. Given the patient's ongoing A. fib, enterocolitis, pain, patient will require admission, and I discussed this case with our internal medicine colleagues in that regard. Patient is aware of all findings, as is her daughter.  MDM Rules/Calculators/A&P  MDM Number of Diagnoses or Management Options Atrial fibrillation with RVR ([place]): new, needed workup Enterocolitis: new, needed workup   Amount and/or Complexity of Data Reviewed Clinical lab tests: reviewed Tests in the radiology section of CPT: reviewed Tests in the medicine section of CPT: reviewed Decide to obtain previous medical records or to obtain history from someone other than the patient: yes Obtain history from someone other than the patient: yes Review and summarize past medical records: yes Discuss the patient with other providers: yes Independent visualization of images, tracings, or specimens: yes  Risk of Complications, Morbidity, and/or Mortality Presenting problems: high Diagnostic procedures: high Management options: high  Critical Care Total time providing critical care: 30-74 minutes (40)   Final Clinical Impression(s) / ED Diagnoses Final diagnoses:  Atrial fibrillation with RVR ([place])  Enterocolitis     Carmin Muskrat, MD 12/02/20 1600

## 2020-12-02 NOTE — ED Triage Notes (Signed)
Pt from [place], takes Eliquis and Diltizem. Seen for LLQ pain (has ostomy), noted to be in Afib RVR, sent to ED

## 2020-12-03 ENCOUNTER — Inpatient Hospital Stay (HOSPITAL_COMMUNITY): Payer: Medicare Other

## 2020-12-03 ENCOUNTER — Encounter (HOSPITAL_COMMUNITY): Payer: Self-pay | Admitting: Internal Medicine

## 2020-12-03 DIAGNOSIS — I5041 Acute combined systolic (congestive) and diastolic (congestive) heart failure: Secondary | ICD-10-CM

## 2020-12-03 DIAGNOSIS — R55 Syncope and collapse: Secondary | ICD-10-CM

## 2020-12-03 DIAGNOSIS — I5033 Acute on chronic diastolic (congestive) heart failure: Secondary | ICD-10-CM

## 2020-12-03 DIAGNOSIS — K529 Noninfective gastroenteritis and colitis, unspecified: Secondary | ICD-10-CM

## 2020-12-03 DIAGNOSIS — I4891 Unspecified atrial fibrillation: Secondary | ICD-10-CM | POA: Diagnosis not present

## 2020-12-03 LAB — ECHOCARDIOGRAM LIMITED
AV Peak grad: 7.1 mmHg
Ao pk vel: 1.33 m/s
Area-P 1/2: 4.06 cm2
Height: 60 in
P 1/2 time: 568 msec
S' Lateral: 3.2 cm
Weight: 1952 oz

## 2020-12-03 LAB — MAGNESIUM: Magnesium: 1.2 mg/dL — ABNORMAL LOW (ref 1.7–2.4)

## 2020-12-03 LAB — BASIC METABOLIC PANEL
Anion gap: 10 (ref 5–15)
BUN: 9 mg/dL (ref 8–23)
CO2: 22 mmol/L (ref 22–32)
Calcium: 8.6 mg/dL — ABNORMAL LOW (ref 8.9–10.3)
Chloride: 106 mmol/L (ref 98–111)
Creatinine, Ser: 0.96 mg/dL (ref 0.44–1.00)
GFR, Estimated: 59 mL/min — ABNORMAL LOW (ref >60)
Glucose, Bld: 235 mg/dL — ABNORMAL HIGH (ref 70–99)
Potassium: 3.1 mmol/L — ABNORMAL LOW (ref 3.5–5.1)
Sodium: 138 mmol/L (ref 135–145)

## 2020-12-03 LAB — APTT: aPTT: 106 seconds — ABNORMAL HIGH (ref 24–36)

## 2020-12-03 LAB — HEPARIN LEVEL (UNFRACTIONATED): Heparin Unfractionated: 1.03 IU/mL — ABNORMAL HIGH (ref 0.30–0.70)

## 2020-12-03 MED ORDER — SODIUM CHLORIDE 0.9 % WEIGHT BASED INFUSION: 3.0000 mL/kg/h | INTRAVENOUS | Status: DC

## 2020-12-03 MED ORDER — POTASSIUM CHLORIDE 20 MEQ PO PACK
60.0000 meq | PACK | Freq: Once | ORAL | Status: AC
  Administered 2020-12-03: 60 meq via ORAL
  Filled 2020-12-03: qty 3

## 2020-12-03 MED ORDER — SODIUM CHLORIDE 0.9% FLUSH
3.0000 mL | Freq: Two times a day (BID) | INTRAVENOUS | Status: DC
  Administered 2020-12-03: 3 mL via INTRAVENOUS

## 2020-12-03 MED ORDER — SODIUM CHLORIDE 0.9 % IV SOLN: 250.0000 mL | INTRAVENOUS | Status: DC | PRN

## 2020-12-03 MED ORDER — SODIUM CHLORIDE 0.9 % WEIGHT BASED INFUSION
1.0000 mL/kg/h | INTRAVENOUS | Status: DC
  Administered 2020-12-04: 1 mL/kg/h via INTRAVENOUS

## 2020-12-03 MED ORDER — HEPARIN (PORCINE) 25000 UT/250ML-% IV SOLN
650.0000 [IU]/h | INTRAVENOUS | Status: DC
  Administered 2020-12-03: 800 [IU]/h via INTRAVENOUS
  Filled 2020-12-03: qty 250

## 2020-12-03 MED ORDER — GADOBUTROL 1 MMOL/ML IV SOLN
6.0000 mL | Freq: Once | INTRAVENOUS | Status: AC | PRN
  Administered 2020-12-04: 6 mL via INTRAVENOUS

## 2020-12-03 MED ORDER — METOPROLOL TARTRATE 50 MG PO TABS
50.0000 mg | ORAL_TABLET | Freq: Two times a day (BID) | ORAL | Status: DC
  Administered 2020-12-03 – 2020-12-04 (×3): 50 mg via ORAL
  Filled 2020-12-03 (×2): qty 1
  Filled 2020-12-03: qty 2

## 2020-12-03 MED ORDER — SODIUM CHLORIDE 0.9% FLUSH: 3.0000 mL | INTRAVENOUS | Status: DC | PRN

## 2020-12-03 MED ORDER — SODIUM CHLORIDE 0.9% FLUSH
3.0000 mL | Freq: Two times a day (BID) | INTRAVENOUS | Status: DC
  Administered 2020-12-04 – 2020-12-06 (×3): 3 mL via INTRAVENOUS

## 2020-12-03 MED ORDER — MAGNESIUM SULFATE 4 GM/100ML IV SOLN
4.0000 g | Freq: Once | INTRAVENOUS | Status: AC
  Administered 2020-12-03: 4 g via INTRAVENOUS
  Filled 2020-12-03: qty 100

## 2020-12-03 MED ORDER — ASPIRIN 81 MG PO CHEW
81.0000 mg | CHEWABLE_TABLET | ORAL | Status: AC
Start: 2020-12-04 — End: 2020-12-04
  Administered 2020-12-04: 81 mg via ORAL
  Filled 2020-12-03: qty 1

## 2020-12-03 NOTE — ED Notes (Signed)
Lunch Tray Ordered @ 1046. 

## 2020-12-03 NOTE — Progress Notes (Signed)
Subjective: Patient seen earlier in AM. Reports abdominal pain is improving. Denies chest pain and feeling palpitations.   Patient seen later in the afternoon with daughter at bedside. Daughter reports patient transitioned to SNF in [place] following additional hospitalization for A fib with RVR there at which point Eliquis was started. Had been at the SNF for the past three weeks, with reported improvement to strength. No falls there. Came to [place] few days ago. Near fall on Sunday while playing with grandchild, but was caught by son-in-law without impact. Was following regularly with doctor in [place]. Patient manages her colostomy bag. Daughter manages patient's meds; endorsed continued compliance on Eliquis PTA. Reports mom has not been eating much, but eats a lot of junk food. Unclear of usual weight.    Objective:  Vital signs in last 24 hours: Vitals:   12/03/20 1150 12/03/20 1151 12/03/20 1154 12/03/20 1248  BP:   133/88 (!) 144/87  Pulse:    93  Resp:   (!) 24 (!) 22  Temp: 97.6 F (36.4 C)   (!) 96.4 F (35.8 C)  TempSrc: Oral   Axillary  SpO2:  98%  99%  Weight:      Height:       Weight change:   Intake/Output Summary (Last 24 hours) at 12/03/2020 1450 Last data filed at 12/03/2020 1026 Gross per 24 hour  Intake 218.41 ml  Output --  Net 218.41 ml    Physical Exam Constitutional:      General: She is not in acute distress.    Appearance: She is not ill-appearing, toxic-appearing or diaphoretic.     Comments: Pleasant patient seen lying comfortably in bed, NAD. Conversant and engaged. Breathing comfortably on RA.  Cardiovascular:     Rate and Rhythm: Normal rate. Rhythm irregular.  Pulmonary:     Effort: Pulmonary effort is normal. No respiratory distress.  Abdominal:     General: There is distension.     Palpations: Abdomen is soft.     Tenderness: There is abdominal tenderness.     Comments: Diffusely tender, with enhanced tenderness in epigastric area. Soft  ventral hernias. Colostomy bag in place (recently cleared)  Neurological:     Mental Status: She is alert.     Assessment/Plan:  Active Problems:   Atrial fibrillation with RVR ([place])  Maria Travis is an 3 F with Hx of  PAF (diagnosed in 09/2020 following near-syncope episodes; not on anticoagulation due to frequent falls), hypertension, hyperlipidemia, s/p sigmoid colectomy with colostomy 7 years ago admitted for Atrial Fibrillation with RVR, and Heart failure exacerbation after presenting at urgent care for abdominal pain. Abdominal pain is chronic, and has been worsening over the past month. Echo in 2021 showed LVEF of 55-60. 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   Atrial Fibrillation with RVR Patient presented in A fib on RVR per physician's request earlier today; with rate of 147 on arrival. Patient with history of newly diagnosed PAF in December 2021 following near-syncopal episodes; previously not anticoagulated due to hx of multiple falls. Later started on Eliquis in [place], with reported compliance. She was started on Diltiazem drip. Cardiology following.   -Cardiology following  -Stopped Diltiazem infusion  -Started Metoprolol 50 BID  -Heparin per pharmacy for now; resume on home Eliquis later  -Likely TEE and DCCV later this week if does not spontaneously convert   Acute on Chronic HF  SOB Chronic cough Echo  in 2021 showed LVEF of 55-60, degenerative mitral valve with mild regurg, moderate to severe tricuspid regurg, no evidence of aortic stenosis. BNP significantly elevated at 768.8 on admission, with chest x-ray showing cardiomegaly stable from prior imaging, in addition to a new small right pleural effusion. Patient reporting long-standing non-productive cough, denies orthopnea. Echo this Admission showed LVEF of 25-30%, global hypokinesis, Moderate to severe tricuspid  stenosis, moderate tricuspid valve regurg. Likely exacerbated by A fib, and poor diet.   -Cardiac cath scheduled.  -Continue Lasix 40 BID per Cardiology -Klor-con 10 mEq daily with diuresis -Continue Strict Is and Os    Abdominal Pain Enterocolitis Patient reporting long-standing hx of diffuse abdominal pain, that has worsened in the past month. No leukocytosis. Likely multifactorial with contribution of fluid overload state from acute on chronic HF, and potential pelvic congestion syndrome per pelvic vascular prominence on CT. Evident fibroids may also be contributory. Patient does have ventral hernias with evidence of some bowel involvement, notably without evidence of strangulation. Appropriate on abd exam, though with moderate diffuse pain, somewhat greater in epigastric region.   -Monitor for improvement with diuresis -Consult General Surgery if progresses, or concern for ischemia -Consider outpatient Gynecology f/u   Borderline dilation of intra and extra hepatic biliary tree and pancreatic duct Patient with hx of cholecystectomy and normal LFTs are reassuring at this time.   -f/u MRCP   Electrolyte abnormalities -Repleted Magnesium and potassium   Hypertension Patient significantly hypertensive on arrival. Bps have been appropriately controlled.  -Home losartan 25  -continued monitoring   Adrenal Incidentaloma  -outpatient f/u    LOS: 1 day   Azell Der, Medical Student 12/03/2020, 2:50 PM

## 2020-12-03 NOTE — Progress Notes (Signed)
ANTICOAGULATION CONSULT NOTE - Follow Up Consult  Pharmacy Consult for IV Heparin Indication: atrial fibrillation  No Known Allergies  Patient Measurements: Height: 5' (152.4 cm) Weight: 55.3 kg (122 lb) IBW/kg (Calculated) : 45.5 Heparin Dosing Weight: 55.3 kg  Vital Signs: Temp: 98.6 F (37 C) (02/09 2015) Temp Source: Oral (02/09 2015) BP: 136/96 (02/09 2015) Pulse Rate: 100 (02/09 2015)  Labs: Recent Labs    12/02/20 1049 12/02/20 2206 12/03/20 1041 12/03/20 1956  HGB 12.2 10.8*  --   --   HCT 37.8 35.4*  --   --   PLT 204 171  --   --   APTT  --   --   --  106*  HEPARINUNFRC  --   --   --  1.03*  CREATININE 1.17* 0.96 0.96  --     Estimated Creatinine Clearance: 35.8 mL/min (by C-G formula based on SCr of 0.96 mg/dL).   Medical History: Past Medical History:  Diagnosis Date  . Atrial fibrillation ([place])   . Diverticulitis   . High cholesterol   . Hypertension     Assessment: 80 yr old female admitted for atrial fibrillation with RVR. Pt has PMH hx of PAF and is on apixaban 2.5mg  BID (last dose 12/02/20 AM).  Pharmacy was consulted to dose heparin. Given recent apixaban exposure, will monitor anticoagulation using aPTT until aPTT and heparin levels correlate.  aPTT and heparin level ~8 hrs after starting heparin infusion (no bolus) at 800 units/hr were 106 sec (just above the goal range for this pt) and 1.03 units/ml (heparin level high due to continued influence of apixaban on heparin level), respectively. H/H 10.8/35.4, plt 181 (labs from 12/02/20). Per RN, no issues with IV or bleeding observed.  Goal of Therapy:  aPTT: 66-102 sec Heparin level 0.3-0.7 units/ml Monitor platelets by anticoagulation protocol: Yes   Plan:  Decrease heparin infusion to 750 units/hour Check aPTT, heparin level in 8 hrs Monitor daily aPTT, heparin level, CBC Monitor for bleeding F/U post cath on 12/04/20  Gillermina Hu, PharmD, BCPS, [facility] Clinical Pharmacist 12/03/2020 8:30  PM

## 2020-12-03 NOTE — ED Notes (Signed)
Pt assisted to bedside commode. Colostomy bag emptied. Green stool present.

## 2020-12-03 NOTE — Progress Notes (Addendum)
Progress Note  Patient Name: Maria Travis Date of Encounter: 12/03/2020  [facility] Cardiologist: No primary care provider on file.   Subjective   Reports persistent dyspnea.  States she has been having intermittent exertional chest pain  Inpatient Medications    Scheduled Meds: . allopurinol  100 mg Oral Daily  . atorvastatin  20 mg Oral QPM  . colchicine  0.6 mg Oral Daily  . folic acid  1 mg Oral q AM  . furosemide  40 mg Intravenous BID  . losartan  25 mg Oral q AM  . metoprolol succinate  75 mg Oral Daily  . pantoprazole  40 mg Oral QAC breakfast  . potassium chloride  10 mEq Oral q AM  . thiamine  100 mg Oral Daily   Continuous Infusions: . diltiazem (CARDIZEM) infusion 5 mg/hr (12/03/20 0816)   PRN Meds: acetaminophen, ondansetron (ZOFRAN) IV   Vital Signs    Vitals:   12/03/20 0700 12/03/20 0715 12/03/20 0730 12/03/20 0745  BP: (!) 143/83 (!) 140/91 (!) 143/85 137/85  Pulse: 80 81 82 67  Resp: 18 20 20 18   Temp:      TempSrc:      SpO2: 99% 98% 100% 99%  Weight:      Height:       No intake or output data in the 24 hours ending 12/03/20 0929 Last 3 Weights 12/02/2020 10/18/2020 10/17/2020  Weight (lbs) 122 lb 126 lb 1.7 oz 126 lb 1.7 oz  Weight (kg) 55.339 kg 57.2 kg 57.2 kg      Telemetry    AF with rates 80s - Personally Reviewed  ECG    No new ECG - Personally Reviewed  Physical Exam   GEN: No acute distress.   Neck: + JVD Cardiac: tachycardic, irregular, no murmurs Respiratory: Scattered expiratory wheezing GI: Soft, nontender MS: Trace edema Neuro:  Nonfocal  Psych: Normal affect   Labs    High Sensitivity Troponin:  No results for input(s): TROPONINIHS in the last 720 hours.    Chemistry Recent Labs  Lab 12/02/20 1049 12/02/20 2206  NA 143  --   K 3.8  --   CL 111  --   CO2 23  --   GLUCOSE 115*  --   BUN 15  --   CREATININE 1.17* 0.96  CALCIUM 9.5  --   PROT 6.6  --   ALBUMIN 3.5  --   AST 21  --   ALT 20   --   ALKPHOS 56  --   BILITOT 0.8  --   GFRNONAA 47* 59*  ANIONGAP 9  --      Hematology Recent Labs  Lab 12/02/20 1049 12/02/20 2206  WBC 7.8 6.7  RBC 3.86* 3.47*  HGB 12.2 10.8*  HCT 37.8 35.4*  MCV 97.9 102.0*  MCH 31.6 31.1  MCHC 32.3 30.5  RDW 15.4 15.4  PLT 204 171    BNP Recent Labs  Lab 12/02/20 1051  BNP 768.8*     DDimer No results for input(s): DDIMER in the last 168 hours.   Radiology    CT ABDOMEN PELVIS W CONTRAST  Result Date: 12/02/2020 CLINICAL DATA:  Diverticulitis, left lower quadrant pain, ostomy EXAM: CT ABDOMEN AND PELVIS WITH CONTRAST TECHNIQUE: Multidetector CT imaging of the abdomen and pelvis was performed using the standard protocol following bolus administration of intravenous contrast. CONTRAST:  39mL OMNIPAQUE IOHEXOL 300 MG/ML  SOLN COMPARISON:  None. FINDINGS: Lower chest: Bilateral effusions, right  greater than left with adjacent passive atelectasis. Underlying airspace disease difficult to fully exclude. Cardiomegaly with biatrial enlargement. Small pericardial effusion. Few coronary artery calcifications are seen. Hepatobiliary: Diffuse hepatic hypoattenuation. No focal lesion. Smooth surface contour. Periportal edema. Prior cholecystectomy. Slight prominence of the intra and extrahepatic biliary tree, common bile duct measures up to 11 mm. No visible calcified gallstone. Pancreas: Nonspecific borderline dilatation of the pancreatic duct to 7 mm in maximal diameter with distal tapering. No visible focal lesion. Small volume of peripancreatic fluid appears to be redistributed without a focally edematous appearance of pancreas itself. Spleen: Heterogeneous enhancement of the spleen may be related to the late arterial phase/early portal venous phase of contrast timing no concerning focal lesion. Normal splenic size. Adrenals/Urinary Tract: Enhancing nodule seen in the lateral limb of the right adrenal gland is nonspecific. Measures 8 mm in size  (6/62, 3/27). Enhancing nature of this finding certainly increases conspicuity. Left adrenal gland is unremarkable. Kidneys enhance and excrete uniformly. Multiple tiny subcentimeter hypoattenuating foci are present in both kidneys, too small to fully characterize on CT imaging but statistically likely benign. Several larger fluid attenuation cysts present in the lower pole right kidney largest measuring up to 2.4 cm with a slightly smaller 1.9 cm lesion demonstrating some isolated mural calcifications. No obstructive urolithiasis or hydronephrosis. Urinary bladder is unremarkable. No bladder calculi or debris. Stomach/Bowel: Distal esophagus is unremarkable. Thickening at the gastric antrum likely related to normal peristalsis. Fluid seen within the lesser sac. Duodenum with a normal sweep across the midline abdomen. Much of the proximal jejunum is contained within a left upper quadrant peristomal hernia without significant mechanical obstruction at this time. Bowel wall appears fairly uniformly enhancing though some stranding and fluid is noted within the hernia sac. More distal small bowel protrudes into a large rectus diastasis and superimposed ventral hernia sac as well also without resultant obstruction. There is however some questionable edematous mural thickening of the terminal ileum and colon portions of the transverse and splenic flexure protruding into the same herniations detailed above. Distal colonic remnant contains some hyperdense material within the rectal vault. Few scattered colonic diverticular seen along the Hartmann's pouch remnant. No focal diverticular inflammation. Vascular/Lymphatic: Atherosclerotic calcifications within the abdominal aorta and branch vessels. No aneurysm or ectasia. Marked prominence of the left gonadal vein and parametrial vascularity. Some prominence of the right parametrial vascularity is noted as well though less pronounced. No enlarged abdominopelvic lymph nodes.  Reproductive: Prominence of the parametrial vasculature, as above. Anteverted uterus. Several heterogeneously enhancing, partially calcified fibroids are seen within the uterus. No concerning adnexal lesions are evident. Other: Diffuse body wall edema. Effusions and moderate volume ascites. Additional stranding of the omentum and mesentery is nonspecific, possibly edematous though serology of the ascitic fluid should be questioned. Large fat and bowel containing hernias, as described above. Significant abdominal wall laxity otherwise. Musculoskeletal: The osseous structures appear diffusely demineralized which may limit detection of small or nondisplaced fractures. No acute osseous abnormality or suspicious osseous lesion. Multilevel degenerative changes are present in the imaged portions of the spine. Grade 1 anterolisthesis L4 on 5 without spondylolysis. Additional degenerative changes in the hips and pelvis. IMPRESSION: 1. Diffuse edematous mural thickening of the terminal ileum and colon portions of the transverse and splenic flexure which protrude into the ventral herniations described below. Findings could reflect a nonspecific enterocolitis, either infectious, inflammatory or vascular in etiology. 2. Complex, multilobulated fat and bowel containing ventral hernias including a left upper quadrant peristomal hernia  in a ventral midline hernia. Without significant mechanical obstruction at this time. There is some stranding and fluid within the hernia sac though may be distributed from the more diffuse features of anasarca elsewhere. 3. Features of anasarca/volume overload with bilateral effusions, moderate volume ascites, circumferential body wall edema. Cardiomegaly and biatrial enlargement may suggest some underlying CHF. Passive atelectatic changes adjacent the pleural effusions, underlying infectious airspace disease not fully excluded. 4. Diffuse hepatic hypoattenuation, suggestive of hepatic steatosis.  Periportal edema is nonspecific and can be seen with hepatic congestion, volume overload or intrinsic liver disease. 5. Nonspecific borderline dilatation of the intra and extrahepatic biliary tree and pancreatic duct albeit with distal tapering. No visible calcified gallstone nor obstructing lesions. While the bile duct dilatation may be related to post cholecystectomy reservoir effect, pancreatic ductal dilatation is somewhat conspicuous. Findings should prompt assessment of LFTs and consider further evaluation with outpatient MRCP as clinically indicated. 6. Hyperdense, possibly enhancing nodule in the right adrenal gland, indeterminate. Consider further evaluation with outpatient adrenal CT or MR. 7. Fibroid uterus. Prominence of the gonadal veins and parametrial vasculature, left greater than right. Findings are nonspecific, but can be seen with pelvic congestion syndrome in the appropriate clinical setting. 8. Aortic Atherosclerosis (ICD10-I70.0). Electronically Signed   By: Lovena Le M.D.   On: 12/02/2020 15:32   DG Chest Portable 1 View  Result Date: 12/02/2020 CLINICAL DATA:  Atrial fibrillation, dyspnea EXAM: PORTABLE CHEST 1 VIEW COMPARISON:  10/17/2020 FINDINGS: The lungs are symmetrically well expanded. Small right pleural effusion is present with associated minimal right basilar atelectasis. The lungs are otherwise clear. No pneumothorax. Cardiac size is mildly enlarged, unchanged. Pulmonary vascularity is normal. No acute bone abnormality. IMPRESSION: Interval development of small right pleural effusion. Stable cardiomegaly. Electronically Signed   By: Fidela Salisbury MD   On: 12/02/2020 11:19    Cardiac Studies     Patient Profile     80 y.o. female with a history of hypertension, hyperlipidemia,pelvic abscess with sigmoid resectionandformation of colostomy,compression fracture with moderate stenosisand recent dx of PAF seen 09/2020 not on anticoagulation secondary to recurrent  falls who is being seen today for the evaluation of PAF and CHF   Assessment & Plan    Paroxysmal Atrial Fibrillation: p/w atrial fibrillation with RVR on arrival. AF was diagnosed during admission in 09/2020. CHA2DS2-VASC =4 (HTN, age x2, female).  She was not anticoagulated at that time due to fall risk. Echo in December showedLVEF of 55-60% with normal wall motion.  Had another admit in [place] with Afib with RVR in January, and was started on Eliquis 2.5 mg BID at that time.  During both admissions, converted spontaneously to NSR -Echo today personally reviewed, appears moderate LV systolic dysfunction.  Will discontinue diltiazem gtt given LV dysfunction and titrate metoprolol for rate control -Recommend starting IV heparin for anticoagulation given plans for cath as below.  Discussed with daughter, she had been taking Eliquis 2.5 mg BID for 1 month, no issues.  Plan to restart Eliquis following cath -Likely TEE/DCCV later this week if does not convert to sinus rhythm  Acute combined systolic and diastolic heart failure: EF appears moderateley reduced on echo today.  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  There does appear to be some regional wall motion abnormalities though with septal akinesis, and she reports exertional chest pain, so CAD on ddx -Recommend LHC/RHC to rule out CAD.  If unremarkable and remains in Afib, will plan for TEE/DCCV later this week to restore sinus  rhythm -Risks and benefits of cardiac catheterization have been discussed with the patient and her daughter.  These include bleeding, infection, kidney damage, stroke, heart attack, death.  The patient and her daughter understands these risks and is willing to proceed. -Continue IV lasix 40 mg BID.  No I/Os documented and no labs this AM.  Will order labs.  Needs strict I/Os, daily weights -Continue metoprolol -Continue losartan  For questions or updates, please contact [facility] Please consult  [email or web address] for contact info under        Signed, Donato Heinz, MD  12/03/2020, 9:29 AM

## 2020-12-03 NOTE — ED Notes (Addendum)
Pt had a purewick malfunction, new bed linens provided, and ostomy bag emptied at this time. Pt vss on monitor. NADN. Call bell in reach. Side rails up. Pt watching tv.

## 2020-12-03 NOTE — Progress Notes (Signed)
ANTICOAGULATION CONSULT NOTE - Initial Consult  Pharmacy Consult for heparin Indication: atrial fibrillation  No Known Allergies  Patient Measurements: Height: 5' (152.4 cm) Weight: 55.3 kg (122 lb) IBW/kg (Calculated) : 45.5 Heparin Dosing Weight: 55.3  Vital Signs: Temp: 98 F (36.7 C) (02/09 0452) Temp Source: Oral (02/09 0452) BP: 139/88 (02/09 1045) Pulse Rate: 86 (02/09 1045)  Labs: Recent Labs    12/02/20 1049 12/02/20 2206  HGB 12.2 10.8*  HCT 37.8 35.4*  PLT 204 171  CREATININE 1.17* 0.96    Estimated Creatinine Clearance: 35.8 mL/min (by C-G formula based on SCr of 0.96 mg/dL).   Medical History: Past Medical History:  Diagnosis Date  . High cholesterol   . Hypertension     Medications:  Scheduled:  . allopurinol  100 mg Oral Daily  . atorvastatin  20 mg Oral QPM  . colchicine  0.6 mg Oral Daily  . folic acid  1 mg Oral q AM  . furosemide  40 mg Intravenous BID  . losartan  25 mg Oral q AM  . metoprolol tartrate  50 mg Oral BID  . pantoprazole  40 mg Oral QAC breakfast  . potassium chloride  10 mEq Oral q AM  . sodium chloride flush  3 mL Intravenous Q12H  . thiamine  100 mg Oral Daily    Assessment: 80 yo female admitted for atrial fibrillation w/ RVR. PMH hx of PAF on Eliquis 2.5mg  BID (last dose 12/02/20 AM).  HL likely will not correlate due to recent Eliquis dose.    Initiate heparin drip at 800 units/hr (~15 units/kg/hr).  Will not give a heparin bolus due to recent administration of Eliquis on 2/8 AM.  CBC stable.  Will check 8 hour HL/aPTT.    Goal of Therapy:  Heparin level 0.3-0.7 units/ml Monitor platelets by anticoagulation protocol: Yes   Plan:  No heparin bolus due to recent Eliquis administration Start heparin drip at 800 units/hour Check 8 hour HL/aPTT Monitor s/sx bleeding, cardiology plans F/u post-cath plans  Dimple Nanas, PharmD PGY-1 Acute Care Pharmacy Resident Office: [phone] 12/03/2020 11:40 AM

## 2020-12-03 NOTE — Progress Notes (Signed)
  Echocardiogram 2D Echocardiogram has been performed.  Maria Travis 12/03/2020, 10:15 AM

## 2020-12-04 ENCOUNTER — Ambulatory Visit (HOSPITAL_COMMUNITY)
Admission: RE | Admit: 2020-12-04 | Payer: Medicare Other | Source: Home / Self Care | Admitting: Interventional Cardiology

## 2020-12-04 ENCOUNTER — Encounter (HOSPITAL_COMMUNITY)
Admission: EM | Disposition: A | Discharge status: Home Health | Payer: Self-pay | Source: Home / Self Care | Attending: Internal Medicine

## 2020-12-04 DIAGNOSIS — I5043 Acute on chronic combined systolic (congestive) and diastolic (congestive) heart failure: Secondary | ICD-10-CM | POA: Diagnosis not present

## 2020-12-04 DIAGNOSIS — N179 Acute kidney failure, unspecified: Secondary | ICD-10-CM

## 2020-12-04 DIAGNOSIS — I214 Non-ST elevation (NSTEMI) myocardial infarction: Secondary | ICD-10-CM

## 2020-12-04 DIAGNOSIS — I5041 Acute combined systolic (congestive) and diastolic (congestive) heart failure: Secondary | ICD-10-CM | POA: Diagnosis not present

## 2020-12-04 DIAGNOSIS — I4891 Unspecified atrial fibrillation: Secondary | ICD-10-CM | POA: Diagnosis not present

## 2020-12-04 DIAGNOSIS — K529 Noninfective gastroenteritis and colitis, unspecified: Secondary | ICD-10-CM

## 2020-12-04 DIAGNOSIS — I4819 Other persistent atrial fibrillation: Principal | ICD-10-CM

## 2020-12-04 HISTORY — PX: RIGHT/LEFT HEART CATH AND CORONARY ANGIOGRAPHY: CATH118266

## 2020-12-04 LAB — BASIC METABOLIC PANEL
Anion gap: 12 (ref 5–15)
BUN: 11 mg/dL (ref 8–23)
CO2: 21 mmol/L — ABNORMAL LOW (ref 22–32)
Calcium: 9.4 mg/dL (ref 8.9–10.3)
Chloride: 112 mmol/L — ABNORMAL HIGH (ref 98–111)
Creatinine, Ser: 1.15 mg/dL — ABNORMAL HIGH (ref 0.44–1.00)
GFR, Estimated: 48 mL/min — ABNORMAL LOW (ref >60)
Glucose, Bld: 110 mg/dL — ABNORMAL HIGH (ref 70–99)
Potassium: 4 mmol/L (ref 3.5–5.1)
Sodium: 145 mmol/L (ref 135–145)

## 2020-12-04 LAB — APTT: aPTT: 176 seconds (ref 24–36)

## 2020-12-04 LAB — CBC
HCT: 35.5 % — ABNORMAL LOW (ref 36.0–46.0)
Hemoglobin: 11.6 g/dL — ABNORMAL LOW (ref 12.0–15.0)
MCH: 31.4 pg (ref 26.0–34.0)
MCHC: 32.7 g/dL (ref 30.0–36.0)
MCV: 96.2 fL (ref 80.0–100.0)
Platelets: 175 10*3/uL (ref 150–400)
RBC: 3.69 MIL/uL — ABNORMAL LOW (ref 3.87–5.11)
RDW: 15.4 % (ref 11.5–15.5)
WBC: 7.7 10*3/uL (ref 4.0–10.5)
nRBC: 0.3 % — ABNORMAL HIGH (ref 0.0–0.2)

## 2020-12-04 LAB — PROTIME-INR
INR: 1.4 — ABNORMAL HIGH (ref 0.8–1.2)
Prothrombin Time: 16.9 seconds — ABNORMAL HIGH (ref 11.4–15.2)

## 2020-12-04 LAB — HEPARIN LEVEL (UNFRACTIONATED): Heparin Unfractionated: 0.84 IU/mL — ABNORMAL HIGH (ref 0.30–0.70)

## 2020-12-04 LAB — MAGNESIUM: Magnesium: 1.5 mg/dL — ABNORMAL LOW (ref 1.7–2.4)

## 2020-12-04 SURGERY — RIGHT/LEFT HEART CATH AND CORONARY ANGIOGRAPHY
Anesthesia: LOCAL

## 2020-12-04 MED ORDER — MAGNESIUM SULFATE 4 GM/100ML IV SOLN
4.0000 g | Freq: Once | INTRAVENOUS | Status: AC
  Administered 2020-12-04: 4 g via INTRAVENOUS
  Filled 2020-12-04: qty 100

## 2020-12-04 MED ORDER — APIXABAN 2.5 MG PO TABS
2.5000 mg | ORAL_TABLET | Freq: Two times a day (BID) | ORAL | Status: DC
  Administered 2020-12-04 – 2020-12-08 (×8): 2.5 mg via ORAL
  Filled 2020-12-04 (×8): qty 1

## 2020-12-04 MED ORDER — HEPARIN SODIUM (PORCINE) 1000 UNIT/ML IJ SOLN
INTRAMUSCULAR | Status: AC
  Filled 2020-12-04: qty 1

## 2020-12-04 MED ORDER — MIDAZOLAM HCL 2 MG/2ML IJ SOLN
INTRAMUSCULAR | Status: DC | PRN
  Administered 2020-12-04: 1 mg via INTRAVENOUS

## 2020-12-04 MED ORDER — LIDOCAINE HCL (PF) 1 % IJ SOLN
INTRAMUSCULAR | Status: DC | PRN
  Administered 2020-12-04 (×2): 2 mL
  Administered 2020-12-04: 10 mL

## 2020-12-04 MED ORDER — VERAPAMIL HCL 2.5 MG/ML IV SOLN: INTRAVENOUS | Status: DC | PRN

## 2020-12-04 MED ORDER — ASPIRIN 81 MG PO CHEW: 81.0000 mg | CHEWABLE_TABLET | ORAL | Status: DC

## 2020-12-04 MED ORDER — LIDOCAINE HCL (PF) 1 % IJ SOLN
INTRAMUSCULAR | Status: AC
  Filled 2020-12-04: qty 30

## 2020-12-04 MED ORDER — HEPARIN (PORCINE) 25000 UT/250ML-% IV SOLN
650.0000 [IU]/h | INTRAVENOUS | Status: DC
  Filled 2020-12-04: qty 250

## 2020-12-04 MED ORDER — LABETALOL HCL 5 MG/ML IV SOLN: 10.0000 mg | INTRAVENOUS | Status: AC | PRN

## 2020-12-04 MED ORDER — ONDANSETRON HCL 4 MG/2ML IJ SOLN: 4.0000 mg | Freq: Four times a day (QID) | INTRAMUSCULAR | Status: DC | PRN

## 2020-12-04 MED ORDER — SODIUM CHLORIDE 0.9 % WEIGHT BASED INFUSION: 1.0000 mL/kg/h | INTRAVENOUS | Status: DC

## 2020-12-04 MED ORDER — SODIUM CHLORIDE 0.9% FLUSH
3.0000 mL | Freq: Two times a day (BID) | INTRAVENOUS | Status: DC
  Administered 2020-12-04 – 2020-12-07 (×7): 3 mL via INTRAVENOUS

## 2020-12-04 MED ORDER — DILTIAZEM HCL-DEXTROSE 125-5 MG/125ML-% IV SOLN (PREMIX)
5.0000 mg/h | INTRAVENOUS | Status: DC
  Administered 2020-12-04 (×2): 5 mg/h via INTRAVENOUS
  Filled 2020-12-04 (×2): qty 125

## 2020-12-04 MED ORDER — ACETAMINOPHEN 325 MG PO TABS
650.0000 mg | ORAL_TABLET | ORAL | Status: DC | PRN
  Administered 2020-12-06 (×2): 650 mg via ORAL
  Filled 2020-12-04 (×2): qty 2

## 2020-12-04 MED ORDER — HYDRALAZINE HCL 20 MG/ML IJ SOLN
10.0000 mg | INTRAMUSCULAR | Status: AC | PRN
  Administered 2020-12-04: 10 mg via INTRAVENOUS
  Filled 2020-12-04: qty 1

## 2020-12-04 MED ORDER — SODIUM CHLORIDE 0.9 % IV SOLN
INTRAVENOUS | Status: DC
Start: 2020-12-04 — End: 2020-12-04

## 2020-12-04 MED ORDER — HEPARIN (PORCINE) IN NACL 1000-0.9 UT/500ML-% IV SOLN
INTRAVENOUS | Status: DC | PRN
  Administered 2020-12-04: 1000 mL

## 2020-12-04 MED ORDER — HEPARIN (PORCINE) IN NACL 1000-0.9 UT/500ML-% IV SOLN
INTRAVENOUS | Status: AC
  Filled 2020-12-04: qty 1000

## 2020-12-04 MED ORDER — HEPARIN SODIUM (PORCINE) 1000 UNIT/ML IJ SOLN
INTRAMUSCULAR | Status: DC | PRN
  Administered 2020-12-04: 2000 [IU] via INTRAVENOUS

## 2020-12-04 MED ORDER — SODIUM CHLORIDE 0.9% FLUSH
3.0000 mL | INTRAVENOUS | Status: DC | PRN
Start: 2020-12-04 — End: 2020-12-08

## 2020-12-04 MED ORDER — METOPROLOL TARTRATE 25 MG PO TABS
25.0000 mg | ORAL_TABLET | Freq: Once | ORAL | Status: AC
  Administered 2020-12-04: 25 mg via ORAL
  Filled 2020-12-04: qty 1

## 2020-12-04 MED ORDER — METOPROLOL TARTRATE 50 MG PO TABS
75.0000 mg | ORAL_TABLET | Freq: Two times a day (BID) | ORAL | Status: DC
  Administered 2020-12-04 – 2020-12-06 (×4): 75 mg via ORAL
  Filled 2020-12-04 (×4): qty 1

## 2020-12-04 MED ORDER — SODIUM CHLORIDE 0.9 % IV SOLN
250.0000 mL | INTRAVENOUS | Status: DC | PRN
  Administered 2020-12-05: 500 mL via INTRAVENOUS

## 2020-12-04 MED ORDER — SODIUM CHLORIDE 0.9% FLUSH: 3.0000 mL | INTRAVENOUS | Status: DC | PRN

## 2020-12-04 MED ORDER — SODIUM CHLORIDE 0.9 % IV SOLN: 250.0000 mL | INTRAVENOUS | Status: DC | PRN

## 2020-12-04 MED ORDER — SODIUM CHLORIDE 0.9 % WEIGHT BASED INFUSION: 3.0000 mL/kg/h | INTRAVENOUS | Status: DC

## 2020-12-04 MED ORDER — MIDAZOLAM HCL 2 MG/2ML IJ SOLN
INTRAMUSCULAR | Status: AC
  Filled 2020-12-04: qty 2

## 2020-12-04 MED ORDER — VERAPAMIL HCL 2.5 MG/ML IV SOLN
INTRAVENOUS | Status: AC
  Filled 2020-12-04: qty 2

## 2020-12-04 SURGICAL SUPPLY — 16 items
BAG SNAP BAND KOVER 36X36 (MISCELLANEOUS) ×2 IMPLANT
CATH 5FR JL3.5 JR4 ANG PIG MP (CATHETERS) ×2 IMPLANT
CATH BALLN WEDGE 5F 110CM (CATHETERS) ×2 IMPLANT
CATH SWAN GANZ 7F STRAIGHT (CATHETERS) ×2 IMPLANT
COVER DOME SNAP 22 D (MISCELLANEOUS) ×2 IMPLANT
DEVICE RAD COMP TR BAND LRG (VASCULAR PRODUCTS) ×2 IMPLANT
GLIDESHEATH SLEND A-KIT 6F 22G (SHEATH) ×2 IMPLANT
GUIDEWIRE .025 260CM (WIRE) ×2 IMPLANT
GUIDEWIRE INQWIRE 1.5J.035X260 (WIRE) ×1 IMPLANT
INQWIRE 1.5J .035X260CM (WIRE) ×2
KIT HEART LEFT (KITS) ×2 IMPLANT
PACK CARDIAC CATHETERIZATION (CUSTOM PROCEDURE TRAY) ×2 IMPLANT
SHEATH GLIDE SLENDER 4/5FR (SHEATH) ×2 IMPLANT
SHEATH PINNACLE 7F 10CM (SHEATH) ×2 IMPLANT
TRANSDUCER W/STOPCOCK (MISCELLANEOUS) ×2 IMPLANT
TUBING CIL FLEX 10 FLL-RA (TUBING) ×2 IMPLANT

## 2020-12-04 NOTE — H&P (View-Only) (Signed)
Progress Note  Patient Name: Maria Travis Date of Encounter: 12/04/2020  [facility] Cardiologist: No primary care provider on file.   Subjective   Had AF overnight to 140s, was restarted on diltiazem gtt.  Currently 100-110s.  Reports having dyspnea this morning.  Denies any chest pain except when she coughs.  Inpatient Medications    Scheduled Meds: . allopurinol  100 mg Oral Daily  . atorvastatin  20 mg Oral QPM  . colchicine  0.6 mg Oral Daily  . folic acid  1 mg Oral q AM  . furosemide  40 mg Intravenous BID  . losartan  25 mg Oral q AM  . metoprolol tartrate  50 mg Oral BID  . pantoprazole  40 mg Oral QAC breakfast  . potassium chloride  10 mEq Oral q AM  . sodium chloride flush  3 mL Intravenous Q12H  . thiamine  100 mg Oral Daily   Continuous Infusions: . sodium chloride    . sodium chloride 20 mL/hr at 12/04/20 0514  . sodium chloride 1 mL/kg/hr (12/04/20 0513)  . diltiazem (CARDIZEM) infusion 5 mg/hr (12/04/20 0059)  . heparin 750 Units/hr (12/03/20 2132)  . magnesium sulfate bolus IVPB 4 g (12/04/20 0813)   PRN Meds: sodium chloride, acetaminophen, ondansetron (ZOFRAN) IV, sodium chloride flush   Vital Signs    Vitals:   12/04/20 0412 12/04/20 0500 12/04/20 0830 12/04/20 0909  BP: (!) 147/94  (!) 143/109 (!) 145/104  Pulse: (!) 118  (!) 105 100  Resp: 19  (!) 21 19  Temp: 98.1 F (36.7 C)  (!) 97.2 F (36.2 C) (!) 97.3 F (36.3 C)  TempSrc: Oral  Oral Oral  SpO2: 94%  95% 98%  Weight:  55.2 kg    Height:        Intake/Output Summary (Last 24 hours) at 12/04/2020 0915 Last data filed at 12/03/2020 1026 Gross per 24 hour  Intake 218.41 ml  Output --  Net 218.41 ml   Last 3 Weights 12/04/2020 12/02/2020 10/18/2020  Weight (lbs) 121 lb 11.1 oz 122 lb 126 lb 1.7 oz  Weight (kg) 55.2 kg 55.339 kg 57.2 kg      Telemetry    AF with rates 140s overnight, now 100-110s - Personally Reviewed  ECG    No new ECG - Personally  Reviewed  Physical Exam   GEN: No acute distress.   Neck: + JVD Cardiac: tachycardic, irregular, no murmurs Respiratory: Bibasilar crackles GI: Soft, nontender MS: No edema Neuro:  Nonfocal  Psych: Normal affect   Labs    High Sensitivity Troponin:  No results for input(s): TROPONINIHS in the last 720 hours.    Chemistry Recent Labs  Lab 12/02/20 1049 12/02/20 2206 12/03/20 1041 12/04/20 0442  NA 143  --  138 145  K 3.8  --  3.1* 4.0  CL 111  --  106 112*  CO2 23  --  22 21*  GLUCOSE 115*  --  235* 110*  BUN 15  --  9 11  CREATININE 1.17* 0.96 0.96 1.15*  CALCIUM 9.5  --  8.6* 9.4  PROT 6.6  --   --   --   ALBUMIN 3.5  --   --   --   AST 21  --   --   --   ALT 20  --   --   --   ALKPHOS 56  --   --   --   BILITOT 0.8  --   --   --  GFRNONAA 47* 59* 59* 48*  ANIONGAP 9  --  10 12     Hematology Recent Labs  Lab 12/02/20 1049 12/02/20 2206 12/04/20 0442  WBC 7.8 6.7 7.7  RBC 3.86* 3.47* 3.69*  HGB 12.2 10.8* 11.6*  HCT 37.8 35.4* 35.5*  MCV 97.9 102.0* 96.2  MCH 31.6 31.1 31.4  MCHC 32.3 30.5 32.7  RDW 15.4 15.4 15.4  PLT 204 171 175    BNP Recent Labs  Lab 12/02/20 1051  BNP 768.8*     DDimer No results for input(s): DDIMER in the last 168 hours.   Radiology    CT ABDOMEN PELVIS W CONTRAST  Result Date: 12/02/2020 CLINICAL DATA:  Diverticulitis, left lower quadrant pain, ostomy EXAM: CT ABDOMEN AND PELVIS WITH CONTRAST TECHNIQUE: Multidetector CT imaging of the abdomen and pelvis was performed using the standard protocol following bolus administration of intravenous contrast. CONTRAST:  73mL OMNIPAQUE IOHEXOL 300 MG/ML  SOLN COMPARISON:  None. FINDINGS: Lower chest: Bilateral effusions, right greater than left with adjacent passive atelectasis. Underlying airspace disease difficult to fully exclude. Cardiomegaly with biatrial enlargement. Small pericardial effusion. Few coronary artery calcifications are seen. Hepatobiliary: Diffuse hepatic  hypoattenuation. No focal lesion. Smooth surface contour. Periportal edema. Prior cholecystectomy. Slight prominence of the intra and extrahepatic biliary tree, common bile duct measures up to 11 mm. No visible calcified gallstone. Pancreas: Nonspecific borderline dilatation of the pancreatic duct to 7 mm in maximal diameter with distal tapering. No visible focal lesion. Small volume of peripancreatic fluid appears to be redistributed without a focally edematous appearance of pancreas itself. Spleen: Heterogeneous enhancement of the spleen may be related to the late arterial phase/early portal venous phase of contrast timing no concerning focal lesion. Normal splenic size. Adrenals/Urinary Tract: Enhancing nodule seen in the lateral limb of the right adrenal gland is nonspecific. Measures 8 mm in size (6/62, 3/27). Enhancing nature of this finding certainly increases conspicuity. Left adrenal gland is unremarkable. Kidneys enhance and excrete uniformly. Multiple tiny subcentimeter hypoattenuating foci are present in both kidneys, too small to fully characterize on CT imaging but statistically likely benign. Several larger fluid attenuation cysts present in the lower pole right kidney largest measuring up to 2.4 cm with a slightly smaller 1.9 cm lesion demonstrating some isolated mural calcifications. No obstructive urolithiasis or hydronephrosis. Urinary bladder is unremarkable. No bladder calculi or debris. Stomach/Bowel: Distal esophagus is unremarkable. Thickening at the gastric antrum likely related to normal peristalsis. Fluid seen within the lesser sac. Duodenum with a normal sweep across the midline abdomen. Much of the proximal jejunum is contained within a left upper quadrant peristomal hernia without significant mechanical obstruction at this time. Bowel wall appears fairly uniformly enhancing though some stranding and fluid is noted within the hernia sac. More distal small bowel protrudes into a large  rectus diastasis and superimposed ventral hernia sac as well also without resultant obstruction. There is however some questionable edematous mural thickening of the terminal ileum and colon portions of the transverse and splenic flexure protruding into the same herniations detailed above. Distal colonic remnant contains some hyperdense material within the rectal vault. Few scattered colonic diverticular seen along the Hartmann's pouch remnant. No focal diverticular inflammation. Vascular/Lymphatic: Atherosclerotic calcifications within the abdominal aorta and branch vessels. No aneurysm or ectasia. Marked prominence of the left gonadal vein and parametrial vascularity. Some prominence of the right parametrial vascularity is noted as well though less pronounced. No enlarged abdominopelvic lymph nodes. Reproductive: Prominence of the parametrial vasculature, as above.  Anteverted uterus. Several heterogeneously enhancing, partially calcified fibroids are seen within the uterus. No concerning adnexal lesions are evident. Other: Diffuse body wall edema. Effusions and moderate volume ascites. Additional stranding of the omentum and mesentery is nonspecific, possibly edematous though serology of the ascitic fluid should be questioned. Large fat and bowel containing hernias, as described above. Significant abdominal wall laxity otherwise. Musculoskeletal: The osseous structures appear diffusely demineralized which may limit detection of small or nondisplaced fractures. No acute osseous abnormality or suspicious osseous lesion. Multilevel degenerative changes are present in the imaged portions of the spine. Grade 1 anterolisthesis L4 on 5 without spondylolysis. Additional degenerative changes in the hips and pelvis. IMPRESSION: 1. Diffuse edematous mural thickening of the terminal ileum and colon portions of the transverse and splenic flexure which protrude into the ventral herniations described below. Findings could  reflect a nonspecific enterocolitis, either infectious, inflammatory or vascular in etiology. 2. Complex, multilobulated fat and bowel containing ventral hernias including a left upper quadrant peristomal hernia in a ventral midline hernia. Without significant mechanical obstruction at this time. There is some stranding and fluid within the hernia sac though may be distributed from the more diffuse features of anasarca elsewhere. 3. Features of anasarca/volume overload with bilateral effusions, moderate volume ascites, circumferential body wall edema. Cardiomegaly and biatrial enlargement may suggest some underlying CHF. Passive atelectatic changes adjacent the pleural effusions, underlying infectious airspace disease not fully excluded. 4. Diffuse hepatic hypoattenuation, suggestive of hepatic steatosis. Periportal edema is nonspecific and can be seen with hepatic congestion, volume overload or intrinsic liver disease. 5. Nonspecific borderline dilatation of the intra and extrahepatic biliary tree and pancreatic duct albeit with distal tapering. No visible calcified gallstone nor obstructing lesions. While the bile duct dilatation may be related to post cholecystectomy reservoir effect, pancreatic ductal dilatation is somewhat conspicuous. Findings should prompt assessment of LFTs and consider further evaluation with outpatient MRCP as clinically indicated. 6. Hyperdense, possibly enhancing nodule in the right adrenal gland, indeterminate. Consider further evaluation with outpatient adrenal CT or MR. 7. Fibroid uterus. Prominence of the gonadal veins and parametrial vasculature, left greater than right. Findings are nonspecific, but can be seen with pelvic congestion syndrome in the appropriate clinical setting. 8. Aortic Atherosclerosis (ICD10-I70.0). Electronically Signed   By: Lovena Le M.D.   On: 12/02/2020 15:32   DG Chest Portable 1 View  Result Date: 12/02/2020 CLINICAL DATA:  Atrial fibrillation,  dyspnea EXAM: PORTABLE CHEST 1 VIEW COMPARISON:  10/17/2020 FINDINGS: The lungs are symmetrically well expanded. Small right pleural effusion is present with associated minimal right basilar atelectasis. The lungs are otherwise clear. No pneumothorax. Cardiac size is mildly enlarged, unchanged. Pulmonary vascularity is normal. No acute bone abnormality. IMPRESSION: Interval development of small right pleural effusion. Stable cardiomegaly. Electronically Signed   By: Fidela Salisbury MD   On: 12/02/2020 11:19   ECHOCARDIOGRAM LIMITED  Result Date: 12/03/2020    ECHOCARDIOGRAM LIMITED REPORT   Patient Name:   Maria Travis Date of Exam: 12/03/2020 Medical Rec #:  [id number]        Height:       60.0 in Accession #:    [id number]       Weight:       122.0 lb Date of Birth:  October 13, 1941        BSA:          1.513 m Patient Age:    38 years         BP:  137/85 mmHg Patient Gender: F                HR:           67 bpm. Exam Location:  Inpatient Procedure: Limited Echo, Cardiac Doppler and Color Doppler Indications:    CHF  History:        Patient has prior history of Echocardiogram examinations, most                 recent 10/18/2020. CHF, Arrythmias:Atrial Fibrillation,                 Signs/Symptoms:Shortness of Breath and Syncope; Risk                 Factors:Hypertension and Dyslipidemia.  Sonographer:    Dustin Flock Referring Phys: [id number] [place]  1. Left ventricular ejection fraction, by estimation, is 25 to 30%. The left ventricle has severely decreased function. The left ventricle demonstrates global hypokinesis. The left ventricular internal cavity size was moderately dilated. Left ventricular diastolic parameters are indeterminate.  2. Right ventricular systolic function is mildly reduced. The right ventricular size is mildly enlarged. There is normal pulmonary artery systolic pressure.  3. Left atrial size was mildly dilated.  4. Right atrial size was mildly dilated.  5.  The mitral valve is degenerative. Mild mitral valve regurgitation. No evidence of mitral stenosis. Moderate mitral annular calcification.  6. Tricuspid valve regurgitation is moderate. 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  7. The aortic valve is tricuspid. Aortic valve regurgitation is mild. Mild to moderate aortic valve sclerosis/calcification is present, without any evidence of aortic stenosis.  8. The inferior vena cava is dilated in size with <50% respiratory variability, suggesting right atrial pressure of 15 mmHg. FINDINGS  Left Ventricle: Left ventricular ejection fraction, by estimation, is 25 to 30%. The left ventricle has severely decreased function. The left ventricle demonstrates global hypokinesis. The left ventricular internal cavity size was moderately dilated. There is no left ventricular hypertrophy. Left ventricular diastolic parameters are indeterminate. Right Ventricle: The right ventricular size is mildly enlarged. No increase in right ventricular wall thickness. Right ventricular systolic function is mildly reduced. There is normal pulmonary artery systolic pressure. The tricuspid regurgitant velocity  is 2.70 m/s, and with an assumed right atrial pressure of 3 mmHg, the estimated right ventricular systolic pressure is 23.5 mmHg. Left Atrium: Left atrial size was mildly dilated. Right Atrium: Right atrial size was mildly dilated. Pericardium: There is no evidence of pericardial effusion. Mitral Valve: The mitral valve is degenerative in appearance. There is moderate thickening of the mitral valve leaflet(s). There is moderate calcification of the mitral valve leaflet(s). Moderate mitral annular calcification. Mild mitral valve regurgitation. No evidence of mitral valve stenosis. Tricuspid Valve: The tricuspid valve is normal in structure. Tricuspid valve regurgitation is moderate . 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 Aortic Valve: The aortic valve is tricuspid. Aortic valve regurgitation  is mild. Aortic regurgitation PHT measures 568 msec. Mild to moderate aortic valve sclerosis/calcification is present, without any evidence of aortic stenosis. Aortic valve peak gradient measures 7.1 mmHg. Pulmonic Valve: The pulmonic valve was normal in structure. Pulmonic valve regurgitation is not visualized. No evidence of pulmonic stenosis. Aorta: The aortic root is normal in size and structure. Venous: The inferior vena cava is dilated in size with less than 50% respiratory variability, suggesting right atrial pressure of 15 mmHg. IAS/Shunts: No atrial level shunt detected by color flow Doppler. LEFT VENTRICLE PLAX 2D LVIDd:  4.00 cm  Diastology LVIDs:         3.20 cm  LV e' medial:    7.07 cm/s LV PW:         0.90 cm  LV E/e' medial:  13.6 LV IVS:        0.90 cm  LV e' lateral:   8.16 cm/s LVOT diam:     1.80 cm  LV E/e' lateral: 11.8 LVOT Area:     2.54 cm  LEFT ATRIUM         Index LA diam:    3.00 cm 1.98 cm/m  AORTIC VALVE AV Vmax:      133.00 cm/s AV Peak Grad: 7.1 mmHg AI PHT:       568 msec  AORTA Ao Root diam: 2.70 cm MITRAL VALVE               TRICUSPID VALVE MV Area (PHT): 4.06 cm    TR Peak grad:   29.2 mmHg MV Decel Time: 187 msec    TR Vmax:        270.00 cm/s MV E velocity: 96.00 cm/s MV A velocity: 21.00 cm/s  SHUNTS MV E/A ratio:  4.57        Systemic Diam: 1.80 cm Jenkins Rouge MD Electronically signed by Jenkins Rouge MD Signature Date/Time: 12/03/2020/10:53:24 AM    Final     Cardiac Studies     Patient Profile     80 y.o. female with a history of hypertension, hyperlipidemia,pelvic abscess with sigmoid resectionandformation of colostomy,compression fracture with moderate stenosisand recent dx of PAF seen 09/2020 not on anticoagulation secondary to recurrent falls who is being seen today for the evaluation of PAF and CHF   Assessment & Plan    Paroxysmal Atrial Fibrillation: p/w atrial fibrillation with RVR on arrival. AF was diagnosed during admission in 09/2020.  CHA2DS2-VASC =4 (HTN, age x2, female).  She was not anticoagulated at that time due to fall risk. Echo in December showedLVEF of 55-60% with normal wall motion.  Had another admit in [place] with Afib with RVR in January, and was started on Eliquis 2.5 mg BID at that time.  During both admissions, converted spontaneously to NSR.  Now presenting with AF with RVR, and echo shows new systolic dysfunction,  EF 25-30%. -Would avoid diltiazem given systolic dysfunction, will titrate up metoprolol and wean off dilt gtt.  Increase metoprolol to 75 mg BID  -Continue IV heparin for now given plans for cath as below.  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  There does appear to be some regional wall motion abnormalities though with septal akinesis, and she reports exertional chest pain, so CAD on ddx -Recommend LHC/RHC to rule out CAD.  If unremarkable and remains in Afib, will plan for TEE/DCCV later this week to restore sinus rhythm -Risks and benefits of cardiac catheterization have been discussed with the patient and her daughter.  These include bleeding, infection, kidney damage, stroke, heart attack, death.  The patient and her daughter understands these risks and is willing to proceed. -Continue IV lasix 40 mg BID.  No I/Os doumented, weight down 1 lb.  Needs strict I/Os.  Will f/u results of RHC -Continue metoprolol, will consolidate to XL on discharge -Continue losartan, can plan to transition to [facility] prior to  discharge  Abdominal pain/Enterocolitis: management per primary team, MRCP ordered  For questions or updates, please contact [place] Please consult [email or web address] for contact info under        Signed, Donato Heinz, MD  12/04/2020, 9:15 AM

## 2020-12-04 NOTE — Progress Notes (Signed)
Ms. Maria Travis developed recurrent rapid ventricular rates tonight, up to 150 bpm. I restarted diltiazem drip at 5 mg/hour.   Osvaldo Shipper, MD

## 2020-12-04 NOTE — Progress Notes (Signed)
Site area: right groin  Site Prior to Removal:  Level 0  Pressure Applied For 10 MINUTES    Minutes Beginning at 1410  Manual:   Yes.    Patient Status During Pull:  Stable  Post Pull Groin Site:  Level 0  Post Pull Instructions Given:  Yes.    Post Pull Pulses Present:  Yes.    Dressing Applied:  Yes.    Comments:  Bed rest started at   1430 X 4 hr.

## 2020-12-04 NOTE — Plan of Care (Signed)

## 2020-12-04 NOTE — Progress Notes (Addendum)
Progress Note  Patient Name: Maria Travis Date of Encounter: 12/04/2020  [facility] Cardiologist: No primary care provider on file.   Subjective   Had AF overnight to 140s, was restarted on diltiazem gtt.  Currently 100-110s.  Reports having dyspnea this morning.  Denies any chest pain except when she coughs.  Inpatient Medications    Scheduled Meds: . allopurinol  100 mg Oral Daily  . atorvastatin  20 mg Oral QPM  . colchicine  0.6 mg Oral Daily  . folic acid  1 mg Oral q AM  . furosemide  40 mg Intravenous BID  . losartan  25 mg Oral q AM  . metoprolol tartrate  50 mg Oral BID  . pantoprazole  40 mg Oral QAC breakfast  . potassium chloride  10 mEq Oral q AM  . sodium chloride flush  3 mL Intravenous Q12H  . thiamine  100 mg Oral Daily   Continuous Infusions: . sodium chloride    . sodium chloride 20 mL/hr at 12/04/20 0514  . sodium chloride 1 mL/kg/hr (12/04/20 0513)  . diltiazem (CARDIZEM) infusion 5 mg/hr (12/04/20 0059)  . heparin 750 Units/hr (12/03/20 2132)  . magnesium sulfate bolus IVPB 4 g (12/04/20 0813)   PRN Meds: sodium chloride, acetaminophen, ondansetron (ZOFRAN) IV, sodium chloride flush   Vital Signs    Vitals:   12/04/20 0412 12/04/20 0500 12/04/20 0830 12/04/20 0909  BP: (!) 147/94  (!) 143/109 (!) 145/104  Pulse: (!) 118  (!) 105 100  Resp: 19  (!) 21 19  Temp: 98.1 F (36.7 C)  (!) 97.2 F (36.2 C) (!) 97.3 F (36.3 C)  TempSrc: Oral  Oral Oral  SpO2: 94%  95% 98%  Weight:  55.2 kg    Height:        Intake/Output Summary (Last 24 hours) at 12/04/2020 0915 Last data filed at 12/03/2020 1026 Gross per 24 hour  Intake 218.41 ml  Output --  Net 218.41 ml   Last 3 Weights 12/04/2020 12/02/2020 10/18/2020  Weight (lbs) 121 lb 11.1 oz 122 lb 126 lb 1.7 oz  Weight (kg) 55.2 kg 55.339 kg 57.2 kg      Telemetry    AF with rates 140s overnight, now 100-110s - Personally Reviewed  ECG    No new ECG - Personally  Reviewed  Physical Exam   GEN: No acute distress.   Neck: + JVD Cardiac: tachycardic, irregular, no murmurs Respiratory: Bibasilar crackles GI: Soft, nontender MS: No edema Neuro:  Nonfocal  Psych: Normal affect   Labs    High Sensitivity Troponin:  No results for input(s): TROPONINIHS in the last 720 hours.    Chemistry Recent Labs  Lab 12/02/20 1049 12/02/20 2206 12/03/20 1041 12/04/20 0442  NA 143  --  138 145  K 3.8  --  3.1* 4.0  CL 111  --  106 112*  CO2 23  --  22 21*  GLUCOSE 115*  --  235* 110*  BUN 15  --  9 11  CREATININE 1.17* 0.96 0.96 1.15*  CALCIUM 9.5  --  8.6* 9.4  PROT 6.6  --   --   --   ALBUMIN 3.5  --   --   --   AST 21  --   --   --   ALT 20  --   --   --   ALKPHOS 56  --   --   --   BILITOT 0.8  --   --   --  GFRNONAA 47* 59* 59* 48*  ANIONGAP 9  --  10 12     Hematology Recent Labs  Lab 12/02/20 1049 12/02/20 2206 12/04/20 0442  WBC 7.8 6.7 7.7  RBC 3.86* 3.47* 3.69*  HGB 12.2 10.8* 11.6*  HCT 37.8 35.4* 35.5*  MCV 97.9 102.0* 96.2  MCH 31.6 31.1 31.4  MCHC 32.3 30.5 32.7  RDW 15.4 15.4 15.4  PLT 204 171 175    BNP Recent Labs  Lab 12/02/20 1051  BNP 768.8*     DDimer No results for input(s): DDIMER in the last 168 hours.   Radiology    CT ABDOMEN PELVIS W CONTRAST  Result Date: 12/02/2020 CLINICAL DATA:  Diverticulitis, left lower quadrant pain, ostomy EXAM: CT ABDOMEN AND PELVIS WITH CONTRAST TECHNIQUE: Multidetector CT imaging of the abdomen and pelvis was performed using the standard protocol following bolus administration of intravenous contrast. CONTRAST:  72mL OMNIPAQUE IOHEXOL 300 MG/ML  SOLN COMPARISON:  None. FINDINGS: Lower chest: Bilateral effusions, right greater than left with adjacent passive atelectasis. Underlying airspace disease difficult to fully exclude. Cardiomegaly with biatrial enlargement. Small pericardial effusion. Few coronary artery calcifications are seen. Hepatobiliary: Diffuse hepatic  hypoattenuation. No focal lesion. Smooth surface contour. Periportal edema. Prior cholecystectomy. Slight prominence of the intra and extrahepatic biliary tree, common bile duct measures up to 11 mm. No visible calcified gallstone. Pancreas: Nonspecific borderline dilatation of the pancreatic duct to 7 mm in maximal diameter with distal tapering. No visible focal lesion. Small volume of peripancreatic fluid appears to be redistributed without a focally edematous appearance of pancreas itself. Spleen: Heterogeneous enhancement of the spleen may be related to the late arterial phase/early portal venous phase of contrast timing no concerning focal lesion. Normal splenic size. Adrenals/Urinary Tract: Enhancing nodule seen in the lateral limb of the right adrenal gland is nonspecific. Measures 8 mm in size (6/62, 3/27). Enhancing nature of this finding certainly increases conspicuity. Left adrenal gland is unremarkable. Kidneys enhance and excrete uniformly. Multiple tiny subcentimeter hypoattenuating foci are present in both kidneys, too small to fully characterize on CT imaging but statistically likely benign. Several larger fluid attenuation cysts present in the lower pole right kidney largest measuring up to 2.4 cm with a slightly smaller 1.9 cm lesion demonstrating some isolated mural calcifications. No obstructive urolithiasis or hydronephrosis. Urinary bladder is unremarkable. No bladder calculi or debris. Stomach/Bowel: Distal esophagus is unremarkable. Thickening at the gastric antrum likely related to normal peristalsis. Fluid seen within the lesser sac. Duodenum with a normal sweep across the midline abdomen. Much of the proximal jejunum is contained within a left upper quadrant peristomal hernia without significant mechanical obstruction at this time. Bowel wall appears fairly uniformly enhancing though some stranding and fluid is noted within the hernia sac. More distal small bowel protrudes into a large  rectus diastasis and superimposed ventral hernia sac as well also without resultant obstruction. There is however some questionable edematous mural thickening of the terminal ileum and colon portions of the transverse and splenic flexure protruding into the same herniations detailed above. Distal colonic remnant contains some hyperdense material within the rectal vault. Few scattered colonic diverticular seen along the Hartmann's pouch remnant. No focal diverticular inflammation. Vascular/Lymphatic: Atherosclerotic calcifications within the abdominal aorta and branch vessels. No aneurysm or ectasia. Marked prominence of the left gonadal vein and parametrial vascularity. Some prominence of the right parametrial vascularity is noted as well though less pronounced. No enlarged abdominopelvic lymph nodes. Reproductive: Prominence of the parametrial vasculature, as above.  Anteverted uterus. Several heterogeneously enhancing, partially calcified fibroids are seen within the uterus. No concerning adnexal lesions are evident. Other: Diffuse body wall edema. Effusions and moderate volume ascites. Additional stranding of the omentum and mesentery is nonspecific, possibly edematous though serology of the ascitic fluid should be questioned. Large fat and bowel containing hernias, as described above. Significant abdominal wall laxity otherwise. Musculoskeletal: The osseous structures appear diffusely demineralized which may limit detection of small or nondisplaced fractures. No acute osseous abnormality or suspicious osseous lesion. Multilevel degenerative changes are present in the imaged portions of the spine. Grade 1 anterolisthesis L4 on 5 without spondylolysis. Additional degenerative changes in the hips and pelvis. IMPRESSION: 1. Diffuse edematous mural thickening of the terminal ileum and colon portions of the transverse and splenic flexure which protrude into the ventral herniations described below. Findings could  reflect a nonspecific enterocolitis, either infectious, inflammatory or vascular in etiology. 2. Complex, multilobulated fat and bowel containing ventral hernias including a left upper quadrant peristomal hernia in a ventral midline hernia. Without significant mechanical obstruction at this time. There is some stranding and fluid within the hernia sac though may be distributed from the more diffuse features of anasarca elsewhere. 3. Features of anasarca/volume overload with bilateral effusions, moderate volume ascites, circumferential body wall edema. Cardiomegaly and biatrial enlargement may suggest some underlying CHF. Passive atelectatic changes adjacent the pleural effusions, underlying infectious airspace disease not fully excluded. 4. Diffuse hepatic hypoattenuation, suggestive of hepatic steatosis. Periportal edema is nonspecific and can be seen with hepatic congestion, volume overload or intrinsic liver disease. 5. Nonspecific borderline dilatation of the intra and extrahepatic biliary tree and pancreatic duct albeit with distal tapering. No visible calcified gallstone nor obstructing lesions. While the bile duct dilatation may be related to post cholecystectomy reservoir effect, pancreatic ductal dilatation is somewhat conspicuous. Findings should prompt assessment of LFTs and consider further evaluation with outpatient MRCP as clinically indicated. 6. Hyperdense, possibly enhancing nodule in the right adrenal gland, indeterminate. Consider further evaluation with outpatient adrenal CT or MR. 7. Fibroid uterus. Prominence of the gonadal veins and parametrial vasculature, left greater than right. Findings are nonspecific, but can be seen with pelvic congestion syndrome in the appropriate clinical setting. 8. Aortic Atherosclerosis (ICD10-I70.0). Electronically Signed   By: Lovena Le M.D.   On: 12/02/2020 15:32   DG Chest Portable 1 View  Result Date: 12/02/2020 CLINICAL DATA:  Atrial fibrillation,  dyspnea EXAM: PORTABLE CHEST 1 VIEW COMPARISON:  10/17/2020 FINDINGS: The lungs are symmetrically well expanded. Small right pleural effusion is present with associated minimal right basilar atelectasis. The lungs are otherwise clear. No pneumothorax. Cardiac size is mildly enlarged, unchanged. Pulmonary vascularity is normal. No acute bone abnormality. IMPRESSION: Interval development of small right pleural effusion. Stable cardiomegaly. Electronically Signed   By: Fidela Salisbury MD   On: 12/02/2020 11:19   ECHOCARDIOGRAM LIMITED  Result Date: 12/03/2020    ECHOCARDIOGRAM LIMITED REPORT   Patient Name:   SURABHI GADEA Date of Exam: 12/03/2020 Medical Rec #:  [id number]        Height:       60.0 in Accession #:    [id number]       Weight:       122.0 lb Date of Birth:  1941-04-05        BSA:          1.513 m Patient Age:    20 years         BP:  137/85 mmHg Patient Gender: F                HR:           67 bpm. Exam Location:  Inpatient Procedure: Limited Echo, Cardiac Doppler and Color Doppler Indications:    CHF  History:        Patient has prior history of Echocardiogram examinations, most                 recent 10/18/2020. CHF, Arrythmias:Atrial Fibrillation,                 Signs/Symptoms:Shortness of Breath and Syncope; Risk                 Factors:Hypertension and Dyslipidemia.  Sonographer:    Dustin Flock Referring Phys: [id number] [place]  1. Left ventricular ejection fraction, by estimation, is 25 to 30%. The left ventricle has severely decreased function. The left ventricle demonstrates global hypokinesis. The left ventricular internal cavity size was moderately dilated. Left ventricular diastolic parameters are indeterminate.  2. Right ventricular systolic function is mildly reduced. The right ventricular size is mildly enlarged. There is normal pulmonary artery systolic pressure.  3. Left atrial size was mildly dilated.  4. Right atrial size was mildly dilated.  5.  The mitral valve is degenerative. Mild mitral valve regurgitation. No evidence of mitral stenosis. Moderate mitral annular calcification.  6. Tricuspid valve regurgitation is moderate. 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  7. The aortic valve is tricuspid. Aortic valve regurgitation is mild. Mild to moderate aortic valve sclerosis/calcification is present, without any evidence of aortic stenosis.  8. The inferior vena cava is dilated in size with <50% respiratory variability, suggesting right atrial pressure of 15 mmHg. FINDINGS  Left Ventricle: Left ventricular ejection fraction, by estimation, is 25 to 30%. The left ventricle has severely decreased function. The left ventricle demonstrates global hypokinesis. The left ventricular internal cavity size was moderately dilated. There is no left ventricular hypertrophy. Left ventricular diastolic parameters are indeterminate. Right Ventricle: The right ventricular size is mildly enlarged. No increase in right ventricular wall thickness. Right ventricular systolic function is mildly reduced. There is normal pulmonary artery systolic pressure. The tricuspid regurgitant velocity  is 2.70 m/s, and with an assumed right atrial pressure of 3 mmHg, the estimated right ventricular systolic pressure is 77.4 mmHg. Left Atrium: Left atrial size was mildly dilated. Right Atrium: Right atrial size was mildly dilated. Pericardium: There is no evidence of pericardial effusion. Mitral Valve: The mitral valve is degenerative in appearance. There is moderate thickening of the mitral valve leaflet(s). There is moderate calcification of the mitral valve leaflet(s). Moderate mitral annular calcification. Mild mitral valve regurgitation. No evidence of mitral valve stenosis. Tricuspid Valve: The tricuspid valve is normal in structure. Tricuspid valve regurgitation is moderate . 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 Aortic Valve: The aortic valve is tricuspid. Aortic valve regurgitation  is mild. Aortic regurgitation PHT measures 568 msec. Mild to moderate aortic valve sclerosis/calcification is present, without any evidence of aortic stenosis. Aortic valve peak gradient measures 7.1 mmHg. Pulmonic Valve: The pulmonic valve was normal in structure. Pulmonic valve regurgitation is not visualized. No evidence of pulmonic stenosis. Aorta: The aortic root is normal in size and structure. Venous: The inferior vena cava is dilated in size with less than 50% respiratory variability, suggesting right atrial pressure of 15 mmHg. IAS/Shunts: No atrial level shunt detected by color flow Doppler. LEFT VENTRICLE PLAX 2D LVIDd:  4.00 cm  Diastology LVIDs:         3.20 cm  LV e' medial:    7.07 cm/s LV PW:         0.90 cm  LV E/e' medial:  13.6 LV IVS:        0.90 cm  LV e' lateral:   8.16 cm/s LVOT diam:     1.80 cm  LV E/e' lateral: 11.8 LVOT Area:     2.54 cm  LEFT ATRIUM         Index LA diam:    3.00 cm 1.98 cm/m  AORTIC VALVE AV Vmax:      133.00 cm/s AV Peak Grad: 7.1 mmHg AI PHT:       568 msec  AORTA Ao Root diam: 2.70 cm MITRAL VALVE               TRICUSPID VALVE MV Area (PHT): 4.06 cm    TR Peak grad:   29.2 mmHg MV Decel Time: 187 msec    TR Vmax:        270.00 cm/s MV E velocity: 96.00 cm/s MV A velocity: 21.00 cm/s  SHUNTS MV E/A ratio:  4.57        Systemic Diam: 1.80 cm Jenkins Rouge MD Electronically signed by Jenkins Rouge MD Signature Date/Time: 12/03/2020/10:53:24 AM    Final     Cardiac Studies     Patient Profile     80 y.o. female with a history of hypertension, hyperlipidemia,pelvic abscess with sigmoid resectionandformation of colostomy,compression fracture with moderate stenosisand recent dx of PAF seen 09/2020 not on anticoagulation secondary to recurrent falls who is being seen today for the evaluation of PAF and CHF   Assessment & Plan    Paroxysmal Atrial Fibrillation: p/w atrial fibrillation with RVR on arrival. AF was diagnosed during admission in 09/2020.  CHA2DS2-VASC =4 (HTN, age x2, female).  She was not anticoagulated at that time due to fall risk. Echo in December showedLVEF of 55-60% with normal wall motion.  Had another admit in [place] with Afib with RVR in January, and was started on Eliquis 2.5 mg BID at that time.  During both admissions, converted spontaneously to NSR.  Now presenting with AF with RVR, and echo shows new systolic dysfunction,  EF 25-30%. -Would avoid diltiazem given systolic dysfunction, will titrate up metoprolol and wean off dilt gtt.  Increase metoprolol to 75 mg BID  -Continue IV heparin for now given plans for cath as below.  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  There does appear to be some regional wall motion abnormalities though with septal akinesis, and she reports exertional chest pain, so CAD on ddx -Recommend LHC/RHC to rule out CAD.  If unremarkable and remains in Afib, will plan for TEE/DCCV later this week to restore sinus rhythm -Risks and benefits of cardiac catheterization have been discussed with the patient and her daughter.  These include bleeding, infection, kidney damage, stroke, heart attack, death.  The patient and her daughter understands these risks and is willing to proceed. -Continue IV lasix 40 mg BID.  No I/Os doumented, weight down 1 lb.  Needs strict I/Os.  Will f/u results of RHC -Continue metoprolol, will consolidate to XL on discharge -Continue losartan, can plan to transition to [facility] prior to  discharge  Abdominal pain/Enterocolitis: management per primary team, MRCP ordered  For questions or updates, please contact [place] Please consult [email or web address] for contact info under        Signed, Donato Heinz, MD  12/04/2020, 9:15 AM

## 2020-12-04 NOTE — Progress Notes (Signed)
Subjective:  Intermittent events: Patient went into RVR; Diltiazem restarted per Cardiology, at 5 mg/hr .    Patient reports doing well. Denies chest pain. Reports some SOB, with reported improvement with elevation of bed at that time. Reports abdominal pain improving but persists. Some tenderness at IV site, denies pruritis.    Objective:  Vital signs in last 24 hours: Vitals:   12/04/20 0830 12/04/20 0909 12/04/20 1000 12/04/20 1256  BP: (!) 143/109 (!) 145/104 (!) 140/97 (!) 141/85  Pulse: (!) 105 100 (!) 102 86  Resp: (!) 21 19    Temp: (!) 97.2 F (36.2 C) (!) 97.3 F (36.3 C)    TempSrc: Oral Oral    SpO2: 95% 98%    Weight:      Height:       Weight change: -0.139 kg No intake or output data in the 24 hours ending 12/04/20 1341   Physical Exam Constitutional:      General: She is not in acute distress.    Appearance: She is not ill-appearing, toxic-appearing or diaphoretic.  Pulmonary:     Effort: Pulmonary effort is normal. No respiratory distress.  Abdominal:     General: There is distension.     Palpations: Abdomen is soft.     Tenderness: There is abdominal tenderness (mild diffuse tenderness. Colostomy bag in place. Recently cleared ).  Neurological:     Mental Status: She is alert.      Assessment/Plan:  Principal Problem:   Atrial fibrillation with RVR (HCC) Active Problems:   Syncope   Elevated troponin   Maria Travis is an 79 F with Hx of PAF (diagnosed in 09/2020 following near-syncope episodes; not on anticoagulation due to frequent falls), hypertension, hyperlipidemia, s/p sigmoid colectomy with colostomy 7 years agoadmitted for Atrial Fibrillation with RVR, and Heart failure exacerbation after presenting at urgent care for abdominal pain. Abdominal pain is chronic, and has been worsening over the past month. Echo in 2021 showedLVEF of 55-60. Echo this Admission showed LVEF of 25-30%, global hypokinesis, Moderate to severe tricuspid  stenosis, moderate tricuspid valve regurg.   Atrial Fibrillation with RVR Patient presented in A fib on RVR per physician's request earlier today; with rate of 147 on arrival. Patient with history of newly diagnosed PAF in December 2021 following near-syncopal episodes; previously not anticoagulated due to hx of multiple falls. Later started on Eliquis in [place], with reported compliance. She was started on Diltiazem drip. Cardiology following. Resumed with RVR overnight.    -Cardiology following  -Wean Diltiazem (given systolic dysfunction) as able, uptitrate Metoprolol -Diltiazem resumed at 5 mg/hr overnight   -Metoprolol increased to 75 BID -Transition to home Eliquis following Cath  -Likely TEE and DCCV later this week if does not spontaneously convert    -scheduled for tomorrow 11:00 AM   Acute on Chronic HF SOB Chronic cough Echo in 2021 showedLVEF of 55-60, degenerative mitral valve with mild regurg, moderate to severe tricuspid regurg, no evidence of aortic stenosis. BNP significantly elevated at 768.8 on admission, with chest x-ray showing cardiomegaly stable from prior imaging, in addition to a new small right pleural effusion. Patient reporting long-standing non-productive cough, denies orthopnea. 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 Likely exacerbated by A fib, and poor diet.   -Cardiac cath today, f/u results -Plan to start Entresto prior to discharge -Continue IV Lasix 40 BID per Cardiology -Klor-con 10 mEq dailywith diuresis -Strict Is and  Os   Abdominal Pain Enterocolitis Patient reporting long-standing hx of diffuse abdominal pain, that has worsened in the past month.No leukocytosis.Likely multifactorial with contribution of fluid overload state from acute on chronicHF, andpotential pelvic congestion syndrome per pelvic vascular prominence on CT.Evident  fibroids may also be contributory. Patient does have ventral hernias with evidence ofsome bowel involvement,notablywithoutevidence ofstrangulation.Appropriate on abd exam, though with moderate diffuse pain. Improving with diuresis.    -Monitor for further improvement with diuresis -Consult General Surgery if progresses, or concern for ischemia -Consider outpatient Gynecology f/u   Borderline dilation of intra and extra hepatic biliary tree and pancreatic duct Patient with hx of cholecystectomy and normal LFTs this admission are reassuring.MRCP limited by patient motioning, but unremarkable for obstructing lesions or stones.    Electrolyte abnormalities -Repleted Magnesium    Hypertension -Home losartan 25   Adrenal Incidentaloma  -outpatient f/u      LOS: 2 days   Azell Der, Medical Student 12/04/2020, 1:02 PM

## 2020-12-04 NOTE — Progress Notes (Signed)
Report and care given to [facility]. 20F sheath in Rt Femoral Vein. Rt Femoral soft with no bleeding or hematoma noted. Rt Radial site WNL. Rt Brachial site soft with no hematoma or bleeding noted. Patient states no pain.

## 2020-12-04 NOTE — Progress Notes (Signed)
ANTICOAGULATION CONSULT NOTE - Follow Up Consult  Pharmacy Consult for IV Heparin Indication: atrial fibrillation  No Known Allergies  Patient Measurements: Height: 5' (152.4 cm) Weight: 55.2 kg (121 lb 11.1 oz) IBW/kg (Calculated) : 45.5 Heparin Dosing Weight: 55.3 kg  Vital Signs: Temp: 98.1 F (36.7 C) (02/10 0412) Temp Source: Oral (02/10 0412) BP: 147/94 (02/10 0412) Pulse Rate: 118 (02/10 0412)  Labs: Recent Labs    12/02/20 1049 12/02/20 2206 12/03/20 1041 12/03/20 1956 12/04/20 0442  HGB 12.2 10.8*  --   --  11.6*  HCT 37.8 35.4*  --   --  35.5*  PLT 204 171  --   --  175  APTT  --   --   --  106* 176*  LABPROT  --   --   --   --  16.9*  INR  --   --   --   --  1.4*  HEPARINUNFRC  --   --   --  1.03* 0.84*  CREATININE 1.17* 0.96 0.96  --  1.15*    Estimated Creatinine Clearance: 29.9 mL/min (A) (by C-G formula based on SCr of 1.15 mg/dL (H)).   Medical History: Past Medical History:  Diagnosis Date  . Atrial fibrillation ([place])   . Diverticulitis   . High cholesterol   . Hypertension     Assessment: 80 yr old female admitted for atrial fibrillation with RVR. Pt has PMH hx of PAF and is on apixaban 2.5mg  BID (last dose 12/02/20 AM).  Pharmacy was consulted to dose heparin. Given recent apixaban exposure, will monitor anticoagulation using aPTT until aPTT and heparin levels correlate.  aPTT and heparin level ~8 hrs after starting heparin infusion (no bolus) at 800 units/hr were 106 sec (just above the goal range for this pt) and 1.03 units/ml (heparin level high due to continued influence of apixaban on heparin level), respectively. H/H 10.8/35.4, plt 181 (labs from 12/02/20). Per RN, no issues with IV or bleeding observed.  2/10 AM update:  Heparin level elevated No need for further aPTTs CBC stable  Goal of Therapy:  Heparin level 0.3-0.7 units/ml Monitor platelets by anticoagulation protocol: Yes   Plan:  Decrease heparin infusion to 650  units/hour Check heparin level in 8 hrs  Narda Bonds, PharmD, BCPS Clinical Pharmacist Phone: [phone]

## 2020-12-04 NOTE — Interval H&P Note (Signed)
Cath Lab Visit (complete for each Cath Lab visit)  Clinical Evaluation Leading to the Procedure:   ACS: No.  Non-ACS:    Anginal Classification: CCS Travis  Anti-ischemic medical therapy: Minimal Therapy (1 class of medications)  Non-Invasive Test Results: No non-invasive testing performed  Prior CABG: No previous CABG      History and Physical Interval Note:  12/04/2020 10:55 AM  [facility]  has presented today for surgery, with the diagnosis of nstemi.  The various methods of treatment have been discussed with the patient and family. After consideration of risks, benefits and other options for treatment, the patient has consented to  Procedure(s): RIGHT/LEFT HEART CATH AND CORONARY ANGIOGRAPHY (N/A) as a surgical intervention.  The patient's history has been reviewed, patient examined, no change in status, stable for surgery.  I have reviewed the patient's chart and labs.  Questions were answered to the patient's satisfaction.     Maria Travis

## 2020-12-04 NOTE — Progress Notes (Signed)
PT Cancellation Note  Patient Details Name: Maria Travis MRN: [id number] DOB: August 14, 1941   Cancelled Treatment:    Reason Eval/Treat Not Completed: Patient at procedure or test/unavailable. Pt in cardiac cath. Will follow up tomorrow.   Shary Decamp [place] 12/04/2020, 1:27 PM [place] Pager [phone] Office [phone]

## 2020-12-04 NOTE — Progress Notes (Signed)
Lab called RN with critical lab value. I notified on call MD of this value. Will continue to monitor.

## 2020-12-04 NOTE — Progress Notes (Addendum)
ANTICOAGULATION CONSULT NOTE - Initial Consult  Pharmacy Consult for Heparin Indication: chest pain/ACS  No Known Allergies  Patient Measurements: Height: 5' (152.4 cm) Weight: 55.2 kg (121 lb 11.1 oz) IBW/kg (Calculated) : 45.5 Heparin Dosing Weight: 55.3 kg   Vital Signs: Temp: 97.3 F (36.3 C) (02/10 0909) Temp Source: Oral (02/10 0909) BP: 141/85 (02/10 1256) Pulse Rate: 86 (02/10 1256)  Labs: Recent Labs    12/02/20 1049 12/02/20 2206 12/03/20 1041 12/03/20 1956 12/04/20 0442  HGB 12.2 10.8*  --   --  11.6*  HCT 37.8 35.4*  --   --  35.5*  PLT 204 171  --   --  175  APTT  --   --   --  106* 176*  LABPROT  --   --   --   --  16.9*  INR  --   --   --   --  1.4*  HEPARINUNFRC  --   --   --  1.03* 0.84*  CREATININE 1.17* 0.96 0.96  --  1.15*    Estimated Creatinine Clearance: 29.9 mL/min (A) (by C-G formula based on SCr of 1.15 mg/dL (H)).   Medical History: Past Medical History:  Diagnosis Date  . Atrial fibrillation ([place])   . Diverticulitis   . High cholesterol   . Hypertension     Medications:  Medications Prior to Admission  Medication Sig Dispense Refill Last Dose  . allopurinol (ZYLOPRIM) 100 MG tablet Take 100 mg by mouth daily.   12/02/2020 at am  . apixaban (ELIQUIS) 2.5 MG TABS tablet Take 2.5 mg by mouth 2 (two) times daily.   12/02/2020 at 0800  . atorvastatin (LIPITOR) 20 MG tablet Take 20 mg by mouth every evening.   12/01/2020 at pm  . colchicine 0.6 MG tablet Take 0.6 mg by mouth daily.   12/02/2020 at am  . diltiazem (CARDIZEM CD) 240 MG 24 hr capsule Take 240 mg by mouth in the morning.   12/31/1827 at am  . folic acid (FOLVITE) 1 MG tablet Take 1 mg by mouth in the morning.   12/02/2020 at am  . furosemide (LASIX) 20 MG tablet Take 20 mg by mouth in the morning.   12/02/2020 at am  . losartan (COZAAR) 25 MG tablet Take 25 mg by mouth in the morning.   12/02/2020 at am  . metoprolol succinate (TOPROL-XL) 50 MG 24 hr tablet Take 1.5 tablets (75 mg total)  by mouth daily. Take with or immediately following a meal. (Patient taking differently: Take 75 mg by mouth at bedtime. Take with or immediately following a meal.) 135 tablet 0 12/01/2020 at 2000  . pantoprazole (PROTONIX) 40 MG tablet Take 40 mg by mouth daily before breakfast.   12/02/2020 at am  . potassium chloride (KLOR-CON) 10 MEQ tablet Take 10 mEq by mouth in the morning.   12/02/2020 at am  . thiamine (VITAMIN B-1) 100 MG tablet Take 100 mg by mouth daily.   12/02/2020 at am    Assessment: 80 yo female admitted 12/02/2020 with Afib with RVR. Patient has a PMH of PAF and is on apixaban 2.5mg  BID - last dose 12/02/2020 AM. Patient found to have EF 25-30%. Cath today which was complicated by unsuccessful attempt using R brachial approach and R femoral approach difficulty to advance into pulmonary artery and pt became confused and agitated.   Pharmacy consulted to dose heparin and resume 8 hrs after sheath removal in [place] today. Sheath removed at ~1230.  Patient previously supratherapeutic on heparin 750 units/hr. APTT and heparin level correlating.   Goal of Therapy:  Heparin level 0.3-0.7 units/ml aPTT 66-102 seconds Monitor platelets by anticoagulation protocol: Yes   Plan:  Resume heparin at 650 units/hr at 2030  Check heparin level at 0430 on 2/11 Monitor heparin level, CBC, and S/S of bleeding daily  F/u transition back to apixaban    Cristela Felt, PharmD Clinical Pharmacist  12/04/2020,2:40 PM   Addendum: Pharmacy consulted to resume apixaban 8 hrs after sheath removal. Patient was on apixaban 2.5mg  bid prior to admission. Patient meets dose reduction criteria - age > 1 yrs, wt < 60kg.   Plan: - Resume apixaban 2.5mg  at 2030 tonight

## 2020-12-04 NOTE — Consult Note (Signed)
[name] ostomy follow up Patient receiving care in [place]. Stoma type/location: LUQ colostomy Stomal assessment/size: deferred; ostomy supplies had to be ordered and placed at the bedside. Peristomal assessment: deferred Treatment options for stomal/peristomal skin: patient states she uses the pouch only, no barrier rings, no paste. Output: scant brown stool in pouch Ostomy pouching: 1pc. flat Education provided: none This is a long standing ostomy the patient normally cares for herself. I have ordered a one piece, flat, cut to fit pouch, Maria Travis [phone] for use while here.  Her pouch uses a clip for closure and I explained the pouch we can provide is different in how it closes. Val Riles, RN, MSN, CWOCN, CNS-BC, pager [phone]

## 2020-12-04 NOTE — Progress Notes (Signed)
   12/04/20 0014  Assess: MEWS Score  Temp 98.2 F (36.8 C)  BP (!) 141/101  Pulse Rate (!) 120  ECG Heart Rate (!) 120  Resp 20  Level of Consciousness Alert  SpO2 93 %  O2 Device Room Air  Assess: MEWS Score  MEWS Temp 0  MEWS Systolic 0  MEWS Pulse 2  MEWS RR 0  MEWS LOC 0  MEWS Score 2  MEWS Score Color Yellow  Assess: if the MEWS score is Yellow or Red  Were vital signs taken at a resting state? Yes  Focused Assessment No change from prior assessment  Early Detection of Sepsis Score *See Row Information* Low  MEWS guidelines implemented *See Row Information* Yes  Treat  MEWS Interventions Escalated (See documentation below)  Take Vital Signs  Increase Vital Sign Frequency  Yellow: Q 2hr X 2 then Q 4hr X 2, if remains yellow, continue Q 4hrs  Escalate  MEWS: Escalate Yellow: discuss with charge nurse/RN and consider discussing with provider and RRT  Notify: Charge Nurse/RN  Name of Charge Nurse/RN Notified Glenna Durand, RN  Date Charge Nurse/RN Notified 12/04/20  Time Charge Nurse/RN Notified 0020

## 2020-12-05 ENCOUNTER — Inpatient Hospital Stay (HOSPITAL_COMMUNITY): Payer: Medicare Other | Admitting: Certified Registered Nurse Anesthetist

## 2020-12-05 ENCOUNTER — Encounter (HOSPITAL_COMMUNITY)
Admission: EM | Disposition: A | Discharge status: Home Health | Payer: Self-pay | Source: Home / Self Care | Attending: Internal Medicine

## 2020-12-05 ENCOUNTER — Inpatient Hospital Stay (HOSPITAL_COMMUNITY): Payer: Medicare Other

## 2020-12-05 ENCOUNTER — Encounter (HOSPITAL_COMMUNITY): Payer: Self-pay | Admitting: Interventional Cardiology

## 2020-12-05 DIAGNOSIS — I340 Nonrheumatic mitral (valve) insufficiency: Secondary | ICD-10-CM

## 2020-12-05 DIAGNOSIS — I4891 Unspecified atrial fibrillation: Secondary | ICD-10-CM | POA: Diagnosis not present

## 2020-12-05 DIAGNOSIS — I350 Nonrheumatic aortic (valve) stenosis: Secondary | ICD-10-CM

## 2020-12-05 DIAGNOSIS — I5041 Acute combined systolic (congestive) and diastolic (congestive) heart failure: Secondary | ICD-10-CM | POA: Diagnosis not present

## 2020-12-05 HISTORY — PX: CARDIOVERSION: SHX1299

## 2020-12-05 HISTORY — PX: TEE WITHOUT CARDIOVERSION: SHX5443

## 2020-12-05 LAB — LACTIC ACID, PLASMA
Lactic Acid, Venous: 1.6 mmol/L (ref 0.5–1.9)
Lactic Acid, Venous: 1.7 mmol/L (ref 0.5–1.9)

## 2020-12-05 LAB — CBC
HCT: 36.1 % (ref 36.0–46.0)
HCT: 37.1 % (ref 36.0–46.0)
Hemoglobin: 11.4 g/dL — ABNORMAL LOW (ref 12.0–15.0)
Hemoglobin: 11.7 g/dL — ABNORMAL LOW (ref 12.0–15.0)
MCH: 30.3 pg (ref 26.0–34.0)
MCH: 30.5 pg (ref 26.0–34.0)
MCHC: 31.6 g/dL (ref 30.0–36.0)
MCHC: 31.5 g/dL (ref 30.0–36.0)
MCV: 96 fL (ref 80.0–100.0)
MCV: 96.6 fL (ref 80.0–100.0)
Platelets: 190 10*3/uL (ref 150–400)
Platelets: 200 10*3/uL (ref 150–400)
RBC: 3.76 MIL/uL — ABNORMAL LOW (ref 3.87–5.11)
RBC: 3.84 MIL/uL — ABNORMAL LOW (ref 3.87–5.11)
RDW: 15.1 % (ref 11.5–15.5)
RDW: 15.1 % (ref 11.5–15.5)
WBC: 7.7 10*3/uL (ref 4.0–10.5)
WBC: 9.1 10*3/uL (ref 4.0–10.5)
nRBC: 0.4 % — ABNORMAL HIGH (ref 0.0–0.2)
nRBC: 0.4 % — ABNORMAL HIGH (ref 0.0–0.2)

## 2020-12-05 LAB — BASIC METABOLIC PANEL
Anion gap: 10 (ref 5–15)
BUN: 9 mg/dL (ref 8–23)
CO2: 25 mmol/L (ref 22–32)
Calcium: 9.3 mg/dL (ref 8.9–10.3)
Chloride: 107 mmol/L (ref 98–111)
Creatinine, Ser: 1.12 mg/dL — ABNORMAL HIGH (ref 0.44–1.00)
GFR, Estimated: 49 mL/min — ABNORMAL LOW (ref >60)
Glucose, Bld: 100 mg/dL — ABNORMAL HIGH (ref 70–99)
Potassium: 3.4 mmol/L — ABNORMAL LOW (ref 3.5–5.1)
Sodium: 142 mmol/L (ref 135–145)

## 2020-12-05 LAB — LACTATE DEHYDROGENASE: LDH: 155 U/L (ref 98–192)

## 2020-12-05 LAB — MAGNESIUM: Magnesium: 2.1 mg/dL (ref 1.7–2.4)

## 2020-12-05 SURGERY — ECHOCARDIOGRAM, TRANSESOPHAGEAL
Anesthesia: Monitor Anesthesia Care

## 2020-12-05 MED ORDER — POTASSIUM CHLORIDE ER 10 MEQ PO TBCR
20.0000 meq | EXTENDED_RELEASE_TABLET | Freq: Every morning | ORAL | Status: DC
  Administered 2020-12-06 – 2020-12-08 (×3): 20 meq via ORAL
  Filled 2020-12-05 (×5): qty 2

## 2020-12-05 MED ORDER — IOHEXOL 9 MG/ML PO SOLN
ORAL | Status: AC
  Filled 2020-12-05: qty 500

## 2020-12-05 MED ORDER — PROPOFOL 500 MG/50ML IV EMUL
INTRAVENOUS | Status: DC | PRN
  Administered 2020-12-05: 125 ug/kg/min via INTRAVENOUS

## 2020-12-05 MED ORDER — IOHEXOL 300 MG/ML  SOLN
80.0000 mL | Freq: Once | INTRAMUSCULAR | Status: AC | PRN
  Administered 2020-12-05: 80 mL via INTRAVENOUS

## 2020-12-05 MED ORDER — SODIUM CHLORIDE 0.9 % IV SOLN: INTRAVENOUS | Status: DC | PRN

## 2020-12-05 MED ORDER — IPRATROPIUM-ALBUTEROL 0.5-2.5 (3) MG/3ML IN SOLN
3.0000 mL | Freq: Four times a day (QID) | RESPIRATORY_TRACT | Status: DC
  Administered 2020-12-05: 3 mL via RESPIRATORY_TRACT
  Filled 2020-12-05: qty 3

## 2020-12-05 MED ORDER — PROPOFOL 10 MG/ML IV BOLUS
INTRAVENOUS | Status: DC | PRN
  Administered 2020-12-05: 10 mg via INTRAVENOUS

## 2020-12-05 MED ORDER — POTASSIUM CHLORIDE CRYS ER 20 MEQ PO TBCR
40.0000 meq | EXTENDED_RELEASE_TABLET | Freq: Once | ORAL | Status: AC
  Administered 2020-12-05: 40 meq via ORAL
  Filled 2020-12-05: qty 2

## 2020-12-05 MED ORDER — AMIODARONE HCL 200 MG PO TABS
200.0000 mg | ORAL_TABLET | Freq: Two times a day (BID) | ORAL | Status: DC
  Administered 2020-12-05 – 2020-12-08 (×7): 200 mg via ORAL
  Filled 2020-12-05 (×7): qty 1

## 2020-12-05 MED ORDER — SACUBITRIL-VALSARTAN 24-26 MG PO TABS
1.0000 | ORAL_TABLET | Freq: Two times a day (BID) | ORAL | Status: DC
  Administered 2020-12-06 – 2020-12-08 (×5): 1 via ORAL
  Filled 2020-12-05 (×5): qty 1

## 2020-12-05 NOTE — Evaluation (Signed)
Physical Therapy Evaluation Patient Details Name: Maria Travis MRN: [id number] DOB: 17-Oct-1941 Today's Date: 12/05/2020   History of Present Illness  Pt adm with afib with rvr. Underwent cardiac cath 2/10 and cardioversion on 2/11. PMH - afib (new in 12/21, htn, colostomy, recurrent falls.  Clinical Impression  Pt admitted with above diagnosis and presents to PT with functional limitations due to deficits listed below (See PT problem list). Pt needs skilled PT to maximize independence and safety to allow discharge to daughters home with HHPT. Expect pt will make steady improvement toward that goal.      Follow Up Recommendations Home health PT;Supervision for mobility/OOB    Equipment Recommendations  None recommended by PT    Recommendations for Other Services       Precautions / Restrictions Precautions Precautions: Fall      Mobility  Bed Mobility Overal bed mobility: Needs Assistance Bed Mobility: Supine to Sit     Supine to sit: Min assist     General bed mobility comments: Assist to elevate trunk into sitting    Transfers Overall transfer level: Needs assistance Equipment used: Rolling walker (2 wheeled) Transfers: Sit to/from [facility] Sit to Stand: Min assist Stand pivot transfers: Min guard       General transfer comment: Assist for balance. Bed to bsc without assistive device.  Ambulation/Gait Ambulation/Gait assistance: Min guard Gait Distance (Feet): 25 Feet Assistive device: Rolling walker (2 wheeled) Gait Pattern/deviations: Step-through pattern;Decreased stride length;Trunk flexed Gait velocity: decr Gait velocity interpretation: <1.31 ft/sec, indicative of household ambulator General Gait Details: Assist for safety. verbal cues to keep walker with her all the way to the chair  Stairs            Wheelchair Mobility    Modified Rankin (Stroke Patients Only)       Balance Overall balance assessment: Needs  assistance Sitting-balance support: No upper extremity supported;Feet supported Sitting balance-Leahy Scale: Good     Standing balance support: No upper extremity supported;During functional activity Standing balance-Leahy Scale: Fair                               Pertinent Vitals/Pain Pain Assessment: No/denies pain    Home Living Family/patient expects to be discharged to:: Private residence Living Arrangements: Children (Pt lives in [place] but is staying with daughter here.) Available Help at Discharge: Family Type of Home: House Home Access: Stairs to enter Entrance Stairs-Rails: Right Entrance Stairs-Number of Steps: 3-4 Home Layout: [place] [address]: [place] - single point;Walker - 2 wheels      Prior Function Level of Independence: Needs assistance   Gait / Transfers Assistance Needed: amb with cane or walker. Recurrent falls           Hand Dominance        Extremity/Trunk Assessment   Upper Extremity Assessment Upper Extremity Assessment: Defer to OT evaluation    Lower Extremity Assessment Lower Extremity Assessment: Generalized weakness       Communication      Cognition Arousal/Alertness: Awake/alert Behavior During Therapy: WFL for tasks assessed/performed Overall Cognitive Status: No family/caregiver present to determine baseline cognitive functioning Area of Impairment: Orientation;Memory;Following commands;Safety/judgement;Problem solving                 Orientation Level: Disoriented to;Time;Situation   Memory: Decreased short-term memory Following Commands: Follows one step commands consistently;Follows one step commands with increased time Safety/Judgement: Decreased awareness of deficits;Decreased awareness  of safety   Problem Solving: Requires verbal cues;Requires tactile cues;Slow processing        General Comments General comments (skin integrity, edema, etc.): VSS on RA    Exercises      Assessment/Plan    PT Assessment Patient needs continued PT services  PT Problem List Decreased strength;Decreased activity tolerance;Decreased balance;Decreased mobility;Decreased cognition       PT Treatment Interventions DME instruction;Gait training;Functional mobility training;Therapeutic activities;Therapeutic exercise;Balance training;Patient/family education    PT Goals (Current goals can be found in the Care Plan section)  Acute Rehab PT Goals Patient Stated Goal: not stated PT Goal Formulation: With patient Time For Goal Achievement: 12/19/20 Potential to Achieve Goals: Good    Frequency Min 3X/week   Barriers to discharge        Co-evaluation               AM-PAC PT "6 Clicks" Mobility  Outcome Measure Help needed turning from your back to your side while in a flat bed without using bedrails?: A Little Help needed moving from lying on your back to sitting on the side of a flat bed without using bedrails?: A Little Help needed moving to and from a bed to a chair (including a wheelchair)?: A Little Help needed standing up from a chair using your arms (e.g., wheelchair or bedside chair)?: A Little Help needed to walk in hospital room?: A Little Help needed climbing 3-5 steps with a railing? : A Little 6 Click Score: 18    End of Session Equipment Utilized During Treatment: Gait belt Activity Tolerance: Patient tolerated treatment well Patient left: in chair;with call bell/phone within reach;with chair alarm set Nurse Communication: Mobility status PT Visit Diagnosis: Unsteadiness on feet (R26.81);Muscle weakness (generalized) (M62.81);Repeated falls (R29.6)    Time: 9024-0973 PT Time Calculation (min) (ACUTE ONLY): 28 min   Charges:   PT Evaluation $PT Eval Moderate Complexity: 1 Mod PT Treatments $Gait Training: 8-22 mins        [place] Pager [phone] Office [place] 12/05/2020, 4:51 PM

## 2020-12-05 NOTE — Progress Notes (Signed)
  Echocardiogram Echocardiogram Transesophageal has been performed.  Bobbye Charleston 12/05/2020, 11:23 AM

## 2020-12-05 NOTE — H&P (View-Only) (Signed)
Progress Note  Patient Name: Maria Travis Date of Encounter: 12/05/2020  [facility] Cardiologist: No primary care provider on file.   Subjective   Reports continues to have dyspnea, denies any chest pain  Inpatient Medications    Scheduled Meds: . allopurinol  100 mg Oral Daily  . apixaban  2.5 mg Oral BID  . atorvastatin  20 mg Oral QPM  . colchicine  0.6 mg Oral Daily  . folic acid  1 mg Oral q AM  . furosemide  40 mg Intravenous BID  . losartan  25 mg Oral q AM  . metoprolol tartrate  75 mg Oral BID  . pantoprazole  40 mg Oral QAC breakfast  . [START ON 12/06/2020] potassium chloride  20 mEq Oral q AM  . potassium chloride  40 mEq Oral Once  . sodium chloride flush  3 mL Intravenous Q12H  . sodium chloride flush  3 mL Intravenous Q12H  . thiamine  100 mg Oral Daily   Continuous Infusions: . sodium chloride     PRN Meds: sodium chloride, acetaminophen, ondansetron (ZOFRAN) IV, sodium chloride flush   Vital Signs    Vitals:   12/04/20 2300 12/05/20 0037 12/05/20 0628 12/05/20 0823  BP:  (!) 125/98 (!) 140/93 (!) 148/90  Pulse: (!) 128 95 (!) 103 95  Resp: (!) 27 19 20 18   Temp: 97.8 F (36.6 C) 97.7 F (36.5 C)  98.2 F (36.8 C)  TempSrc:  Oral  Oral  SpO2: 95% 96% 95% 95%  Weight:      Height:        Intake/Output Summary (Last 24 hours) at 12/05/2020 0949 Last data filed at 12/04/2020 2155 Gross per 24 hour  Intake 243 ml  Output 1300 ml  Net -1057 ml   Last 3 Weights 12/04/2020 12/02/2020 10/18/2020  Weight (lbs) 121 lb 11.1 oz 122 lb 126 lb 1.7 oz  Weight (kg) 55.2 kg 55.339 kg 57.2 kg      Telemetry    AF with rates 90-10ss currently - Personally Reviewed  ECG    No new ECG - Personally Reviewed  Physical Exam   GEN: No acute distress.   Neck: + JVD Cardiac: tachycardic, irregular, no murmurs Respiratory: CTAB GI: Soft, nontender MS: No edema Neuro:  Nonfocal  Psych: Normal affect   Labs    High Sensitivity Troponin:  No  results for input(s): TROPONINIHS in the last 720 hours.    Chemistry Recent Labs  Lab 12/02/20 1049 12/02/20 2206 12/03/20 1041 12/04/20 0442 12/05/20 0046  NA 143  --  138 145 142  K 3.8  --  3.1* 4.0 3.4*  CL 111  --  106 112* 107  CO2 23  --  22 21* 25  GLUCOSE 115*  --  235* 110* 100*  BUN 15  --  9 11 9   CREATININE 1.17*   < > 0.96 1.15* 1.12*  CALCIUM 9.5  --  8.6* 9.4 9.3  PROT 6.6  --   --   --   --   ALBUMIN 3.5  --   --   --   --   AST 21  --   --   --   --   ALT 20  --   --   --   --   ALKPHOS 56  --   --   --   --   BILITOT 0.8  --   --   --   --  GFRNONAA 47*   < > 59* 48* 49*  ANIONGAP 9  --  10 12 10    < > = values in this interval not displayed.     Hematology Recent Labs  Lab 12/02/20 2206 12/04/20 0442 12/05/20 0046  WBC 6.7 7.7 7.7  RBC 3.47* 3.69* 3.76*  HGB 10.8* 11.6* 11.4*  HCT 35.4* 35.5* 36.1  MCV 102.0* 96.2 96.0  MCH 31.1 31.4 30.3  MCHC 30.5 32.7 31.6  RDW 15.4 15.4 15.1  PLT 171 175 190    BNP Recent Labs  Lab 12/02/20 1051  BNP 768.8*     DDimer No results for input(s): DDIMER in the last 168 hours.   Radiology    CARDIAC CATHETERIZATION  Result Date: 12/04/2020  Technically complicated procedure, predominantly because of inability to complete right heart cath.  Unsuccessful attempt at right heart cath from right brachial approach.  Right femoral approach was complicated by inability to advance into the pulmonary artery for pressure measurement and wedge recording.  The patient became confused and agitated causing [place] to abort the right heart phase of the procedure.  PA systolic pressure 41 mmHg based upon the documented right ventricular pressure.  Elevated right atrial mean pressure of 18 mmHg.  Normal right coronary, nondominant  Calcified ostial left main without obstruction.  Widely patent left anterior descending  Widely patent circumflex artery with TIMI grade II flow.  Because of reduced coronary flow, only 1  left coronary injection was performed.  No angiographic evidence of air embolism or thrombus.  Patient experienced no chest pain.  Altered mental status after 1 mg of IV Versed. RECOMMENDATIONS:  Plan Per treating team.  MR ABDOMEN MRCP W WO CONTAST  Result Date: 12/04/2020 CLINICAL DATA:  Abdominal pain.  Biliary obstruction suspected. EXAM: MRI ABDOMEN WITHOUT AND WITH CONTRAST (INCLUDING MRCP) TECHNIQUE: Multiplanar multisequence MR imaging of the abdomen was performed both before and after the administration of intravenous contrast. Heavily T2-weighted images of the biliary and pancreatic ducts were obtained, and three-dimensional MRCP images were rendered by post processing. CONTRAST:  35mL GADAVIST GADOBUTROL 1 MMOL/ML IV SOLN COMPARISON:  CT scan in 12/02/2020 FINDINGS: Markedly motion degraded exam. Patient was experiencing some confusion during the study and was unable to reproducibly breath hold. Lower chest: Tiny T2 hyperintensities in segment IV are likely benign and probably cysts. Hepatobiliary: Postcontrast imaging of the liver shows no large gross parenchymal lesion although small or subtle lesions could be obscured due to the substantial motion degradation. Gallbladder surgically absent. Periportal edema is associated with mild intra and extrahepatic biliary duct distension. Extrahepatic common duct measures 8 mm diameter, upper normal for patient age. Common bile duct in the head of pancreas measures 6 mm, within normal limits for age. No obstructing mass lesion or choledocholithiasis evident. Pancreas: Main pancreatic duct is diffusely dilated up to 6 mm diameter in the head of pancreas. The ductal dilatation extends to the level of the ampulla. No obstructing mass lesion or obstructing stone evident although motion degradation limits assessment. Spleen:  No splenomegaly. No focal mass lesion. Adrenals/Urinary Tract: No adrenal nodule or mass. Adjacent cystic lesions in the lower pole right  kidney measure up to 2.2 cm. These cannot be definitively characterized due to motion degradation. Stomach/Bowel: Stomach is nondistended. No small bowel or colonic dilatation within the visualized abdomen. Vascular/Lymphatic: No abdominal aortic aneurysm. No gross abdominal lymphadenopathy. Other: Small volume intraperitoneal free fluid with diffuse body wall edema. Musculoskeletal: Complex midline ventral and left paramidline parastomal  hernias containing small bowel and colon, incompletely visualized. IMPRESSION: 1. Markedly motion degraded exam. Postcontrast imaging of solid organ anatomy or is nondiagnostic. 2. Mild intra and extrahepatic biliary duct distension with no evidence for choledocholithiasis. No gross obstructing mass lesion in the head of pancreas. 3. Diffuse dilatation of the main pancreatic duct up to 6 mm diameter. No obstructing mass lesion or obstructing stone evident although study is markedly motion degraded. Repeat imaging after resolution of acute symptoms when patient is better able to cooperate with positioning and breath holding may prove helpful. 4. Small volume intraperitoneal free fluid with diffuse body wall edema. 5. Complex ventral and left paramidline parastomal hernias containing small bowel and colon. These were better assessed on recent CT scan. Electronically Signed   By: Misty Stanley M.D.   On: 12/04/2020 05:38    Cardiac Studies     Patient Profile     80 y.o. female with a history of hypertension, hyperlipidemia,pelvic abscess with sigmoid resectionandformation of colostomy,compression fracture with moderate stenosisand recent dx of PAF seen 09/2020 not on anticoagulation secondary to recurrent falls who is being seen today for the evaluation of PAF and CHF   Assessment & Plan    Paroxysmal Atrial Fibrillation: p/w atrial fibrillation with RVR on arrival. AF was diagnosed during admission in 09/2020. CHA2DS2-VASC =4 (HTN, age x2, female).  She was not  anticoagulated at that time due to fall risk. Echo in December showedLVEF of 55-60% with normal wall motion.  Had another admit in [place] with Afib with RVR in January, and was started on Eliquis 2.5 mg BID at that time.  During both admissions, converted spontaneously to NSR.  Now presenting with AF with RVR, and echo shows new systolic dysfunction,  EF 25-30%. -Continue metoprolol 75 mg BID -Continue Eliquis 2.5 mg BID -TEE/DCCV planned for today -After careful review of history and examination, the risks and benefits of transesophageal echocardiogram have been explained including risks of esophageal damage, perforation (1:10,000 risk), bleeding, pharyngeal hematoma as well as other potential complications associated with conscious sedation including aspiration, arrhythmia, respiratory failure and death. Alternatives to treatment were discussed, questions were answered. Patient is willing to proceed. Discussed with both patient and daughter and they are in agreement with proceeding.  Acute combined systolic and diastolic heart failure: EF 25-30% on echo 2/9.  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  There does appear to be some regional wall motion abnormalities though with septal akinesis, and she reports exertional chest pain, so CAD on ddx.  Cath on 2/10 did not show any CAD.  Limited RHC as unable to advance catheter into pulmonary artery, but RAP 18 -Elevated pressures on RHC yesterday, continue IV lasix 40 mg BID -Continue metoprolol, will consolidate to XL on discharge -On losartan, will switch to entresto 24-26 mg BID for tomorrow  Abdominal pain/Enterocolitis: management per primary team  For questions or updates, please contact [facility] Please consult [email or web address] for contact info under        Signed, Donato Heinz, MD  12/05/2020, 9:49 AM

## 2020-12-05 NOTE — Interval H&P Note (Signed)
History and Physical Interval Note:  12/05/2020 10:14 AM  [facility]  has presented today for surgery, with the diagnosis of AFIB.  The various methods of treatment have been discussed with the patient and family. After consideration of risks, benefits and other options for treatment, the patient has consented to  Procedure(s): TRANSESOPHAGEAL ECHOCARDIOGRAM (TEE) (N/A) CARDIOVERSION (N/A) as a surgical intervention.  The patient's history has been reviewed, patient examined, no change in status, stable for surgery.  I have reviewed the patient's chart and labs.  Questions were answered to the patient's satisfaction.     Maria Travis

## 2020-12-05 NOTE — Op Note (Signed)
Procedure: Electrical Cardioversion Indications:  Atrial Fibrillation  Procedure Details:  Consent: Risks of procedure as well as the alternatives and risks of each were explained to the (patient/caregiver).  Consent for procedure obtained.  Time Out: Verified patient identification, verified procedure, site/side was marked, verified correct patient position, special equipment/implants available, medications/allergies/relevent history reviewed, required imaging and test results available.  Performed  Patient placed on cardiac monitor, pulse oximetry, supplemental oxygen as necessary.  Sedation given: propofol IV Pacer pads placed anterior and posterior chest.  Cardioverted 1 time(s).  Cardioversion with synchronized biphasic 120J shock.  Evaluation: Findings: Post procedure EKG shows: NSR Complications: None Patient did tolerate procedure well.  Time Spent Directly with the Patient:  30 minutes   Prynce Jacober 12/05/2020, 11:20 AM

## 2020-12-05 NOTE — Op Note (Signed)
INDICATIONS: atrial fibrillation  PROCEDURE:   Informed consent was obtained prior to the procedure. The risks, benefits and alternatives for the procedure were discussed and the patient comprehended these risks.  Risks include, but are not limited to, cough, sore throat, vomiting, nausea, somnolence, esophageal and stomach trauma or perforation, bleeding, low blood pressure, aspiration, pneumonia, infection, trauma to the teeth and death.    After a procedural time-out, the oropharynx was anesthetized with 20% benzocaine spray.   During this procedure the patient was administered IV propofol by Anesthesiology, Dr. Lissa Hoard.  The transesophageal probe was inserted in the esophagus and stomach without difficulty and multiple views were obtained.  The patient was kept under observation until the patient left the procedure room.  The patient left the procedure room in stable condition.   Agitated microbubble saline contrast was not administered.  COMPLICATIONS:    There were no immediate complications.  FINDINGS:  Global LV hypokinesis, EF 30%. Moderately dilated and mildly depressed right ventricle. 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 Severe spontaneous echo contrast in the left atrial appendage, but no formed thrombus. Severely decrease appendage flow velocities. Severe spontaneous echo contrast in the descending thoracic aorta consistent with reduced cardiac output.  RECOMMENDATIONS:     Proceed with cardioversion. Risk of embolic stroke is increased, but there is evidence of severe cardiomyopathy and reduced output. She would benefit from restoration of normal rhythm.  Time Spent Directly with the Patient:  30 minutes   Shunda Rabadi 12/05/2020, 11:14 AM

## 2020-12-05 NOTE — Anesthesia Postprocedure Evaluation (Signed)
Anesthesia Post Note  Patient: [facility]  Procedure(s) Performed: TRANSESOPHAGEAL ECHOCARDIOGRAM (TEE) (N/A ) CARDIOVERSION (N/A )     Patient location during evaluation: PACU Anesthesia Type: MAC Level of consciousness: awake and alert Pain management: pain level controlled Vital Signs Assessment: post-procedure vital signs reviewed and stable Respiratory status: spontaneous breathing Cardiovascular status: stable Anesthetic complications: no   No complications documented.  Last Vitals:  Vitals:   12/05/20 1200 12/05/20 1243  BP: (!) 145/85 (!) 114/58  Pulse: 81 85  Resp: 20 20  Temp: 36.4 C 36.4 C  SpO2: 97% 96%    Last Pain:  Vitals:   12/05/20 1243  TempSrc: Oral  PainSc:                  Nolon Nations

## 2020-12-05 NOTE — Transfer of Care (Signed)
Immediate Anesthesia Transfer of Care Note  Patient: Maria Travis  Procedure(s) Performed: TRANSESOPHAGEAL ECHOCARDIOGRAM (TEE) (N/A ) CARDIOVERSION (N/A )  Patient Location: Endoscopy Unit  Anesthesia Type:MAC  Level of Consciousness: drowsy and patient cooperative  Airway & Oxygen Therapy: Patient Spontanous Breathing  Post-op Assessment: Report given to RN and Post -op Vital signs reviewed and stable  Post vital signs: Reviewed and stable  Last Vitals:  Vitals Value Taken Time  BP 125/66 12/05/20 1119  Temp    Pulse 74 12/05/20 1120  Resp 18 12/05/20 1120  SpO2 98 % 12/05/20 1120  Vitals shown include unvalidated device data.  Last Pain:  Vitals:   12/05/20 1032  TempSrc:   PainSc: 0-No pain         Complications: No complications documented.

## 2020-12-05 NOTE — Progress Notes (Addendum)
Progress Note  Patient Name: Maria Travis Flight Date of Encounter: 12/05/2020  [facility] Cardiologist: No primary care provider on file.   Subjective   Reports continues to have dyspnea, denies any chest pain  Inpatient Medications    Scheduled Meds: . allopurinol  100 mg Oral Daily  . apixaban  2.5 mg Oral BID  . atorvastatin  20 mg Oral QPM  . colchicine  0.6 mg Oral Daily  . folic acid  1 mg Oral q AM  . furosemide  40 mg Intravenous BID  . losartan  25 mg Oral q AM  . metoprolol tartrate  75 mg Oral BID  . pantoprazole  40 mg Oral QAC breakfast  . [START ON 12/06/2020] potassium chloride  20 mEq Oral q AM  . potassium chloride  40 mEq Oral Once  . sodium chloride flush  3 mL Intravenous Q12H  . sodium chloride flush  3 mL Intravenous Q12H  . thiamine  100 mg Oral Daily   Continuous Infusions: . sodium chloride     PRN Meds: sodium chloride, acetaminophen, ondansetron (ZOFRAN) IV, sodium chloride flush   Vital Signs    Vitals:   12/04/20 2300 12/05/20 0037 12/05/20 0628 12/05/20 0823  BP:  (!) 125/98 (!) 140/93 (!) 148/90  Pulse: (!) 128 95 (!) 103 95  Resp: (!) 27 19 20 18   Temp: 97.8 F (36.6 C) 97.7 F (36.5 C)  98.2 F (36.8 C)  TempSrc:  Oral  Oral  SpO2: 95% 96% 95% 95%  Weight:      Height:        Intake/Output Summary (Last 24 hours) at 12/05/2020 0949 Last data filed at 12/04/2020 2155 Gross per 24 hour  Intake 243 ml  Output 1300 ml  Net -1057 ml   Last 3 Weights 12/04/2020 12/02/2020 10/18/2020  Weight (lbs) 121 lb 11.1 oz 122 lb 126 lb 1.7 oz  Weight (kg) 55.2 kg 55.339 kg 57.2 kg      Telemetry    AF with rates 90-10ss currently - Personally Reviewed  ECG    No new ECG - Personally Reviewed  Physical Exam   GEN: No acute distress.   Neck: + JVD Cardiac: tachycardic, irregular, no murmurs Respiratory: CTAB GI: Soft, nontender MS: No edema Neuro:  Nonfocal  Psych: Normal affect   Labs    High Sensitivity Troponin:  No  results for input(s): TROPONINIHS in the last 720 hours.    Chemistry Recent Labs  Lab 12/02/20 1049 12/02/20 2206 12/03/20 1041 12/04/20 0442 12/05/20 0046  NA 143  --  138 145 142  K 3.8  --  3.1* 4.0 3.4*  CL 111  --  106 112* 107  CO2 23  --  22 21* 25  GLUCOSE 115*  --  235* 110* 100*  BUN 15  --  9 11 9   CREATININE 1.17*   < > 0.96 1.15* 1.12*  CALCIUM 9.5  --  8.6* 9.4 9.3  PROT 6.6  --   --   --   --   ALBUMIN 3.5  --   --   --   --   AST 21  --   --   --   --   ALT 20  --   --   --   --   ALKPHOS 56  --   --   --   --   BILITOT 0.8  --   --   --   --  GFRNONAA 47*   < > 59* 48* 49*  ANIONGAP 9  --  10 12 10    < > = values in this interval not displayed.     Hematology Recent Labs  Lab 12/02/20 2206 12/04/20 0442 12/05/20 0046  WBC 6.7 7.7 7.7  RBC 3.47* 3.69* 3.76*  HGB 10.8* 11.6* 11.4*  HCT 35.4* 35.5* 36.1  MCV 102.0* 96.2 96.0  MCH 31.1 31.4 30.3  MCHC 30.5 32.7 31.6  RDW 15.4 15.4 15.1  PLT 171 175 190    BNP Recent Labs  Lab 12/02/20 1051  BNP 768.8*     DDimer No results for input(s): DDIMER in the last 168 hours.   Radiology    CARDIAC CATHETERIZATION  Result Date: 12/04/2020  Technically complicated procedure, predominantly because of inability to complete right heart cath.  Unsuccessful attempt at right heart cath from right brachial approach.  Right femoral approach was complicated by inability to advance into the pulmonary artery for pressure measurement and wedge recording.  The patient became confused and agitated causing [place] to abort the right heart phase of the procedure.  PA systolic pressure 41 mmHg based upon the documented right ventricular pressure.  Elevated right atrial mean pressure of 18 mmHg.  Normal right coronary, nondominant  Calcified ostial left main without obstruction.  Widely patent left anterior descending  Widely patent circumflex artery with TIMI grade II flow.  Because of reduced coronary flow, only 1  left coronary injection was performed.  No angiographic evidence of air embolism or thrombus.  Patient experienced no chest pain.  Altered mental status after 1 mg of IV Versed. RECOMMENDATIONS:  Plan Per treating team.  MR ABDOMEN MRCP W WO CONTAST  Result Date: 12/04/2020 CLINICAL DATA:  Abdominal pain.  Biliary obstruction suspected. EXAM: MRI ABDOMEN WITHOUT AND WITH CONTRAST (INCLUDING MRCP) TECHNIQUE: Multiplanar multisequence MR imaging of the abdomen was performed both before and after the administration of intravenous contrast. Heavily T2-weighted images of the biliary and pancreatic ducts were obtained, and three-dimensional MRCP images were rendered by post processing. CONTRAST:  26mL GADAVIST GADOBUTROL 1 MMOL/ML IV SOLN COMPARISON:  CT scan in 12/02/2020 FINDINGS: Markedly motion degraded exam. Patient was experiencing some confusion during the study and was unable to reproducibly breath hold. Lower chest: Tiny T2 hyperintensities in segment IV are likely benign and probably cysts. Hepatobiliary: Postcontrast imaging of the liver shows no large gross parenchymal lesion although small or subtle lesions could be obscured due to the substantial motion degradation. Gallbladder surgically absent. Periportal edema is associated with mild intra and extrahepatic biliary duct distension. Extrahepatic common duct measures 8 mm diameter, upper normal for patient age. Common bile duct in the head of pancreas measures 6 mm, within normal limits for age. No obstructing mass lesion or choledocholithiasis evident. Pancreas: Main pancreatic duct is diffusely dilated up to 6 mm diameter in the head of pancreas. The ductal dilatation extends to the level of the ampulla. No obstructing mass lesion or obstructing stone evident although motion degradation limits assessment. Spleen:  No splenomegaly. No focal mass lesion. Adrenals/Urinary Tract: No adrenal nodule or mass. Adjacent cystic lesions in the lower pole right  kidney measure up to 2.2 cm. These cannot be definitively characterized due to motion degradation. Stomach/Bowel: Stomach is nondistended. No small bowel or colonic dilatation within the visualized abdomen. Vascular/Lymphatic: No abdominal aortic aneurysm. No gross abdominal lymphadenopathy. Other: Small volume intraperitoneal free fluid with diffuse body wall edema. Musculoskeletal: Complex midline ventral and left paramidline parastomal  hernias containing small bowel and colon, incompletely visualized. IMPRESSION: 1. Markedly motion degraded exam. Postcontrast imaging of solid organ anatomy or is nondiagnostic. 2. Mild intra and extrahepatic biliary duct distension with no evidence for choledocholithiasis. No gross obstructing mass lesion in the head of pancreas. 3. Diffuse dilatation of the main pancreatic duct up to 6 mm diameter. No obstructing mass lesion or obstructing stone evident although study is markedly motion degraded. Repeat imaging after resolution of acute symptoms when patient is better able to cooperate with positioning and breath holding may prove helpful. 4. Small volume intraperitoneal free fluid with diffuse body wall edema. 5. Complex ventral and left paramidline parastomal hernias containing small bowel and colon. These were better assessed on recent CT scan. Electronically Signed   By: Misty Stanley M.D.   On: 12/04/2020 05:38    Cardiac Studies     Patient Profile     80 y.o. female with a history of hypertension, hyperlipidemia,pelvic abscess with sigmoid resectionandformation of colostomy,compression fracture with moderate stenosisand recent dx of PAF seen 09/2020 not on anticoagulation secondary to recurrent falls who is being seen today for the evaluation of PAF and CHF   Assessment & Plan    Paroxysmal Atrial Fibrillation: p/w atrial fibrillation with RVR on arrival. AF was diagnosed during admission in 09/2020. CHA2DS2-VASC =4 (HTN, age x2, female).  She was not  anticoagulated at that time due to fall risk. Echo in December showedLVEF of 55-60% with normal wall motion.  Had another admit in [place] with Afib with RVR in January, and was started on Eliquis 2.5 mg BID at that time.  During both admissions, converted spontaneously to NSR.  Now presenting with AF with RVR, and echo shows new systolic dysfunction,  EF 25-30%. -Continue metoprolol 75 mg BID -Continue Eliquis 2.5 mg BID -TEE/DCCV planned for today -After careful review of history and examination, the risks and benefits of transesophageal echocardiogram have been explained including risks of esophageal damage, perforation (1:10,000 risk), bleeding, pharyngeal hematoma as well as other potential complications associated with conscious sedation including aspiration, arrhythmia, respiratory failure and death. Alternatives to treatment were discussed, questions were answered. Patient is willing to proceed. Discussed with both patient and daughter and they are in agreement with proceeding.  Addendum: Underwent successful cardioversion to sinus rhythm.  Has severe TR and right atrial enlargement.  Will start on amiodarone to maintain sinus rhythm.  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  There does appear to be some regional wall motion abnormalities though with septal akinesis, and she reports exertional chest pain, so CAD on ddx.  Cath on 2/10 did not show any CAD.  Limited RHC as unable to advance catheter into pulmonary artery, but RAP 18 -Elevated pressures on RHC yesterday, continue IV lasix 40 mg BID -Continue metoprolol, will consolidate to XL on discharge -On losartan, will switch to entresto 24-26 mg BID for tomorrow  Abdominal pain/Enterocolitis: management per primary team  For questions or updates, please contact [facility] Please consult [email or web address] for contact info under        Signed, Donato Heinz, MD  12/05/2020, 9:49 AM

## 2020-12-05 NOTE — Progress Notes (Signed)
Progress Note  Patient Name: Maria Travis Date of Encounter: 12/06/2020  [facility] Cardiologist: No primary care provider on file.   Subjective   Continue to have LLQ pain. Asking when she gets to go home. No chest pain.   TEE performed yesterday which revealed EF 30% (global hypokinesis), moderately dilated RV, severe TR, small PFO, severe spontaneous echo contrast in LAA but no thrombus and severe spontaneous echo contrast in the descending aorta c/w low flow state  Successful DCCV with return to NSR  Net negative 792  Inpatient Medications    Scheduled Meds: . allopurinol  100 mg Oral Daily  . amiodarone  200 mg Oral BID  . apixaban  2.5 mg Oral BID  . atorvastatin  20 mg Oral QPM  . colchicine  0.6 mg Oral Daily  . folic acid  1 mg Oral q AM  . furosemide  40 mg Intravenous BID  . metoprolol tartrate  75 mg Oral BID  . pantoprazole  40 mg Oral QAC breakfast  . potassium chloride  20 mEq Oral q AM  . sacubitril-valsartan  1 tablet Oral BID  . sodium chloride flush  3 mL Intravenous Q12H  . sodium chloride flush  3 mL Intravenous Q12H  . thiamine  100 mg Oral Daily   Continuous Infusions: . sodium chloride 500 mL (12/05/20 1036)   PRN Meds: sodium chloride, acetaminophen, ipratropium-albuterol, ondansetron (ZOFRAN) IV, sodium chloride flush   Vital Signs    Vitals:   12/05/20 2227 12/05/20 2351 12/05/20 2352 12/06/20 0353  BP: (!) 130/94 136/90  129/87  Pulse: 86 83  89  Resp:  (!) 21  (!) 22  Temp:   98.4 F (36.9 C) 98.1 F (36.7 C)  TempSrc:   Axillary Oral  SpO2:  98%  97%  Weight:    57.1 kg  Height:        Intake/Output Summary (Last 24 hours) at 12/06/2020 0815 Last data filed at 12/06/2020 0402 Gross per 24 hour  Intake 508 ml  Output 1300 ml  Net -792 ml   Last 3 Weights 12/06/2020 12/04/2020 12/02/2020  Weight (lbs) 125 lb 14.1 oz 121 lb 11.1 oz 122 lb  Weight (kg) 57.1 kg 55.2 kg 55.339 kg      Telemetry    NSR; occasional PVCs  - Personally Reviewed  ECG    No new tracing - Personally Reviewed  Physical Exam   GEN: No acute distress.   Neck: Prominent V-wave in JVP Cardiac: RRR, 1/6 systolic murmur. No rubs, or gallops.  Respiratory: Clear to auscultation bilaterally. GI: Soft, TTP in LLQ MS: No edema; No deformity. Neuro:  Nonfocal  Psych: Normal affect   Labs    High Sensitivity Troponin:  No results for input(s): TROPONINIHS in the last 720 hours.    Chemistry Recent Labs  Lab 12/02/20 1049 12/02/20 2206 12/03/20 1041 12/04/20 0442 12/05/20 0046  NA 143  --  138 145 142  K 3.8  --  3.1* 4.0 3.4*  CL 111  --  106 112* 107  CO2 23  --  22 21* 25  GLUCOSE 115*  --  235* 110* 100*  BUN 15  --  9 11 9   CREATININE 1.17*   < > 0.96 1.15* 1.12*  CALCIUM 9.5  --  8.6* 9.4 9.3  PROT 6.6  --   --   --   --   ALBUMIN 3.5  --   --   --   --  AST 21  --   --   --   --   ALT 20  --   --   --   --   ALKPHOS 56  --   --   --   --   BILITOT 0.8  --   --   --   --   GFRNONAA 47*   < > 59* 48* 49*  ANIONGAP 9  --  10 12 10    < > = values in this interval not displayed.     Hematology Recent Labs  Lab 12/04/20 0442 12/05/20 0046 12/05/20 1755  WBC 7.7 7.7 9.1  RBC 3.69* 3.76* 3.84*  HGB 11.6* 11.4* 11.7*  HCT 35.5* 36.1 37.1  MCV 96.2 96.0 96.6  MCH 31.4 30.3 30.5  MCHC 32.7 31.6 31.5  RDW 15.4 15.1 15.1  PLT 175 190 200    BNP Recent Labs  Lab 12/02/20 1051  BNP 768.8*     DDimer No results for input(s): DDIMER in the last 168 hours.   Radiology    CT ABDOMEN PELVIS W CONTRAST  Result Date: 12/05/2020 CLINICAL DATA:  Abdominal pain. EXAM: CT ABDOMEN AND PELVIS WITH CONTRAST TECHNIQUE: Multidetector CT imaging of the abdomen and pelvis was performed using the standard protocol following bolus administration of intravenous contrast. CONTRAST:  52mL OMNIPAQUE IOHEXOL 300 MG/ML  SOLN COMPARISON:  December 02, 2020 FINDINGS: Lower chest: Mild areas of atelectasis and/or infiltrate are  seen within the bilateral lung bases. Small bilateral pleural effusions are noted. There is stable moderate severity cardiomegaly. Hepatobiliary: No focal liver abnormality is seen. Status post cholecystectomy. Mild, stable central and extrahepatic biliary dilatation is noted. Pancreas: There is mild, stable pancreatic duct dilatation. The remainder of the pancreatic parenchyma is otherwise unremarkable. Spleen: Normal in size without focal abnormality. Adrenals/Urinary Tract: A stable mildly hyperdense 9 mm nodule is seen within the lateral limb of the right adrenal gland. The left adrenal gland is normal in appearance. Kidneys are normal in size, without renal calculi or hydronephrosis. Multiple stable simple cysts are seen within the right kidney. Bladder is unremarkable. Stomach/Bowel: Stomach is within normal limits. Appendix appears normal. No evidence of bowel wall thickening, distention, or inflammatory changes. Vascular/Lymphatic: Moderate severity aortic calcification. No enlarged abdominal or pelvic lymph nodes. Reproductive: Stable parenchymal calcifications are seen within the uterine fundus. The bilateral adnexa are unremarkable. Other: A 5.5 cm x 1.7 cm soft tissue defect is seen along the anterior aspect of the right groin. There is a left lower quadrant ostomy site. A stable, approximately 12.7 cm x 7.4 cm parastomal hernia is noted. A stable ventral defect is seen along the anterior pelvic wall. A small amount of fluid is seen within the mid to upper left abdomen. This is decreased in severity when compared to the prior study. Musculoskeletal: Multilevel degenerative changes are seen throughout the lumbar spine. IMPRESSION: 1. Mild bibasilar atelectasis and/or infiltrate with small bilateral pleural effusions. 2. Mildly hyperdense right adrenal nodule. Correlation with follow-up outpatient adrenal CT or MRI is recommended. 3. Stable ventral and left parastomal hernias. 4. Small amount of fluid  within the mid to upper left abdomen, decreased in severity when compared to the prior study. 5. Aortic atherosclerosis. Aortic Atherosclerosis (ICD10-I70.0). Electronically Signed   By: Virgina Norfolk M.D.   On: 12/05/2020 21:26   CARDIAC CATHETERIZATION  Result Date: 12/04/2020  Technically complicated procedure, predominantly because of inability to complete right heart cath.  Unsuccessful attempt at right heart cath from  right brachial approach.  Right femoral approach was complicated by inability to advance into the pulmonary artery for pressure measurement and wedge recording.  The patient became confused and agitated causing [place] to abort the right heart phase of the procedure.  PA systolic pressure 41 mmHg based upon the documented right ventricular pressure.  Elevated right atrial mean pressure of 18 mmHg.  Normal right coronary, nondominant  Calcified ostial left main without obstruction.  Widely patent left anterior descending  Widely patent circumflex artery with TIMI grade II flow.  Because of reduced coronary flow, only 1 left coronary injection was performed.  No angiographic evidence of air embolism or thrombus.  Patient experienced no chest pain.  Altered mental status after 1 mg of IV Versed. RECOMMENDATIONS:  Plan Per treating team.  DG CHEST PORT 1 VIEW  Result Date: 12/05/2020 CLINICAL DATA:  Cough and tachycardia. EXAM: PORTABLE CHEST 1 VIEW COMPARISON:  12/02/2020 FINDINGS: The heart is enlarged but appears stable. There is tortuosity and mild calcification of the thoracic aorta. Mild vascular congestion and streaky basilar atelectasis but no definite infiltrates or edema. Possible small effusions. The bony thorax is intact. IMPRESSION: Cardiac enlargement with vascular congestion and possible small effusions. Electronically Signed   By: Marijo Sanes M.D.   On: 12/05/2020 17:42   ECHO TEE  Result Date: 12/05/2020    TRANSESOPHOGEAL ECHO REPORT   Patient Name:   Maria Travis Date of Exam: 12/05/2020 Medical Rec #:  [id number]        Height:       60.0 in Accession #:    [id number]       Weight:       121.7 lb Date of Birth:  08-Sep-1941        BSA:          1.511 m Patient Age:    80 years         BP:           141/97 mmHg Patient Gender: F                HR:           111 bpm. Exam Location:  Inpatient Procedure: Transesophageal Echo, Cardiac Doppler and Color Doppler Indications:     I48.91* Unspeicified atrial fibrillation  History:         Patient has prior history of Echocardiogram examinations, most                  recent 12/03/2020. Abnormal ECG, Arrythmias:Atrial Fibrillation,                  Signs/Symptoms:Syncope and Dyspnea; Risk Factors:Hypertension                  and Dyslipidemia.  Sonographer:     Roseanna Rainbow RDCS Referring Phys:  [place] Diagnosing Phys: Sanda Klein MD PROCEDURE: After discussion of the risks and benefits of a TEE, an informed consent was obtained from the patient. The transesophogeal probe was passed without difficulty through the esophogus of the patient. Imaged were obtained with the patient in a left lateral decubitus position. Sedation performed by different physician. The patient was monitored while under deep sedation. Anesthestetic sedation was provided intravenously by Anesthesiology: 260mg  of Propofol. The patient developed no complications during the procedure. A successful direct current cardioversion was performed at 120 joules with 1 attempt. IMPRESSIONS  1. Left ventricular ejection fraction, by estimation, is 25 to 30%. The  left ventricle has severely decreased function. The left ventricle demonstrates global hypokinesis. Left ventricular diastolic function could not be evaluated.  2. Right ventricular systolic function is moderately reduced. The right ventricular size is moderately enlarged. There is mildly elevated pulmonary artery systolic pressure.  3. Left atrial size was severely dilated. No left atrial/left  atrial appendage thrombus was detected. The LAA emptying velocity was 10 cm/s.  4. Right atrial size was severely dilated.  5. The mitral valve is normal in structure. Mild mitral valve regurgitation. No evidence of mitral stenosis.  6. There is severe tricuspid leaflet malcoaptation. The tricuspid valve is abnormal. Tricuspid valve regurgitation is severe.  7. Aortic vale area by planimetry is 1.45 cm sq.. The aortic valve is tricuspid. There is moderate thickening of the aortic valve. Aortic valve regurgitation is mild to moderate. Mild aortic valve stenosis.  8. There is severe spontaneous echo contrast in the aortic arch and descending aorta, suggestive of low cardiac ouput.  9. Tiny bidirectional interatrial shunt is seen. There is a small patent foramen ovale. FINDINGS  Left Ventricle: Left ventricular ejection fraction, by estimation, is 25 to 30%. The left ventricle has severely decreased function. The left ventricle demonstrates global hypokinesis. The left ventricular internal cavity size was normal in size. There is no left ventricular hypertrophy. Left ventricular diastolic function could not be evaluated due to atrial fibrillation. Left ventricular diastolic function could not be evaluated. Right Ventricle: The right ventricular size is moderately enlarged. No increase in right ventricular wall thickness. Right ventricular systolic function is moderately reduced. There is mildly elevated pulmonary artery systolic pressure. The tricuspid regurgitant velocity is 2.50 m/s, and with an assumed right atrial pressure of 15 mmHg, the estimated right ventricular systolic pressure is 73.7 mmHg. Left Atrium: Left atrial size was severely dilated. Spontaneous echo contrast was present in the left atrial appendage. No left atrial/left atrial appendage thrombus was detected. The LAA emptying velocity was 10 cm/s. Right Atrium: Right atrial size was severely dilated. Pericardium: There is no evidence of pericardial  effusion. Mitral Valve: The mitral valve is normal in structure. Mild mitral valve regurgitation, with centrally-directed jet. No evidence of mitral valve stenosis. Tricuspid Valve: There is severe tricuspid leaflet malcoaptation. The tricuspid valve is abnormal. Tricuspid valve regurgitation is severe. No evidence of tricuspid stenosis. Aortic Valve: Aortic vale area by planimetry is 1.45 cm sq. The aortic valve is tricuspid. There is moderate thickening of the aortic valve. Aortic valve regurgitation is mild to moderate. Mild aortic stenosis is present. Pulmonic Valve: The pulmonic valve was normal in structure. Pulmonic valve regurgitation is not visualized. Aorta: There is severe spontaneous echo contrast in the aortic arch and descending aorta, suggestive of low cardiac ouput. The aortic root, ascending aorta, aortic arch and descending aorta are all structurally normal, with no evidence of dilitation or obstruction. There is minimal (Grade I) plaque. IAS/Shunts: Tiny bidirectional interatrial shunt is seen. A small patent foramen ovale is detected.  TRICUSPID VALVE TR Peak grad:   25.0 mmHg TR Vmax:        250.00 cm/s Sanda Klein MD Electronically signed by Sanda Klein MD Signature Date/Time: 12/05/2020/4:10:07 PM    Final     Cardiac Studies   TEE 12/05/20: IMPRESSIONS  1. Left ventricular ejection fraction, by estimation, is 25 to 30%. The  left ventricle has severely decreased function. The left ventricle  demonstrates global hypokinesis. Left ventricular diastolic function could  not be evaluated.  2. Right ventricular systolic function is  moderately reduced. The right  ventricular size is moderately enlarged. There is mildly elevated  pulmonary artery systolic pressure.  3. Left atrial size was severely dilated. No left atrial/left atrial  appendage thrombus was detected. The LAA emptying velocity was 10 cm/s.  4. Right atrial size was severely dilated.  5. The mitral valve is  normal in structure. Mild mitral valve  regurgitation. No evidence of mitral stenosis.  6. There is severe tricuspid leaflet malcoaptation. The tricuspid valve  is abnormal. Tricuspid valve regurgitation is severe.  7. Aortic vale area by planimetry is 1.45 cm sq.. The aortic valve is  tricuspid. There is moderate thickening of the aortic valve. Aortic valve  regurgitation is mild to moderate. Mild aortic valve stenosis.  8. There is severe spontaneous echo contrast in the aortic arch and  descending aorta, suggestive of low cardiac ouput.  9. Tiny bidirectional interatrial shunt is seen. There is a small patent  foramen ovale.   [place] 12/04/20:  Technically complicated procedure, predominantly because of inability to complete right heart cath.  Unsuccessful attempt at right heart cath from right brachial approach.  Right femoral approach was complicated by inability to advance into the pulmonary artery for pressure measurement and wedge recording.  The patient became confused and agitated causing [place] to abort the right heart phase of the procedure.  PA systolic pressure 41 mmHg based upon the documented right ventricular pressure.  Elevated right atrial mean pressure of 18 mmHg.  Normal right coronary, nondominant  Calcified ostial left main without obstruction.  Widely patent left anterior descending  Widely patent circumflex artery with TIMI grade II flow.  Because of reduced coronary flow, only 1 left coronary injection was performed.  No angiographic evidence of air embolism or thrombus.  Patient experienced no chest pain.  Altered mental status after 1 mg of IV Versed.  RECOMMENDATIONS:   Plan Per treating team.  TTE 12/03/20: IMPRESSIONS  1. Left ventricular ejection fraction, by estimation, is 25 to 30%. The  left ventricle has severely decreased function. The left ventricle  demonstrates global hypokinesis. The left ventricular internal cavity size  was moderately  dilated. Left  ventricular diastolic parameters are indeterminate.  2. Right ventricular systolic function is mildly reduced. The right  ventricular size is mildly enlarged. There is normal pulmonary artery  systolic pressure.  3. Left atrial size was mildly dilated.  4. Right atrial size was mildly dilated.  5. The mitral valve is degenerative. Mild mitral valve regurgitation. No  evidence of mitral stenosis. Moderate mitral annular calcification.  6. Tricuspid valve regurgitation is moderate. 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  7. The aortic valve is tricuspid. Aortic valve regurgitation is mild.  Mild to moderate aortic valve sclerosis/calcification is present, without  any evidence of aortic stenosis.  8. The inferior vena cava is dilated in size with <50% respiratory  variability, suggesting right atrial pressure of 15 mmHg.   Patient Profile     80 y.o. female with a history of hypertension, hyperlipidemia,pelvic abscess with sigmoid resectionandformation of colostomy,compression fracture with moderate stenosisand recent dx of PAF seen 12/2021not on anticoagulation secondary to recurrent falls who presented with Afib with RVR and acute on chronic systolic HF exacerbation for which Cardiology has been consulted.  Assessment & Plan    #Paroxysmal Atrial Fibrillation:  Patient presented with atrial fibrillationwith RVR. CHA2DS2-VASC =4 (HTN, age x2, female).  She was not previously anticoagulated due to fall risk.TTE in 12/2021showedLVEF of 55-60% with normal wall motion.  Had another admit in [place] with Afib with RVR in January, and was started on Eliquis 2.5 mg BID at that time.  During both admissions, converted spontaneously to NSR.  Now presenting with AF with RVR, and TTE shows new systolic dysfunction,  EF 25-30%. Now s/p successful DCCV on 12/05/20. -S/p successful TEE/DCCV on 12/05/20 -Continue amiodarone 200mg  BID x2 weeks and then 200mg  daily -Continue  metoprolol 75 mg BID--change to XL tomorrow -Continue Eliquis 2.5 mg BID  #Acute combined systolic and diastolic heart failure:  EF 25-30% on echo 2/9.  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  Cath on 2/10 did not show any CAD.  Limited RHC as unable to advance catheter into pulmonary artery, but RAP 18 -Continue lasix 40mg  IV BID--possible transition to PO tomorrow vs Monday -Change to metop succinate 75mg  XL daily tomorrow -Started entresto 24-26 mg BID  -Start Farxiga 5mg  daily  #Abdominal pain/Enterocolitis:  -Management per primary team    For questions or updates, please contact [facility] Please consult [email or web address] for contact info under        Signed, Freada Bergeron, MD  12/06/2020, 8:15 AM

## 2020-12-05 NOTE — Progress Notes (Signed)
PT Cancellation Note  Patient Details Name: Maria Travis MRN: [id number] DOB: April 23, 1941   Cancelled Treatment:    Reason Eval/Treat Not Completed: Patient at procedure or test/unavailable. Will check back later.   Shary Decamp [facility] 12/05/2020, 12:09 PM [place] Pager [phone] Office [phone]

## 2020-12-05 NOTE — Anesthesia Procedure Notes (Signed)
Procedure Name: MAC Date/Time: 12/05/2020 10:56 AM Performed by: Kathryne Hitch, CRNA Pre-anesthesia Checklist: Patient identified, Emergency Drugs available, Suction available and Patient being monitored Patient Re-evaluated:Patient Re-evaluated prior to induction Oxygen Delivery Method: Nasal cannula Preoxygenation: Pre-oxygenation with 100% oxygen Placement Confirmation: positive ETCO2 Dental Injury: Teeth and Oropharynx as per pre-operative assessment

## 2020-12-05 NOTE — Anesthesia Preprocedure Evaluation (Addendum)
Anesthesia Evaluation  Patient identified by MRN, date of birth, ID band Patient confused    Reviewed: Allergy & Precautions, NPO status , Patient's Chart, lab work & pertinent test results  Airway Mallampati: II  TM Distance: >3 FB Neck ROM: Full    Dental  (+) Dental Advisory Given, Teeth Intact   Pulmonary neg pulmonary ROS,    breath sounds clear to auscultation + decreased breath sounds      Cardiovascular hypertension, Pt. on home beta blockers and Pt. on medications + dysrhythmias Atrial Fibrillation  Rhythm:Irregular Rate:Normal  Echo 12/03/2020 1. Left ventricular ejection fraction, by estimation, is 25 to 30%. The left ventricle has severely decreased function. The left ventricle demonstrates global hypokinesis. The left ventricular internal cavity size was moderately dilated. Left ventricular diastolic parameters are indeterminate.  2. Right ventricular systolic function is mildly reduced. The right ventricular size is mildly enlarged. There is normal pulmonary artery systolic pressure.  3. Left atrial size was mildly dilated.  4. Right atrial size was mildly dilated.  5. The mitral valve is degenerative. Mild mitral valve regurgitation. No evidence of mitral stenosis. Moderate mitral annular calcification.  6. Tricuspid valve regurgitation is moderate. 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  7. The aortic valve is tricuspid. Aortic valve regurgitation is mild. Mild to moderate aortic valve sclerosis/calcification is present, without any evidence of aortic stenosis.  8. The inferior vena cava is dilated in size with <50% respiratory variability, suggesting right atrial pressure of 15 mmHg.    Neuro/Psych negative neurological ROS     GI/Hepatic negative GI ROS, Neg liver ROS,   Endo/Other  negative endocrine ROS  Renal/GU Renal disease     Musculoskeletal negative musculoskeletal ROS (+)   Abdominal    Peds  Hematology negative hematology ROS (+)   Anesthesia Other Findings   Reproductive/Obstetrics                            Anesthesia Physical Anesthesia Plan  ASA: IV  Anesthesia Plan: MAC   Post-op Pain Management:    Induction: Intravenous  PONV Risk Score and Plan: 2 and Propofol infusion, Treatment may vary due to age or medical condition and TIVA  Airway Management Planned: Natural Airway  Additional Equipment: None  Intra-op Plan:   Post-operative Plan:   Informed Consent: I have reviewed the patients History and Physical, chart, labs and discussed the procedure including the risks, benefits and alternatives for the proposed anesthesia with the patient or authorized representative who has indicated his/her understanding and acceptance.     Dental advisory given, History available from chart only and Consent reviewed with POA  Plan Discussed with: CRNA  Anesthesia Plan Comments:        Anesthesia Quick Evaluation

## 2020-12-05 NOTE — Progress Notes (Signed)
Subjective:  Patient seen s/p TEE and Cardioversion, sitting in chair. Patient reports increased sob, and throat discomfort. Also endorses some chest pain with coughing. Desires to transition to bed.   Daughter not at bedside. Notes patient has been having some intermittent wheezing the past few days. Reports patient was doing fine when at bedside earlier.   Objective:  Vital signs in last 24 hours: Vitals:   12/05/20 1130 12/05/20 1139 12/05/20 1200 12/05/20 1243  BP: 134/69 (!) 153/76 (!) 145/85 (!) 114/58  Pulse: 77 82 81 85  Resp: (!) 23 (!) 24 20 20   Temp:   97.6 F (36.4 C) 97.6 F (36.4 C)  TempSrc:   Oral Oral  SpO2: 95% 96% 97% 96%  Weight:      Height:       Weight change:   Intake/Output Summary (Last 24 hours) at 12/05/2020 1449 Last data filed at 12/05/2020 1114 Gross per 24 hour  Intake 643 ml  Output 1300 ml  Net -657 ml     Physical Exam Constitutional:      General: She is not in acute distress.    Appearance: She is not toxic-appearing or diaphoretic.     Comments: Patient seen sitting in chair; intermittently coughing. Moderately enhanced sob from prior exam. NAD. Speaks audibly  Cardiovascular:     Rate and Rhythm: Normal rate and regular rhythm.  Pulmonary:     Effort: No respiratory distress.     Breath sounds: Wheezing present.     Comments: Moderate SOB Abdominal:     General: There is distension.     Tenderness: There is abdominal tenderness. There is guarding.     Comments: Increased tenderness to abdominal palpation as compared to prior exam. Diffusely tender. No stool in colostomy bag.   Neurological:     Mental Status: She is alert.      Assessment/Plan:  Principal Problem:   Atrial fibrillation with RVR (HCC) Active Problems:   Syncope   Elevated troponin   Enterocolitis   Rilley Poulter is an 71 F with Hx of PAF (diagnosed in 09/2020 following near-syncope episodes; not on anticoagulation due to frequent falls),  hypertension, hyperlipidemia,s/p sigmoid colectomy with colostomy 7 years agoadmitted for Atrial Fibrillation with RVR, and Heart failure exacerbation after presenting at urgent care for abdominal pain. Abdominal pain is chronic, and has been worsening over the past month.Echo in 2021 showedLVEF of 55-60. 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 S/p TEE and Successful cardioversion on 2/11. Increased abdominal tenderness as compared to prior exams, and with notably wheezing at this time   Acute-onset Wheezing Increased SOB Patient with wheezing and increased sob when seen some hours following a TEE today. Daughter reports doctor in [place] mentioned potential hx of COPD. Patient speaking appropriately, though moderately increased work of breathing, cough, and reported throat discomfort. Esophageal perforation less likely, but possible in light of recent procedure. Potentially aspiration pneumonitis, or COPD exacerbation. Some background contribution of her heart failure exacerbation, for which diuresis continues as below.    -f/u chest x-ray -Started Duoneb  -Continued Monitoring    Abdominal Pain Enterocolitis Patient reporting long-standing hx of diffuse abdominal pain, that has worsened in the past month.No leukocytosis.Likely multifactorial with contribution of fluid overload state from acute on chronicHF,andpotential pelvic congestion syndrome per pelvic vascular prominence on CT.Evident fibroids may also be contributory. Patient does have ventral hernias with evidence ofsome bowel involvement,notablywithoutevidence ofstrangulation per CT abd pelvis  on 2/08.Patient with notably increased diffuse abdominal tenderness on exam today, with new-onset guarding. Assessment of hernias obstructed by patient guarding. Patient afebrile with normal WBC and appropriate BP; though intermittently  tachypnic.    -f/u repeat CT abdomen pelvis -f/u lactic acid -f/u LDH -f/u evening CBC -Consult General Surgery if progressive pain, or concern for ischemia per above    Atrial Fibrillation with RVR Patient presented in A fib on RVR per physician's request earlier today; with rate of 147 on arrival. Patient with history of newly diagnosed PAF in December 2021 following near-syncopal episodes;previouslynot anticoagulated due to hx of multiple falls.Later started on Eliquis in [place], with reported compliance. Cardiology following. S/p successful cardioversion today.   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 BNP significantly elevated at 768.8 on admission, with chest x-ray showing cardiomegaly stable from prior imaging, in addition to a new small right pleural effusion. 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 Likely exacerbated by A fib, and poor diet. Technically complicated Cath (unable to advance into pulmonary artery), however, showed elevated right atrial pressure; no evidence of CAD.   -Start Entresto 24-26 BID tomorrow (Switch from Losartan) -Continue IVLasix 40 BID per Cardiology -Increased Klor-con 20 mEq dailywith diuresis starting tomorrow -Strict Is and Os    Borderline dilation of intra and extra hepatic biliary tree and pancreatic duct Patient with hx of cholecystectomy and normal LFTs this admission are reassuring.MRCP  limited by patient motioning, but unremarkable for obstructing lesions or stones.   -Additional MRCP    Electrolyte abnormalities Repleted potassium    Hypertension Home losartan25   Adrenal Incidentaloma outpatientf/u   LOS: 3 days   Azell Der, Medical Student 12/05/2020, 2:49 PM

## 2020-12-06 DIAGNOSIS — R109 Unspecified abdominal pain: Secondary | ICD-10-CM

## 2020-12-06 DIAGNOSIS — I50813 Acute on chronic right heart failure: Secondary | ICD-10-CM

## 2020-12-06 LAB — CBC
HCT: 36.7 % (ref 36.0–46.0)
Hemoglobin: 11.4 g/dL — ABNORMAL LOW (ref 12.0–15.0)
MCH: 30.5 pg (ref 26.0–34.0)
MCHC: 31.1 g/dL (ref 30.0–36.0)
MCV: 98.1 fL (ref 80.0–100.0)
Platelets: 184 10*3/uL (ref 150–400)
RBC: 3.74 MIL/uL — ABNORMAL LOW (ref 3.87–5.11)
RDW: 15.1 % (ref 11.5–15.5)
WBC: 8.7 10*3/uL (ref 4.0–10.5)
nRBC: 0 % (ref 0.0–0.2)

## 2020-12-06 LAB — BASIC METABOLIC PANEL
Anion gap: 11 (ref 5–15)
BUN: 10 mg/dL (ref 8–23)
CO2: 25 mmol/L (ref 22–32)
Calcium: 9.5 mg/dL (ref 8.9–10.3)
Chloride: 105 mmol/L (ref 98–111)
Creatinine, Ser: 1.35 mg/dL — ABNORMAL HIGH (ref 0.44–1.00)
GFR, Estimated: 39 mL/min — ABNORMAL LOW (ref >60)
Glucose, Bld: 125 mg/dL — ABNORMAL HIGH (ref 70–99)
Potassium: 3.8 mmol/L (ref 3.5–5.1)
Sodium: 141 mmol/L (ref 135–145)

## 2020-12-06 LAB — MAGNESIUM: Magnesium: 1.8 mg/dL (ref 1.7–2.4)

## 2020-12-06 MED ORDER — METOPROLOL SUCCINATE ER 50 MG PO TB24
75.0000 mg | ORAL_TABLET | Freq: Every day | ORAL | Status: DC
  Administered 2020-12-07: 75 mg via ORAL
  Filled 2020-12-06: qty 1

## 2020-12-06 MED ORDER — METOPROLOL TARTRATE 50 MG PO TABS
75.0000 mg | ORAL_TABLET | Freq: Two times a day (BID) | ORAL | Status: AC
  Administered 2020-12-06: 75 mg via ORAL
  Filled 2020-12-06: qty 1

## 2020-12-06 MED ORDER — IPRATROPIUM-ALBUTEROL 0.5-2.5 (3) MG/3ML IN SOLN: 3.0000 mL | Freq: Four times a day (QID) | RESPIRATORY_TRACT | Status: DC | PRN

## 2020-12-06 MED ORDER — MAGNESIUM SULFATE 2 GM/50ML IV SOLN
2.0000 g | Freq: Once | INTRAVENOUS | Status: AC
  Administered 2020-12-06: 2 g via INTRAVENOUS
  Filled 2020-12-06: qty 50

## 2020-12-06 MED ORDER — METOPROLOL SUCCINATE ER 50 MG PO TB24: 75.0000 mg | ORAL_TABLET | Freq: Every day | ORAL | Status: DC

## 2020-12-06 MED ORDER — DAPAGLIFLOZIN PROPANEDIOL 5 MG PO TABS
5.0000 mg | ORAL_TABLET | Freq: Every day | ORAL | Status: DC
  Administered 2020-12-06 – 2020-12-08 (×3): 5 mg via ORAL
  Filled 2020-12-06 (×3): qty 1

## 2020-12-06 NOTE — Progress Notes (Addendum)
Subjective:  Patient reports doing well. Breathing improved from yesterday. Coughing less. Abdominal pain is improving, reports not feeling much pain at this time. Denies SOB.    Objective:  Vital signs in last 24 hours: Vitals:   12/05/20 2352 12/06/20 0353 12/06/20 0828 12/06/20 0856  BP:  129/87 139/86 139/86  Pulse:  89 81 82  Resp:  (!) 22 18   Temp: 98.4 F (36.9 C) 98.1 F (36.7 C) 98 F (36.7 C)   TempSrc: Axillary Oral Oral   SpO2:  97% 98%   Weight:  57.1 kg    Height:       Weight change:   Intake/Output Summary (Last 24 hours) at 12/06/2020 1057 Last data filed at 12/06/2020 0402 Gross per 24 hour  Intake 508 ml  Output 1300 ml  Net -792 ml   Physical Exam Constitutional:      General: She is not in acute distress.    Appearance: She is not ill-appearing, toxic-appearing or diaphoretic.     Comments: Patient seen lying in bed comfortably, NAD  Cardiovascular:     Rate and Rhythm: Normal rate and regular rhythm.  Pulmonary:     Effort: Pulmonary effort is normal. No respiratory distress.  Abdominal:     Palpations: Abdomen is soft.     Tenderness: There is abdominal tenderness.     Comments: Patient mildly tender diffusely. Colostomy bag in place with soft stool without visible blood  Neurological:     Mental Status: She is alert. Mental status is at baseline.      Assessment/Plan:  Principal Problem:   Atrial fibrillation with RVR (HCC) Active Problems:   Syncope   Elevated troponin   Enterocolitis   Maria Travis is an 66 F with Hx of PAF (diagnosed in 09/2020 following near-syncope episodes; not on anticoagulation due to frequent falls), hypertension, hyperlipidemia,s/p sigmoid colectomy with colostomy 7 years agoadmitted for Atrial Fibrillation with RVR, and Heart failure exacerbation after presenting at urgent care for abdominal pain. Abdominal pain is chronic, and has been worsening over the past month.Echo in 2021 showedLVEF of  55-60. 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 S/p TEE and Successful cardioversion on 2/11. Increased abdominal tenderness and onset of wheezing yesterday; now with improvement to pain and breathing.    Acute on Chronic HF. No CAD on 2/10 LHC Small PFO as noted on 2/11 TEE Chronic Hypertension -Start Entresto 24-26 BID (Switch from Losartan) -Start Farxiga 5mg  daily -ContinueIVLasix 40 BID per Cardiology    -possible transition to p.o. tomorrow vs Monday -Klor-con 20 mEq dailywith diuresis -Strict Is and Os, tele monitoring, daily labs  Atrial Fibrillation with RVR s/p DCCV, TEE 2/12 (resolved) CHADSVASC 5 =7.2% stroke risk/year -Cardiology following  -Continue Amiodarone 200 mg BID for 2 weeks then decrease dose to 200 mg daily -Continue Metoprolol 75 BID    -consolidate to XL tomorrow -Continue Eliquis 2.5 BID  Abdominal pain 2/2 gut edema vs ventral hernia. Pain improved today. Mildly dilated pancreatic and biliary ducts. MRCP images were degraded due to motion artifact  -will need a repeat MRCP in the future when patient is able to tolerate   Adrenal Incidentaloma outpatientf/u    LOS: 4 days   Azell Der, Medical Student 12/06/2020, 10:57 AM   Attestation for Student Documentation:  I personally was present and performed or re-performed the history, physical exam and medical decision-making activities of this service and have verified that the  service and findings are accurately documented in the student's note.   Mitzi Hansen, MD Internal Medicine Resident PGY-2 [facility] Pager: [phone] 12/06/2020 5:00 PM

## 2020-12-06 NOTE — Progress Notes (Signed)
Physical Therapy Treatment Patient Details Name: Maria Travis MRN: [id number] DOB: [birth year] Today's Date: 12/06/2020    History of Present Illness Pt adm with afib with rvr. Underwent cardiac cath 2/10 and cardioversion on 2/11. PMH - afib (new in 12/21, htn, colostomy, recurrent falls.    PT Comments    Pt progressing steadily towards her physical therapy goals. Ambulating 60 ft, then an additional 15 ft with a walker and one seated rest break. HR 84-93 bpm, SpO2 97-98% on RA. Demonstrates decreased cardiopulmonary endurance, weakness and balance deficits. D/c plan remains appropriate.     Follow Up Recommendations  Home health PT;Supervision for mobility/OOB     Equipment Recommendations  None recommended by PT    Recommendations for Other Services       Precautions / Restrictions Precautions Precautions: Fall;Other (comment) Precaution Comments: colostomy Restrictions Weight Bearing Restrictions: No    Mobility  Bed Mobility Overal bed mobility: Needs Assistance Bed Mobility: Supine to Sit     Supine to sit: Supervision          Transfers Overall transfer level: Needs assistance Equipment used: Rolling walker (2 wheeled) Transfers: Sit to/from Stand Sit to Stand: Min guard         General transfer comment: Min guard to rise from edge of bed and from toilet using grab bars  Ambulation/Gait Ambulation/Gait assistance: Min guard Gait Distance (Feet): 75 Feet (60, 15) Assistive device: Rolling walker (2 wheeled) Gait Pattern/deviations: Step-through pattern;Decreased stride length;Trunk flexed Gait velocity: decreased   General Gait Details: Pt ambulating 60 ft, then 15 ft with a walker and one seated rest break. Cues for sequencing/direction, min guard for safety   Stairs             Wheelchair Mobility    Modified Rankin (Stroke Patients Only)       Balance Overall balance assessment: Needs assistance Sitting-balance support: No  upper extremity supported;Feet supported Sitting balance-Leahy Scale: Good     Standing balance support: During functional activity;Single extremity supported Standing balance-Leahy Scale: Poor Standing balance comment: reliant on at least single UE support                            Cognition Arousal/Alertness: Awake/alert Behavior During Therapy: WFL for tasks assessed/performed Overall Cognitive Status: No family/caregiver present to determine baseline cognitive functioning Area of Impairment: Memory;Following commands;Safety/judgement;Problem solving                     Memory: Decreased short-term memory Following Commands: Follows one step commands consistently;Follows one step commands with increased time Safety/Judgement: Decreased awareness of deficits;Decreased awareness of safety   Problem Solving: Requires verbal cues;Requires tactile cues;Slow processing        Exercises      General Comments        Pertinent Vitals/Pain Pain Assessment: Faces Faces Pain Scale: No hurt    Home Living                      Prior Function            PT Goals (current goals can now be found in the care plan section) Acute Rehab PT Goals Patient Stated Goal: "get stronger." PT Goal Formulation: With patient Time For Goal Achievement: 12/19/20 Potential to Achieve Goals: Good Progress towards PT goals: Progressing toward goals    Frequency    Min 3X/week      PT Plan  Current plan remains appropriate    Co-evaluation              AM-PAC PT "6 Clicks" Mobility   Outcome Measure  Help needed turning from your back to your side while in a flat bed without using bedrails?: None Help needed moving from lying on your back to sitting on the side of a flat bed without using bedrails?: None Help needed moving to and from a bed to a chair (including a wheelchair)?: A Little Help needed standing up from a chair using your arms (e.g.,  wheelchair or bedside chair)?: A Little Help needed to walk in hospital room?: A Little Help needed climbing 3-5 steps with a railing? : A Little 6 Click Score: 20    End of Session Equipment Utilized During Treatment: Gait belt Activity Tolerance: Patient tolerated treatment well Patient left: in chair;with call bell/phone within reach;with chair alarm set Nurse Communication: Mobility status PT Visit Diagnosis: Unsteadiness on feet (R26.81);Muscle weakness (generalized) (M62.81);Repeated falls (R29.6)     Time: 4580-9983 PT Time Calculation (min) (ACUTE ONLY): 19 min  Charges:  $Therapeutic Activity: 8-22 mins                     Maria Travis, PT, DPT Acute Rehabilitation Services Pager [phone] Office [phone]    Maria Travis 12/06/2020, 2:26 PM

## 2020-12-07 ENCOUNTER — Encounter (HOSPITAL_COMMUNITY): Payer: Self-pay | Admitting: Cardiovascular Disease

## 2020-12-07 DIAGNOSIS — I5021 Acute systolic (congestive) heart failure: Secondary | ICD-10-CM

## 2020-12-07 LAB — BASIC METABOLIC PANEL
Anion gap: 11 (ref 5–15)
BUN: 15 mg/dL (ref 8–23)
CO2: 24 mmol/L (ref 22–32)
Calcium: 9.1 mg/dL (ref 8.9–10.3)
Chloride: 104 mmol/L (ref 98–111)
Creatinine, Ser: 1.2 mg/dL — ABNORMAL HIGH (ref 0.44–1.00)
GFR, Estimated: 45 mL/min — ABNORMAL LOW (ref >60)
Glucose, Bld: 117 mg/dL — ABNORMAL HIGH (ref 70–99)
Potassium: 3.8 mmol/L (ref 3.5–5.1)
Sodium: 139 mmol/L (ref 135–145)

## 2020-12-07 LAB — CBC
HCT: 36.7 % (ref 36.0–46.0)
Hemoglobin: 11.7 g/dL — ABNORMAL LOW (ref 12.0–15.0)
MCH: 30.5 pg (ref 26.0–34.0)
MCHC: 31.9 g/dL (ref 30.0–36.0)
MCV: 95.6 fL (ref 80.0–100.0)
Platelets: 186 10*3/uL (ref 150–400)
RBC: 3.84 MIL/uL — ABNORMAL LOW (ref 3.87–5.11)
RDW: 14.6 % (ref 11.5–15.5)
WBC: 8.8 10*3/uL (ref 4.0–10.5)
nRBC: 0 % (ref 0.0–0.2)

## 2020-12-07 MED ORDER — SPIRONOLACTONE 12.5 MG HALF TABLET
12.5000 mg | ORAL_TABLET | Freq: Every day | ORAL | Status: DC
  Administered 2020-12-07 – 2020-12-08 (×2): 12.5 mg via ORAL
  Filled 2020-12-07 (×2): qty 1

## 2020-12-07 MED ORDER — COLCHICINE 0.6 MG PO TABS
0.3000 mg | ORAL_TABLET | Freq: Every day | ORAL | Status: DC
  Administered 2020-12-08: 0.3 mg via ORAL
  Filled 2020-12-07: qty 0.5

## 2020-12-07 NOTE — TOC Initial Note (Signed)
Transition of Care [facility]) - Initial/Assessment Note    Patient Details  Name: Maria Travis MRN: [id number] Date of Birth: May 17, 1941  [facility] CM/SW Contact:    Bethena Roys, RN Phone Number: 12/07/2020, 11:20 AM  Clinical Narrative: Patient presented for atrial fib. Patient has a primary care provider- Dr. Delfina Redwood. Prior to arrival she was from home with the support of daughter. Per daughter patient has recently transitioned from [place] to [place]- has been here one week today. Patient was active with [facility] in [place] and daughter wants to continue the services. Case Manager made the referral to [facility] and start of care to begin within 24-48 hours post transition home. Patient has durable medical equipment (DME) rolling walker at home and needs a bedside commode for transition home. Case Manager ordered 3n1 via [employer] to be delivered to the room prior to transition home. Daughter states that she lives in a 2-story home and has prepared an area for her mother downstairs. Case Manager will continue to follow for additional transition of care needs.                    Expected Discharge Plan: [place] Barriers to Discharge: No Barriers Identified   Patient Goals and CMS Choice Patient states their goals for this hospitalization and ongoing recovery are:: to return home with daughter.   Choice offered to / list presented to : NA (Currently active with [facility] in [place]-)  Expected Discharge Plan and Services Expected Discharge Plan: [place] In-house Referral: NA Discharge Planning Services: CM Consult Post Acute Care Choice: Home Health,Resumption of Svcs/PTA Provider Living arrangements for the past 2 months: Single Family Home                 DME Arranged: Bedside commode DME Agency: [employer] Date DME Agency Contacted: 12/07/20 Time DME Agency Contacted: 38 Representative spoke with at DME Agency:  [place]: RN,Disease Management,PT,OT,Nurse's Aide [place] Agency: [place] Date [place]: 12/07/20 Time HH Agency Contacted: 35 Representative spoke with at [place]: Tommi Rumps  Prior Living Arrangements/Services Living arrangements for the past 2 months: [place] with:: Adult Children (lives with daughter) Patient language and need for interpreter reviewed:: Yes        Need for Family Participation in Patient Care: Yes (Comment) Care giver support system in place?: Yes (comment) Current home services: DME (Pt has a rolling walker) Criminal Activity/Legal Involvement Pertinent to Current Situation/Hospitalization: No - Comment as needed  Activities of Daily Living Home Assistive Devices/Equipment: Eyeglasses,Cane (specify quad or straight),Walker (specify type) ADL Screening (condition at time of admission) Patient's cognitive ability adequate to safely complete daily activities?: Yes Is the patient deaf or have difficulty hearing?: No Does the patient have difficulty seeing, even when wearing glasses/contacts?: No Does the patient have difficulty concentrating, remembering, or making decisions?: No Patient able to express need for assistance with ADLs?: Yes Does the patient have difficulty dressing or bathing?: No Independently performs ADLs?: Yes (appropriate for developmental age) Does the patient have difficulty walking or climbing stairs?: Yes Weakness of Legs: Both Weakness of Arms/Hands: None  Permission Sought/Granted Permission sought to share information with : Case Manager,Family Chief Financial Officer Permission granted to share information with : Yes, Verbal Permission Granted     Permission granted to share info w AGENCY: Alvis Lemm[facility], [employer]        Emotional Assessment Appearance:: Appears stated age Attitude/Demeanor/Rapport:  Engaged Affect (typically observed): Appropriate Orientation: : Oriented to  Situation,Oriented to Place,Oriented to Self,Oriented to  Time Alcohol / Substance Use: Not Applicable Psych Involvement: No (comment)  Admission diagnosis:  Enterocolitis [K52.9] Atrial fibrillation with RVR ([place]) [I48.91] Patient Active Problem List   Diagnosis Date Noted  . Enterocolitis   . Atrial fibrillation with RVR ([place]) 12/02/2020  . Elevated troponin 10/18/2020  . Hypertension 10/18/2020  . Hyperlipidemia 10/18/2020  . AKI (acute kidney injury) ([place]) 10/18/2020  . Paroxysmal atrial fibrillation ([place]) 10/17/2020  . Atrial fibrillation, rapid ([place])   . Hypokalemia   . Syncope    PCP:  Pcp, No Pharmacy:   [facility] [place] [address] Phone: [phone] Fax: [phone]  [facility] DR[facility] STORE [place], [place] AT [address] [address] Phone: [phone] Fax: [phone]  [facility] DRUG STORE [place] - [address] AT [address] [place] [place] [address] Phone: [phone] Fax: [phone]  [facility] [place], [place] - [place] [address] [place] [address] Phone: [phone] Fax: [phone]   Readmission Risk Interventions No flowsheet data found.

## 2020-12-07 NOTE — Progress Notes (Signed)
Progress Note  Patient Name: Maria Travis Date of Encounter: 12/07/2020  [facility] Cardiologist: No primary care provider on file.   Subjective   Patient states her abdominal pain is improving. She is wanting to go home soon.  Remains in sinus rhythm with HR 60-80s Cr down from 1.35-->1.20 I/Os and weight not recorded  Inpatient Medications    Scheduled Meds: . allopurinol  100 mg Oral Daily  . amiodarone  200 mg Oral BID  . apixaban  2.5 mg Oral BID  . atorvastatin  20 mg Oral QPM  . colchicine  0.6 mg Oral Daily  . dapagliflozin propanediol  5 mg Oral Daily  . folic acid  1 mg Oral q AM  . furosemide  40 mg Intravenous BID  . metoprolol succinate  75 mg Oral Daily  . pantoprazole  40 mg Oral QAC breakfast  . potassium chloride  20 mEq Oral q AM  . sacubitril-valsartan  1 tablet Oral BID  . sodium chloride flush  3 mL Intravenous Q12H  . sodium chloride flush  3 mL Intravenous Q12H  . thiamine  100 mg Oral Daily   Continuous Infusions: . sodium chloride 500 mL (12/05/20 1036)   PRN Meds: sodium chloride, acetaminophen, ipratropium-albuterol, ondansetron (ZOFRAN) IV, sodium chloride flush   Vital Signs    Vitals:   12/06/20 1331 12/06/20 2035 12/06/20 2228 12/07/20 0635  BP: 127/80  128/79 133/76  Pulse: 67  77 68  Resp: 20   (!) 23  Temp: 98.1 F (36.7 C) 98.2 F (36.8 C)    TempSrc: Oral Oral  Oral  SpO2: 98%   97%  Weight:      Height:        Intake/Output Summary (Last 24 hours) at 12/07/2020 0811 Last data filed at 12/06/2020 1853 Gross per 24 hour  Intake 261.38 ml  Output --  Net 261.38 ml   Last 3 Weights 12/06/2020 12/04/2020 12/02/2020  Weight (lbs) 125 lb 14.1 oz 121 lb 11.1 oz 122 lb  Weight (kg) 57.1 kg 55.2 kg 55.339 kg      Telemetry    NSR - Personally Reviewed  ECG    No new tracing - Personally Reviewed  Physical Exam   GEN: No acute distress.   Neck: mild JVD Cardiac: RRR, 2/6 systolic murmur. No rubs, or  gallops.  Respiratory: Clear to auscultation bilaterally. GI: Soft, mildly TTP in LLQ MS: No edema; No deformity. Neuro:  Nonfocal  Psych: Normal affect   Labs    High Sensitivity Troponin:  No results for input(s): TROPONINIHS in the last 720 hours.    Chemistry Recent Labs  Lab 12/02/20 1049 12/02/20 2206 12/05/20 0046 12/06/20 0922 12/07/20 0243  NA 143   < > 142 141 139  K 3.8   < > 3.4* 3.8 3.8  CL 111   < > 107 105 104  CO2 23   < > 25 25 24   GLUCOSE 115*   < > 100* 125* 117*  BUN 15   < > 9 10 15   CREATININE 1.17*   < > 1.12* 1.35* 1.20*  CALCIUM 9.5   < > 9.3 9.5 9.1  PROT 6.6  --   --   --   --   ALBUMIN 3.5  --   --   --   --   AST 21  --   --   --   --   ALT 20  --   --   --   --  ALKPHOS 56  --   --   --   --   BILITOT 0.8  --   --   --   --   GFRNONAA 47*   < > 49* 39* 45*  ANIONGAP 9   < > 10 11 11    < > = values in this interval not displayed.     Hematology Recent Labs  Lab 12/05/20 1755 12/06/20 0922 12/07/20 0243  WBC 9.1 8.7 8.8  RBC 3.84* 3.74* 3.84*  HGB 11.7* 11.4* 11.7*  HCT 37.1 36.7 36.7  MCV 96.6 98.1 95.6  MCH 30.5 30.5 30.5  MCHC 31.5 31.1 31.9  RDW 15.1 15.1 14.6  PLT 200 184 186    BNP Recent Labs  Lab 12/02/20 1051  BNP 768.8*     DDimer No results for input(s): DDIMER in the last 168 hours.   Radiology    CT ABDOMEN PELVIS W CONTRAST  Result Date: 12/05/2020 CLINICAL DATA:  Abdominal pain. EXAM: CT ABDOMEN AND PELVIS WITH CONTRAST TECHNIQUE: Multidetector CT imaging of the abdomen and pelvis was performed using the standard protocol following bolus administration of intravenous contrast. CONTRAST:  29mL OMNIPAQUE IOHEXOL 300 MG/ML  SOLN COMPARISON:  December 02, 2020 FINDINGS: Lower chest: Mild areas of atelectasis and/or infiltrate are seen within the bilateral lung bases. Small bilateral pleural effusions are noted. There is stable moderate severity cardiomegaly. Hepatobiliary: No focal liver abnormality is seen.  Status post cholecystectomy. Mild, stable central and extrahepatic biliary dilatation is noted. Pancreas: There is mild, stable pancreatic duct dilatation. The remainder of the pancreatic parenchyma is otherwise unremarkable. Spleen: Normal in size without focal abnormality. Adrenals/Urinary Tract: A stable mildly hyperdense 9 mm nodule is seen within the lateral limb of the right adrenal gland. The left adrenal gland is normal in appearance. Kidneys are normal in size, without renal calculi or hydronephrosis. Multiple stable simple cysts are seen within the right kidney. Bladder is unremarkable. Stomach/Bowel: Stomach is within normal limits. Appendix appears normal. No evidence of bowel wall thickening, distention, or inflammatory changes. Vascular/Lymphatic: Moderate severity aortic calcification. No enlarged abdominal or pelvic lymph nodes. Reproductive: Stable parenchymal calcifications are seen within the uterine fundus. The bilateral adnexa are unremarkable. Other: A 5.5 cm x 1.7 cm soft tissue defect is seen along the anterior aspect of the right groin. There is a left lower quadrant ostomy site. A stable, approximately 12.7 cm x 7.4 cm parastomal hernia is noted. A stable ventral defect is seen along the anterior pelvic wall. A small amount of fluid is seen within the mid to upper left abdomen. This is decreased in severity when compared to the prior study. Musculoskeletal: Multilevel degenerative changes are seen throughout the lumbar spine. IMPRESSION: 1. Mild bibasilar atelectasis and/or infiltrate with small bilateral pleural effusions. 2. Mildly hyperdense right adrenal nodule. Correlation with follow-up outpatient adrenal CT or MRI is recommended. 3. Stable ventral and left parastomal hernias. 4. Small amount of fluid within the mid to upper left abdomen, decreased in severity when compared to the prior study. 5. Aortic atherosclerosis. Aortic Atherosclerosis (ICD10-I70.0). Electronically Signed   By:  Virgina Norfolk M.D.   On: 12/05/2020 21:26   DG CHEST PORT 1 VIEW  Result Date: 12/05/2020 CLINICAL DATA:  Cough and tachycardia. EXAM: PORTABLE CHEST 1 VIEW COMPARISON:  12/02/2020 FINDINGS: The heart is enlarged but appears stable. There is tortuosity and mild calcification of the thoracic aorta. Mild vascular congestion and streaky basilar atelectasis but no definite infiltrates or edema. Possible small  effusions. The bony thorax is intact. IMPRESSION: Cardiac enlargement with vascular congestion and possible small effusions. Electronically Signed   By: Marijo Sanes M.D.   On: 12/05/2020 17:42   ECHO TEE  Result Date: 12/05/2020    TRANSESOPHOGEAL ECHO REPORT   Patient Name:   KALYNNE WOMAC Date of Exam: 12/05/2020 Medical Rec #:  [id number]        Height:       60.0 in Accession #:    [id number]       Weight:       121.7 lb Date of Birth:  07/08/1941        BSA:          1.511 m Patient Age:    80 years         BP:           141/97 mmHg Patient Gender: F                HR:           111 bpm. Exam Location:  Inpatient Procedure: Transesophageal Echo, Cardiac Doppler and Color Doppler Indications:     I48.91* Unspeicified atrial fibrillation  History:         Patient has prior history of Echocardiogram examinations, most                  recent 12/03/2020. Abnormal ECG, Arrythmias:Atrial Fibrillation,                  Signs/Symptoms:Syncope and Dyspnea; Risk Factors:Hypertension                  and Dyslipidemia.  Sonographer:     Roseanna Rainbow RDCS Referring Phys:  [place] Diagnosing Phys: Sanda Klein MD PROCEDURE: After discussion of the risks and benefits of a TEE, an informed consent was obtained from the patient. The transesophogeal probe was passed without difficulty through the esophogus of the patient. Imaged were obtained with the patient in a left lateral decubitus position. Sedation performed by different physician. The patient was monitored while under deep sedation. Anesthestetic  sedation was provided intravenously by Anesthesiology: 260mg  of Propofol. The patient developed no complications during the procedure. A successful direct current cardioversion was performed at 120 joules with 1 attempt. IMPRESSIONS  1. Left ventricular ejection fraction, by estimation, is 25 to 30%. The left ventricle has severely decreased function. The left ventricle demonstrates global hypokinesis. Left ventricular diastolic function could not be evaluated.  2. Right ventricular systolic function is moderately reduced. The right ventricular size is moderately enlarged. There is mildly elevated pulmonary artery systolic pressure.  3. Left atrial size was severely dilated. No left atrial/left atrial appendage thrombus was detected. The LAA emptying velocity was 10 cm/s.  4. Right atrial size was severely dilated.  5. The mitral valve is normal in structure. Mild mitral valve regurgitation. No evidence of mitral stenosis.  6. There is severe tricuspid leaflet malcoaptation. The tricuspid valve is abnormal. Tricuspid valve regurgitation is severe.  7. Aortic vale area by planimetry is 1.45 cm sq.. The aortic valve is tricuspid. There is moderate thickening of the aortic valve. Aortic valve regurgitation is mild to moderate. Mild aortic valve stenosis.  8. There is severe spontaneous echo contrast in the aortic arch and descending aorta, suggestive of low cardiac ouput.  9. Tiny bidirectional interatrial shunt is seen. There is a small patent foramen ovale. FINDINGS  Left Ventricle: Left ventricular ejection fraction, by estimation, is  25 to 30%. The left ventricle has severely decreased function. The left ventricle demonstrates global hypokinesis. The left ventricular internal cavity size was normal in size. There is no left ventricular hypertrophy. Left ventricular diastolic function could not be evaluated due to atrial fibrillation. Left ventricular diastolic function could not be evaluated. Right Ventricle: The  right ventricular size is moderately enlarged. No increase in right ventricular wall thickness. Right ventricular systolic function is moderately reduced. There is mildly elevated pulmonary artery systolic pressure. The tricuspid regurgitant velocity is 2.50 m/s, and with an assumed right atrial pressure of 15 mmHg, the estimated right ventricular systolic pressure is 51.8 mmHg. Left Atrium: Left atrial size was severely dilated. Spontaneous echo contrast was present in the left atrial appendage. No left atrial/left atrial appendage thrombus was detected. The LAA emptying velocity was 10 cm/s. Right Atrium: Right atrial size was severely dilated. Pericardium: There is no evidence of pericardial effusion. Mitral Valve: The mitral valve is normal in structure. Mild mitral valve regurgitation, with centrally-directed jet. No evidence of mitral valve stenosis. Tricuspid Valve: There is severe tricuspid leaflet malcoaptation. The tricuspid valve is abnormal. Tricuspid valve regurgitation is severe. No evidence of tricuspid stenosis. Aortic Valve: Aortic vale area by planimetry is 1.45 cm sq. The aortic valve is tricuspid. There is moderate thickening of the aortic valve. Aortic valve regurgitation is mild to moderate. Mild aortic stenosis is present. Pulmonic Valve: The pulmonic valve was normal in structure. Pulmonic valve regurgitation is not visualized. Aorta: There is severe spontaneous echo contrast in the aortic arch and descending aorta, suggestive of low cardiac ouput. The aortic root, ascending aorta, aortic arch and descending aorta are all structurally normal, with no evidence of dilitation or obstruction. There is minimal (Grade I) plaque. IAS/Shunts: Tiny bidirectional interatrial shunt is seen. A small patent foramen ovale is detected.  TRICUSPID VALVE TR Peak grad:   25.0 mmHg TR Vmax:        250.00 cm/s Sanda Klein MD Electronically signed by Sanda Klein MD Signature Date/Time: 12/05/2020/4:10:07 PM     Final     Cardiac Studies   TEE 12/05/20: IMPRESSIONS  1. Left ventricular ejection fraction, by estimation, is 25 to 30%. The  left ventricle has severely decreased function. The left ventricle  demonstrates global hypokinesis. Left ventricular diastolic function could  not be evaluated.  2. Right ventricular systolic function is moderately reduced. The right  ventricular size is moderately enlarged. There is mildly elevated  pulmonary artery systolic pressure.  3. Left atrial size was severely dilated. No left atrial/left atrial  appendage thrombus was detected. The LAA emptying velocity was 10 cm/s.  4. Right atrial size was severely dilated.  5. The mitral valve is normal in structure. Mild mitral valve  regurgitation. No evidence of mitral stenosis.  6. There is severe tricuspid leaflet malcoaptation. The tricuspid valve  is abnormal. Tricuspid valve regurgitation is severe.  7. Aortic vale area by planimetry is 1.45 cm sq.. The aortic valve is  tricuspid. There is moderate thickening of the aortic valve. Aortic valve  regurgitation is mild to moderate. Mild aortic valve stenosis.  8. There is severe spontaneous echo contrast in the aortic arch and  descending aorta, suggestive of low cardiac ouput.  9. Tiny bidirectional interatrial shunt is seen. There is a small patent  foramen ovale.   [place] 12/04/20:  Technically complicated procedure, predominantly because of inability to complete right heart cath. Unsuccessful attempt at right heart cath from right brachial approach. Right femoral  approach was complicated by inability to advance into the pulmonary artery for pressure measurement and wedge recording. The patient became confused and agitated causing [place] to abort the right heart phase of the procedure.  PA systolic pressure 41 mmHg based upon the documented right ventricular pressure. Elevated right atrial mean pressure of 18 mmHg.  Normal right coronary,  nondominant  Calcified ostial left main without obstruction.  Widely patent left anterior descending  Widely patent circumflex artery with TIMI grade II flow.  Because of reduced coronary flow, only 1 left coronary injection was performed. No angiographic evidence of air embolism or thrombus. Patient experienced no chest pain.  Altered mental status after 1 mg of IV Versed.  RECOMMENDATIONS:   Plan Per treating team.  TTE 12/03/20: IMPRESSIONS  1. Left ventricular ejection fraction, by estimation, is 25 to 30%. The  left ventricle has severely decreased function. The left ventricle  demonstrates global hypokinesis. The left ventricular internal cavity size  was moderately dilated. Left  ventricular diastolic parameters are indeterminate.  2. Right ventricular systolic function is mildly reduced. The right  ventricular size is mildly enlarged. There is normal pulmonary artery  systolic pressure.  3. Left atrial size was mildly dilated.  4. Right atrial size was mildly dilated.  5. The mitral valve is degenerative. Mild mitral valve regurgitation. No  evidence of mitral stenosis. Moderate mitral annular calcification.  6. Tricuspid valve regurgitation is moderate. 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  7. The aortic valve is tricuspid. Aortic valve regurgitation is mild.  Mild to moderate aortic valve sclerosis/calcification is present, without  any evidence of aortic stenosis.  8. The inferior vena cava is dilated in size with <50% respiratory  variability, suggesting right atrial pressure of 15 mmHg.  Patient Profile     80 y.o. female with a history of hypertension, hyperlipidemia,pelvic abscess with sigmoid resectionandformation of colostomy,compression fracture with moderate stenosisand recent dx of PAF seen 12/2021not on anticoagulation secondary to recurrent falls who presented with Afib with RVR and acute on chronic systolic HF exacerbation for which  Cardiology has been consulted.  Assessment & Plan    #Paroxysmal Atrial Fibrillation:  Patient presented with atrial fibrillationwith RVR. CHA2DS2-VASC =4 (HTN, age x2, female). She was not previously anticoagulated due to fall risk.TTE in 12/2021showedLVEF of 55-60% with normal wall motion. Had another admit in [place] with Afib with RVR in January, and was started on Eliquis 2.5 mg BID at that time. During both admissions, converted spontaneously to NSR. Now presenting with AF with RVR, and TTE shows new systolic dysfunction, EF 94-50%. Now s/p successful DCCV on 12/05/20. -S/p successful TEE/DCCV on 12/05/20 -Continue amiodarone 200mg  BID x2 weeks and then 200mg  daily -Change metop to 75mg  XL today -ContinueEliquis 2.5 mg BID  #Acute combined systolic and diastolic heart failure:  EF 25-30% on echo 2/9. 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  Cath on 2/10 did not show any CAD. Limited RHC as unable to advance catheter into pulmonary artery, but RAP 18 -Continue lasix 40mg  IV BID--possible transition to PO tomorrow  -Change to metop succinate 75mg  XL daily  -Continue entresto 24-26 mg BID--tolerating well  -Continue Farxiga 5mg  daily -Start spironolactone 12.5mg  daily  #Abdominal pain/Enterocolitis:  -Management per primary team      For questions or updates, please contact [facility] Please consult [email or web address] for contact info under        Signed, Freada Bergeron, MD  12/07/2020, 8:11 AM

## 2020-12-07 NOTE — Evaluation (Signed)
Occupational Therapy Evaluation Patient Details Name: Maria Travis MRN: [id number] DOB: [birth year] Today's Date: 12/07/2020    History of Present Illness Pt adm with afib with rvr. Underwent cardiac cath 2/10 and cardioversion on 2/11. PMH - afib (new in 12/21, htn, colostomy, recurrent falls.   Clinical Impression   Pt has been living at her daughter's house since Saturday and plans to return home with pt's daughter. Daughter reports pt has been independent with ADL and modified independent functional mobility at RW level.  Pt currently requires minguard for toileting, LB dressing and grooming. Pt demonstrates cognitive limitations, decreased activity tolerance, and generalized weakness impacting safety with ADL/IADL and functional mobility. Due to decline in current level of function, pt would benefit from acute OT to address established goals to facilitate safe D/C to venue listed below. At this time, recommend HHOT follow-up. Will continue to follow acutely.     Follow Up Recommendations  Home health OT;Supervision/Assistance - 24 hour    Equipment Recommendations  3 in 1 bedside commode    Recommendations for Other Services       Precautions / Restrictions Precautions Precautions: Fall;Other (comment) Precaution Comments: colostomy Restrictions Weight Bearing Restrictions: No      Mobility Bed Mobility               General bed mobility comments: pt sitting EOB upon arrival    Transfers Overall transfer level: Needs assistance Equipment used: Rolling walker (2 wheeled) Transfers: Sit to/from Stand Sit to Stand: Min guard         General transfer comment: minguard for safety with mobility and cues for safe hand placement    Balance Overall balance assessment: Needs assistance Sitting-balance support: No upper extremity supported;Feet supported Sitting balance-Leahy Scale: Good     Standing balance support: During functional activity;Single extremity  supported Standing balance-Leahy Scale: Poor Standing balance comment: reliant on at least single UE support                           ADL either performed or assessed with clinical judgement   ADL Overall ADL's : Needs assistance/impaired Eating/Feeding: Set up;Sitting   Grooming: Min guard;Standing   Upper Body Bathing: Set up;Sitting   Lower Body Bathing: Min guard;Sit to/from stand   Upper Body Dressing : Set up;Sitting   Lower Body Dressing: Minimal assistance;Sit to/from stand Lower Body Dressing Details (indicate cue type and reason): assist to access feet Toilet Transfer: Min guard;Ambulation;RW Toilet Transfer Details (indicate cue type and reason): short distance to [place] Manipulation and Hygiene: Min guard;Sit to/from stand Toileting - Clothing Manipulation Details (indicate cue type and reason): pt urinated and emptied colostomy with minimal assistance     Functional mobility during ADLs: Min guard;Rolling walker General ADL Comments: decreased activity tolerance, HR 70s-80s with exertion, SpO2 >94% on RA     Vision         Perception     Praxis      Pertinent Vitals/Pain Pain Assessment: No/denies pain Faces Pain Scale: No hurt     Hand Dominance Right   Extremity/Trunk Assessment Upper Extremity Assessment Upper Extremity Assessment: Generalized weakness   Lower Extremity Assessment Lower Extremity Assessment: Generalized weakness   Cervical / Trunk Assessment Cervical / Trunk Assessment: Normal   Communication Communication Communication: No difficulties   Cognition Arousal/Alertness: Awake/alert Behavior During Therapy: WFL for tasks assessed/performed Overall Cognitive Status: Impaired/Different from baseline Area of Impairment: Memory;Following commands;Safety/judgement;Problem solving  Memory: Decreased short-term memory Following Commands: Follows one step commands  consistently;Follows one step commands with increased time Safety/Judgement: Decreased awareness of deficits;Decreased awareness of safety   Problem Solving: Requires verbal cues;Requires tactile cues;Slow processing General Comments: pt with difficulty with problem solving and short term memory, required cues for safety with mobiltiy;pt identified her daughter as her mother, required cues for awareness and correction   General Comments  pt's daughter present and reports that she will be available to pt at d/c;daughter stepped out of room during session    Exercises     Shoulder Instructions      Home Living Family/patient expects to be discharged to:: Private residence Living Arrangements: Children (Pt lives in [place] but is staying with daughter here.) Available Help at Discharge: Family Type of Home: House Home Access: Stairs to enter Technical brewer of Steps: 3-4 Entrance Stairs-Rails: Right Home Layout: One level               [place]: [address] [place] - single point;Walker - 2 wheels          Prior Functioning/Environment Level of Independence: Needs assistance  Gait / Transfers Assistance Needed: amb with cane or walker. Recurrent falls              OT Problem List: Decreased activity tolerance;Impaired balance (sitting and/or standing);Decreased cognition;Decreased safety awareness;Decreased knowledge of use of DME or AE;Decreased knowledge of precautions;Cardiopulmonary status limiting activity      OT Treatment/Interventions: Self-care/ADL training;Therapeutic exercise;Energy conservation;DME and/or AE instruction;Therapeutic activities;Patient/family education;Balance training;Cognitive remediation/compensation    OT Goals(Current goals can be found in the care plan section) Acute Rehab OT Goals Patient Stated Goal: "get stronger." OT Goal Formulation: With patient Time For Goal Achievement: 12/21/20 Potential to Achieve Goals: Good ADL Goals Pt  Will Perform Grooming: (P) standing;with supervision Pt Will Perform Lower Body Dressing: (P) with supervision;sit to/from stand Pt Will Transfer to Toilet: (P) with supervision;ambulating Additional ADL Goal #1: (P) Pt's family will demonstrate safe and appropriate assistance with pt during ADL and functional mobility.  OT Frequency: Min 2X/week   Barriers to D/C:            Co-evaluation              AM-PAC OT "6 Clicks" Daily Activity     Outcome Measure Help from another person eating meals?: A Little Help from another person taking care of personal grooming?: A Little Help from another person toileting, which includes using toliet, bedpan, or urinal?: A Little Help from another person bathing (including washing, rinsing, drying)?: A Little Help from another person to put on and taking off regular upper body clothing?: A Little Help from another person to put on and taking off regular lower body clothing?: A Little 6 Click Score: 18   End of Session Equipment Utilized During Treatment: Rolling walker Nurse Communication: Mobility status  Activity Tolerance: Patient tolerated treatment well Patient left: in chair;with call bell/phone within reach;with chair alarm set;with family/visitor present  OT Visit Diagnosis: Other abnormalities of gait and mobility (R26.89);Repeated falls (R29.6);Muscle weakness (generalized) (M62.81);History of falling (Z91.81);Other symptoms and signs involving cognitive function                Time: 1033-1056 OT Time Calculation (min): 23 min Charges:  OT General Charges $OT Visit: 1 Visit OT Evaluation $OT Eval Moderate Complexity: 1 Mod OT Treatments $Self Care/Home Management : 8-22 mins  Helene Kelp OTR/L Acute Rehabilitation Services Office: [place] 12/07/2020,  12:43 PM

## 2020-12-07 NOTE — Progress Notes (Signed)
   Subjective:  No significant overnight events. Reports improvement in shortness of breath and abdominal pain.   Objective:  Vital signs in last 24 hours: Vitals:   12/06/20 0856 12/06/20 1331 12/06/20 2035 12/06/20 2228  BP: 139/86 127/80  128/79  Pulse: 82 67  77  Resp:  20    Temp:  98.1 F (36.7 C) 98.2 F (36.8 C)   TempSrc:  Oral Oral   SpO2:  98%    Weight:      Height:       Weight change:   Intake/Output Summary (Last 24 hours) at 12/07/2020 0557 Last data filed at 12/06/2020 1853 Gross per 24 hour  Intake 261.38 ml  Output --  Net 261.38 ml   Exam General: chronically ill appearing female in NAD Cardiac: RRR, no LE edema, JVD present but improved from prior Pulm: breathing comfortably on room air. Lung sounds diminished throughout GI: no distension. Pain with palpation to the lower quadrants.   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 Abdominal pain is chronic, and has been worsening over the past month.Echo in 2021 showedLVEF of 55-60. 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 S/p TEE and Successful cardioversion on 2/11. Increased abdominal tenderness and onset of wheezing yesterday; now with improvement to pain and breathing.    Acute on Chronic HF. EF 20-30%. 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 No coronary disease on 2/10 heart cath.  Atrial Fibrillation with RVR s/p successful DCCV  2/12 (resolved) CHADSVASC 5 =7.2% stroke risk/year Small PFO as noted on 2/11 TEE Chronic Hypertension Plan: management per cardiology -ContinueIVLasix 40 BID per Cardiology -Continue GDMT with entresto and spironolactone -continue Farxiga 5mg  daily -Klor-con 20 mEq dailywith diuresis -continue rhythm control with Amiodarone 200 mg BID for 2 weeks then decrease dose to 200 mg daily -continue rate control with metoprolol xl 75mg  daily -AC: eliquis -Strict Is and Os, tele monitoring, daily labs  Abdominal pain 2/2 gut edema vs ventral hernia. No significant changes Mildly dilated pancreatic and biliary ducts. MRCP images were degraded due to motion artifact  -will need a repeat MRCP in the future when patient is able to tolerate   Physical deconditioning: continue PT while here. HH PT, OT, RN, aide ordered for discharge.   Code: full Diet: HH VTE prophylaxis: eliquis Dispo: requires inpatient care for ongoing diuresis via IV and frequent lab monitoring   Mitzi Hansen, MD Internal Medicine Resident PGY-2 [facility] Pager: [phone] 12/07/2020 5:57 AM

## 2020-12-07 NOTE — Discharge Instructions (Signed)
Atrial Fibrillation  Atrial fibrillation is a type of heartbeat that is irregular or fast. If you have this condition, your heart beats without any order. This makes it hard for your heart to pump blood in a normal way. Atrial fibrillation may come and go, or it may become a long-lasting problem. If this condition is not treated, it can put you at higher risk for stroke, heart failure, and other heart problems. What are the causes? This condition may be caused by diseases that damage the heart. They include:  High blood pressure.  Heart failure.  Heart valve disease.  Heart surgery. Other causes include:  Diabetes.  Thyroid disease.  Being overweight.  Kidney disease. Sometimes the cause is not known. What increases the risk? You are more likely to develop this condition if:  You are older.  You smoke.  You exercise often and very hard.  You have a family history of this condition.  You are a man.  You use drugs.  You drink a lot of alcohol.  You have lung conditions, such as emphysema, pneumonia, or COPD.  You have sleep apnea. What are the signs or symptoms? Common symptoms of this condition include:  A feeling that your heart is beating very fast.  Chest pain or discomfort.  Feeling short of breath.  Suddenly feeling light-headed or weak.  Getting tired easily during activity.  Fainting.  Sweating. In some cases, there are no symptoms. How is this treated? Treatment for this condition depends on underlying conditions and how you feel when you have atrial fibrillation. They include:  Medicines to: ? Prevent blood clots. ? Treat heart rate or heart rhythm problems.  Using devices, such as a pacemaker, to correct heart rhythm problems.  Doing surgery to remove the part of the heart that sends bad signals.  Closing an area where clots can form in the heart (left atrial appendage). In some cases, your doctor will treat other underlying  conditions. Follow these instructions at home: Medicines  Take over-the-counter and prescription medicines only as told by your doctor.  Do not take any new medicines without first talking to your doctor.  If you are taking blood thinners: ? Talk with your doctor before you take any medicines that have aspirin or NSAIDs, such as ibuprofen, in them. ? Take your medicine exactly as told by your doctor. Take it at the same time each day. ? Avoid activities that could hurt or bruise you. Follow instructions about how to prevent falls. ? Wear a bracelet that says you are taking blood thinners. Or, carry a card that lists what medicines you take. Lifestyle  Do not use any products that have nicotine or tobacco in them. These include cigarettes, e-cigarettes, and chewing tobacco. If you need help quitting, ask your doctor.  Eat heart-healthy foods. Talk with your doctor about the right eating plan for you.  Exercise regularly as told by your doctor.  Do not drink alcohol.  Lose weight if you are overweight.  Do not use drugs, including cannabis.      General instructions  If you have a condition that causes breathing to stop for a short period of time (apnea), treat it as told by your doctor.  Keep a healthy weight. Do not use diet pills unless your doctor says they are safe for you. Diet pills may make heart problems worse.  Keep all follow-up visits as told by your doctor. This is important. Contact a doctor if:  You notice a   a change in the speed, rhythm, or strength of your heartbeat.  You are taking a blood-thinning medicine and you get more bruising.  You get tired more easily when you move or exercise.  You have a sudden change in weight. Get help right away if:  You have pain in your chest or your belly (abdomen).  You have trouble breathing.  You have side effects of blood thinners, such as blood in your vomit, poop (stool), or pee (urine), or bleeding that cannot  stop.  You have any signs of a stroke. "BE FAST" is an easy way to remember the main warning signs: ? B - Balance. Signs are dizziness, sudden trouble walking, or loss of balance. ? E - Eyes. Signs are trouble seeing or a change in how you see. ? F - Face. Signs are sudden weakness or loss of feeling in the face, or the face or eyelid drooping on one side. ? A - Arms. Signs are weakness or loss of feeling in an arm. This happens suddenly and usually on one side of the body. ? S - Speech. Signs are sudden trouble speaking, slurred speech, or trouble understanding what people say. ? T - Time. Time to call emergency services. Write down what time symptoms started.  You have other signs of a stroke, such as: ? A sudden, very bad headache with no known cause. ? Feeling like you may vomit (nausea). ? Vomiting. ? A seizure. These symptoms may be an emergency. Do not wait to see if the symptoms will go away. Get medical help right away. Call your local emergency services (911 in the [place] Do not drive yourself to the hospital.   Summary  Atrial fibrillation is a type of heartbeat that is irregular or fast.  You are at higher risk of this condition if you smoke, are older, have diabetes, or are overweight.  Follow your doctor's instructions about medicines, diet, exercise, and follow-up visits.  Get help right away if you have signs or symptoms of a stroke.  Get help right away if you cannot catch your breath, or you have chest pain or discomfort. This information is not intended to replace advice given to you by your health care provider. Make sure you discuss any questions you have with your health care provider. Document Revised: 04/04/2019 Document Reviewed: 04/04/2019 Elsevier Patient Education  2021 [employer] Information on my medicine - ELIQUIS (apixaban)  This medication education was reviewed with me or my healthcare representative as part of my discharge preparation.   Why  was Eliquis prescribed for you? Eliquis was prescribed for you to reduce the risk of a blood clot forming that can cause a stroke if you have a medical condition called atrial fibrillation (a type of irregular heartbeat).  What do You need to know about Eliquis ? Take your Eliquis TWICE DAILY - one tablet in the morning and one tablet in the evening with or without food. If you have difficulty swallowing the tablet whole please discuss with your pharmacist how to take the medication safely.  Take Eliquis exactly as prescribed by your doctor and DO NOT stop taking Eliquis without talking to the doctor who prescribed the medication.  Stopping may increase your risk of developing a stroke.  Refill your prescription before you run out.  After discharge, you should have regular check-up appointments with your healthcare provider that is prescribing your Eliquis.  In the future your dose may need to be changed if your kidney   function or weight changes by a significant amount or as you get older.  What do you do if you miss a dose? If you miss a dose, take it as soon as you remember on the same day and resume taking twice daily.  Do not take more than one dose of ELIQUIS at the same time to make up a missed dose.  Important Safety Information A possible side effect of Eliquis is bleeding. You should call your healthcare provider right away if you experience any of the following: ? Bleeding from an injury or your nose that does not stop. ? Unusual colored urine (red or dark brown) or unusual colored stools (red or black). ? Unusual bruising for unknown reasons. ? A serious fall or if you hit your head (even if there is no bleeding).  Some medicines may interact with Eliquis and might increase your risk of bleeding or clotting while on Eliquis. To help avoid this, consult your healthcare provider or pharmacist prior to using any new prescription or non-prescription medications, including  herbals, vitamins, non-steroidal anti-inflammatory drugs (NSAIDs) and supplements.  This website has more information on Eliquis (apixaban): http://www.eliquis.com/eliquis/home  

## 2020-12-08 DIAGNOSIS — I5021 Acute systolic (congestive) heart failure: Secondary | ICD-10-CM

## 2020-12-08 LAB — BASIC METABOLIC PANEL
Anion gap: 11 (ref 5–15)
BUN: 14 mg/dL (ref 8–23)
CO2: 25 mmol/L (ref 22–32)
Calcium: 9.2 mg/dL (ref 8.9–10.3)
Chloride: 104 mmol/L (ref 98–111)
Creatinine, Ser: 1.16 mg/dL — ABNORMAL HIGH (ref 0.44–1.00)
GFR, Estimated: 47 mL/min — ABNORMAL LOW (ref >60)
Glucose, Bld: 107 mg/dL — ABNORMAL HIGH (ref 70–99)
Potassium: 3.9 mmol/L (ref 3.5–5.1)
Sodium: 140 mmol/L (ref 135–145)

## 2020-12-08 LAB — CBC
HCT: 38.9 % (ref 36.0–46.0)
Hemoglobin: 12.2 g/dL (ref 12.0–15.0)
MCH: 30.3 pg (ref 26.0–34.0)
MCHC: 31.4 g/dL (ref 30.0–36.0)
MCV: 96.8 fL (ref 80.0–100.0)
Platelets: 200 10*3/uL (ref 150–400)
RBC: 4.02 MIL/uL (ref 3.87–5.11)
RDW: 14.6 % (ref 11.5–15.5)
WBC: 8.6 10*3/uL (ref 4.0–10.5)
nRBC: 0 % (ref 0.0–0.2)

## 2020-12-08 MED ORDER — METOPROLOL SUCCINATE ER 100 MG PO TB24
100.0000 mg | ORAL_TABLET | Freq: Every day | ORAL | Status: DC
  Administered 2020-12-08: 100 mg via ORAL
  Filled 2020-12-08: qty 1

## 2020-12-08 MED ORDER — SPIRONOLACTONE 25 MG PO TABS: 12.5000 mg | ORAL_TABLET | Freq: Every day | ORAL | 1 refills | Status: DC

## 2020-12-08 MED ORDER — FUROSEMIDE 40 MG PO TABS: 40.0000 mg | ORAL_TABLET | Freq: Every day | ORAL | 2 refills | Status: DC

## 2020-12-08 MED ORDER — FUROSEMIDE 40 MG PO TABS
40.0000 mg | ORAL_TABLET | Freq: Every day | ORAL | Status: DC
  Administered 2020-12-08: 40 mg via ORAL
  Filled 2020-12-08: qty 1

## 2020-12-08 MED ORDER — SACUBITRIL-VALSARTAN 24-26 MG PO TABS: 1.0000 | ORAL_TABLET | Freq: Two times a day (BID) | ORAL | 1 refills | Status: DC

## 2020-12-08 MED ORDER — AMIODARONE HCL 200 MG PO TABS: 200.0000 mg | ORAL_TABLET | Freq: Every day | ORAL | 1 refills | Status: DC

## 2020-12-08 MED ORDER — METOPROLOL SUCCINATE ER 100 MG PO TB24: 100.0000 mg | ORAL_TABLET | Freq: Every day | ORAL | 1 refills | Status: DC

## 2020-12-08 MED ORDER — DAPAGLIFLOZIN PROPANEDIOL 5 MG PO TABS: 5.0000 mg | ORAL_TABLET | Freq: Every day | ORAL | 1 refills | Status: DC

## 2020-12-08 MED ORDER — POTASSIUM CHLORIDE ER 20 MEQ PO TBCR: 20.0000 meq | EXTENDED_RELEASE_TABLET | Freq: Every morning | ORAL | 0 refills | Status: DC

## 2020-12-08 NOTE — Progress Notes (Signed)
Cardiology Progress Note  Patient ID: Maria Travis MRN: [id number] DOB: 07-26-1941 Date of Encounter: 12/08/2020  Primary Cardiologist: Donato Heinz, MD  Subjective   Chief Complaint: None.  HPI: Appears to be euvolemic on exam.  Transition to p.o. Lasix today.  ROS:  All other ROS reviewed and negative. Pertinent positives noted in the HPI.     Inpatient Medications  Scheduled Meds: . allopurinol  100 mg Oral Daily  . amiodarone  200 mg Oral BID  . apixaban  2.5 mg Oral BID  . atorvastatin  20 mg Oral QPM  . colchicine  0.3 mg Oral Daily  . dapagliflozin propanediol  5 mg Oral Daily  . folic acid  1 mg Oral q AM  . furosemide  40 mg Oral Daily  . metoprolol succinate  75 mg Oral Daily  . pantoprazole  40 mg Oral QAC breakfast  . potassium chloride  20 mEq Oral q AM  . sacubitril-valsartan  1 tablet Oral BID  . sodium chloride flush  3 mL Intravenous Q12H  . spironolactone  12.5 mg Oral Daily  . thiamine  100 mg Oral Daily   Continuous Infusions: . sodium chloride 500 mL (12/05/20 1036)   PRN Meds: sodium chloride, acetaminophen, ipratropium-albuterol, ondansetron (ZOFRAN) IV, sodium chloride flush   Vital Signs   Vitals:   12/07/20 1605 12/07/20 1635 12/08/20 0521 12/08/20 0647  BP: 125/76 124/74 (!) 146/87   Pulse: 77 78 74   Resp: 18 19 19    Temp:  97.7 F (36.5 C) 98.3 F (36.8 C)   TempSrc:  Oral Oral   SpO2: 97% 97% 100%   Weight:    51.9 kg  Height:       No intake or output data in the 24 hours ending 12/08/20 0902 Last 3 Weights 12/08/2020 12/06/2020 12/04/2020  Weight (lbs) 114 lb 8 oz 125 lb 14.1 oz 121 lb 11.1 oz  Weight (kg) 51.937 kg 57.1 kg 55.2 kg      Telemetry  Overnight telemetry shows sinus rhythm heart rate in the 70s, which I personally reviewed.   ECG  The most recent ECG shows sinus rhythm heart rate 81, inferior infarct, anterior infarct, nonspecific ST-T changes, which I personally reviewed.   Physical Exam    Vitals:   12/07/20 1605 12/07/20 1635 12/08/20 0521 12/08/20 0647  BP: 125/76 124/74 (!) 146/87   Pulse: 77 78 74   Resp: 18 19 19    Temp:  97.7 F (36.5 C) 98.3 F (36.8 C)   TempSrc:  Oral Oral   SpO2: 97% 97% 100%   Weight:    51.9 kg  Height:       No intake or output data in the 24 hours ending 12/08/20 0902  Last 3 Weights 12/08/2020 12/06/2020 12/04/2020  Weight (lbs) 114 lb 8 oz 125 lb 14.1 oz 121 lb 11.1 oz  Weight (kg) 51.937 kg 57.1 kg 55.2 kg    Body mass index is 22.36 kg/m.  General: Well nourished, well developed, in no acute distress Head: Atraumatic, normal size  Eyes: PEERLA, EOMI  Neck: Supple, no JVD Endocrine: No thryomegaly Cardiac: Normal S1, S2; RRR; no murmurs, rubs, or gallops Lungs: Diminished breath sounds bilaterally Abd: Soft, nontender, no hepatomegaly  Ext: No edema, pulses 2+ Musculoskeletal: No deformities, BUE and BLE strength normal and equal Skin: Warm and dry, no rashes   Neuro: Alert and oriented to person, place, time, and situation, CNII-XII grossly intact, no focal deficits  Psych:  Normal mood and affect   Labs  High Sensitivity Troponin:  No results for input(s): TROPONINIHS in the last 720 hours.   Cardiac EnzymesNo results for input(s): TROPONINI in the last 168 hours. No results for input(s): TROPIPOC in the last 168 hours.  Chemistry Recent Labs  Lab 12/02/20 1049 12/02/20 2206 12/06/20 0922 12/07/20 0243 12/08/20 0157  NA 143   < > 141 139 140  K 3.8   < > 3.8 3.8 3.9  CL 111   < > 105 104 104  CO2 23   < > 25 24 25   GLUCOSE 115*   < > 125* 117* 107*  BUN 15   < > 10 15 14   CREATININE 1.17*   < > 1.35* 1.20* 1.16*  CALCIUM 9.5   < > 9.5 9.1 9.2  PROT 6.6  --   --   --   --   ALBUMIN 3.5  --   --   --   --   AST 21  --   --   --   --   ALT 20  --   --   --   --   ALKPHOS 56  --   --   --   --   BILITOT 0.8  --   --   --   --   GFRNONAA 47*   < > 39* 45* 47*  ANIONGAP 9   < > 11 11 11    < > = values in this  interval not displayed.    Hematology Recent Labs  Lab 12/06/20 0922 12/07/20 0243 12/08/20 0157  WBC 8.7 8.8 8.6  RBC 3.74* 3.84* 4.02  HGB 11.4* 11.7* 12.2  HCT 36.7 36.7 38.9  MCV 98.1 95.6 96.8  MCH 30.5 30.5 30.3  MCHC 31.1 31.9 31.4  RDW 15.1 14.6 14.6  PLT 184 186 200   BNP Recent Labs  Lab 12/02/20 1051  BNP 768.8*    DDimer No results for input(s): DDIMER in the last 168 hours.   Radiology  No results found.  Cardiac Studies  TTE 12/03/2020 1. Left ventricular ejection fraction, by estimation, is 25 to 30%. The  left ventricle has severely decreased function. The left ventricle  demonstrates global hypokinesis. The left ventricular internal cavity size  was moderately dilated. Left  ventricular diastolic parameters are indeterminate.  2. Right ventricular systolic function is mildly reduced. The right  ventricular size is mildly enlarged. There is normal pulmonary artery  systolic pressure.  3. Left atrial size was mildly dilated.  4. Right atrial size was mildly dilated.  5. The mitral valve is degenerative. Mild mitral valve regurgitation. No  evidence of mitral stenosis. Moderate mitral annular calcification.  6. Tricuspid valve regurgitation is moderate. 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  7. The aortic valve is tricuspid. Aortic valve regurgitation is mild.  Mild to moderate aortic valve sclerosis/calcification is present, without  any evidence of aortic stenosis.  8. The inferior vena cava is dilated in size with <50% respiratory  variability, suggesting right atrial pressure of 15 mmHg.   [place] 12/04/2020   Technically complicated procedure, predominantly because of inability to complete right heart cath.  Unsuccessful attempt at right heart cath from right brachial approach.  Right femoral approach was complicated by inability to advance into the pulmonary artery for pressure measurement and wedge recording.  The patient became confused  and agitated causing [place] to abort the right heart phase of the procedure.  PA systolic  pressure 41 mmHg based upon the documented right ventricular pressure.  Elevated right atrial mean pressure of 18 mmHg.  Normal right coronary, nondominant  Calcified ostial left main without obstruction.  Widely patent left anterior descending  Widely patent circumflex artery with TIMI grade II flow.  Because of reduced coronary flow, only 1 left coronary injection was performed.  No angiographic evidence of air embolism or thrombus.  Patient experienced no chest pain.  Altered mental status after 1 mg of IV Versed.    Patient Profile  Maria Travis is a 80 y.o. 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  Assessment & Plan   1.  Persistent atrial fibrillation -Status post TEE/cardioversion on 12/05/2020 -Maintaining sinus rhythm.  Continue amiodarone with plans for 200 mg twice daily for 2 weeks and then 200 mg daily moving forward.  To make it simple I would recommend just discharging her home on 200 mg daily. -Metoprolol succinate increased to 100 mg daily -Continue Eliquis 2.5 mg twice daily. (Age over 8, weight less than 60 kg)  2.  Acute systolic heart failure, EF 25-30% -Left heart cath normal.  Suspect tachycardia induced. -Euvolemic on exam.  Transition to 40 mg p.o. Lasix daily. -Metoprolol succinate increased 100 mg daily. -Continue Entresto 24-26 mg twice daily.  Titrate dose every 2 weeks for target goal 97-1 03 twice daily. -She is on Aldactone 12.5 mg daily. -Continue Farxiga.  [facility] will sign off.   Medication Recommendations: Medical management as above. Other recommendations (labs, testing, etc): None. Follow up as an outpatient: We will arrange follow-up with Dr.  Alda Lea in 2 to 4 weeks  For questions or updates, please contact [place] Please consult [email or web address] for contact info under   Time Spent with Patient: I have spent a total of 25 minutes with patient reviewing hospital notes, telemetry, EKGs, labs and examining the patient as well as establishing an assessment and plan that was discussed with the patient.  > 50% of time was spent in direct patient care.    Signed, Addison Naegeli. Audie Box, [place]  12/08/2020 9:02 AM

## 2020-12-08 NOTE — Progress Notes (Signed)
Physical Therapy Treatment Patient Details Name: Maria Travis MRN: [id number] DOB: 1941/06/16 Today's Date: 12/08/2020    History of Present Illness Pt adm with afib with rvr. Underwent cardiac cath 2/10 and cardioversion on 2/11. PMH - afib (new in 12/21, htn, colostomy, recurrent falls.    PT Comments    Pt progressing towards goals, however, remains limited secondary to fatigue. Requiring min guard A for mobility using RW. Pt able to perform colostomy care with no assist at end of session. Current recommendations appropriate. Will continue to follow acutely.     Follow Up Recommendations  Home health PT;Supervision for mobility/OOB     Equipment Recommendations  None recommended by PT    Recommendations for Other Services       Precautions / Restrictions Precautions Precautions: Fall;Other (comment) Precaution Comments: colostomy Restrictions Weight Bearing Restrictions: No    Mobility  Bed Mobility Overal bed mobility: Needs Assistance Bed Mobility: Sit to Supine       Sit to supine: Supervision   General bed mobility comments: Sitting EOB upon entry. supervision to return to supine at end of session.    Transfers Overall transfer level: Needs assistance Equipment used: Rolling walker (2 wheeled) Transfers: Sit to/from Stand Sit to Stand: Min guard         General transfer comment: Min guard for safety. Cues for hand placement.  Ambulation/Gait Ambulation/Gait assistance: Min guard Gait Distance (Feet): 70 Feet Assistive device: Rolling walker (2 wheeled) Gait Pattern/deviations: Step-through pattern;Decreased stride length;Trunk flexed Gait velocity: decreased   General Gait Details: Cues for sequencing using RW. Pt fatiguing easily which limited gait distance.   Stairs             Wheelchair Mobility    Modified Rankin (Stroke Patients Only)       Balance Overall balance assessment: Needs assistance Sitting-balance support: No  upper extremity supported;Feet supported Sitting balance-Leahy Scale: Good     Standing balance support: No upper extremity supported Standing balance-Leahy Scale: Fair Standing balance comment: Able to maintain static standing without UE support                            Cognition Arousal/Alertness: Awake/alert Behavior During Therapy: WFL for tasks assessed/performed Overall Cognitive Status: No family/caregiver present to determine baseline cognitive functioning                                        Exercises      General Comments General comments (skin integrity, edema, etc.): Pt able to sit on commode and perform colostomy care without assist.      Pertinent Vitals/Pain Pain Assessment: Faces Faces Pain Scale: Hurts a little bit Pain Location: L wrist at IV site Pain Descriptors / Indicators: Grimacing Pain Intervention(s): Monitored during session;Limited activity within patient's tolerance;Repositioned    Home Living                      Prior Function            PT Goals (current goals can now be found in the care plan section) Acute Rehab PT Goals Patient Stated Goal: "get stronger." PT Goal Formulation: With patient Time For Goal Achievement: 12/19/20 Potential to Achieve Goals: Good Progress towards PT goals: Progressing toward goals    Frequency    Min 3X/week  PT Plan Current plan remains appropriate    Co-evaluation              AM-PAC PT "6 Clicks" Mobility   Outcome Measure  Help needed turning from your back to your side while in a flat bed without using bedrails?: None Help needed moving from lying on your back to sitting on the side of a flat bed without using bedrails?: None Help needed moving to and from a bed to a chair (including a wheelchair)?: A Little Help needed standing up from a chair using your arms (e.g., wheelchair or bedside chair)?: A Little Help needed to walk in hospital  room?: A Little Help needed climbing 3-5 steps with a railing? : A Little 6 Click Score: 20    End of Session Equipment Utilized During Treatment: Gait belt Activity Tolerance: Patient tolerated treatment well Patient left: in bed;with call bell/phone within reach Nurse Communication: Mobility status;Other (comment) (Pt IV bleeding) PT Visit Diagnosis: Unsteadiness on feet (R26.81);Muscle weakness (generalized) (M62.81);Repeated falls (R29.6)     Time: 8119-1478 PT Time Calculation (min) (ACUTE ONLY): 43 min  Charges:  $Gait Training: 8-22 mins $Therapeutic Activity: 8-22 mins $Self Care/Home Management: 8-22                     Maria Travis, DPT  Acute Rehabilitation Services  Pager: [phone] Office: [phone]    Maria Travis 12/08/2020, 10:57 AM

## 2020-12-08 NOTE — Care Management Important Message (Signed)
Important Message  Patient Details  Name: Maria Travis MRN: [id number] Date of Birth: 1941/01/30   Medicare Important Message Given:  Yes     Shelda Altes 12/08/2020, 11:16 AM

## 2020-12-08 NOTE — Discharge Summary (Addendum)
Name: Maria Travis MRN: [id number] DOB: 1940/11/01 80 y.o. PCP: Pcp, No  Date of Admission: 12/02/2020 10:37 AM Date of Discharge: 12/08/20 Attending Physician: Velna Ochs, MD  Discharge Diagnosis: 1. Atrial Fibrillation with RVR 2. NICM HFrEF 3. Ventral Hernias 4. Dilated pancreatic duct 5. Incidental Right Adrenal Nodule 6. ?Propofol allergy  7. Physical deconditioning 8. Cognitive impairment  Discharge Medications: Allergies as of 12/08/2020   No Known Allergies      Medication List     STOP taking these medications    diltiazem 240 MG 24 hr capsule Commonly known as: CARDIZEM CD   losartan 25 MG tablet Commonly known as: COZAAR       TAKE these medications    allopurinol 100 MG tablet Commonly known as: ZYLOPRIM Take 100 mg by mouth daily.   amiodarone 200 MG tablet Commonly known as: PACERONE Take 1 tablet (200 mg total) by mouth daily.   apixaban 2.5 MG Tabs tablet Commonly known as: ELIQUIS Take 2.5 mg by mouth 2 (two) times daily.   atorvastatin 20 MG tablet Commonly known as: LIPITOR Take 20 mg by mouth every evening.   colchicine 0.6 MG tablet Take 0.6 mg by mouth daily.   dapagliflozin propanediol 5 MG Tabs tablet Commonly known as: FARXIGA Take 1 tablet (5 mg total) by mouth daily. Start taking on: December 09, 3472   folic acid 1 MG tablet Commonly known as: FOLVITE Take 1 mg by mouth in the morning.   furosemide 40 MG tablet Commonly known as: LASIX Take 1 tablet (40 mg total) by mouth daily. Start taking on: December 09, 2020 What changed:  medication strength how much to take when to take this   metoprolol succinate 100 MG 24 hr tablet Commonly known as: TOPROL-XL Take 1 tablet (100 mg total) by mouth daily. Take with or immediately following a meal. Start taking on: December 09, 2020 What changed:  medication strength how much to take   pantoprazole 40 MG tablet Commonly known as: PROTONIX Take 40 mg by  mouth daily before breakfast.   Potassium Chloride ER 20 MEQ Tbcr Take 20 mEq by mouth in the morning. Start taking on: December 09, 2020 What changed:  medication strength how much to take   sacubitril-valsartan 24-26 MG Commonly known as: ENTRESTO Take 1 tablet by mouth 2 (two) times daily.   spironolactone 25 MG tablet Commonly known as: ALDACTONE Take 0.5 tablets (12.5 mg total) by mouth daily. Start taking on: December 09, 2020   thiamine 100 MG tablet Commonly known as: Vitamin B-1 Take 100 mg by mouth daily.               Durable Medical Equipment  (From admission, onward)           Start     Ordered   12/07/20 1114  For home use only DME 3 n 1  Once        12/07/20 1114            Disposition and follow-up:   Maria Travis was discharged from [facility] in Stable condition.  At the hospital follow up visit please address:  HFrEF (EF 25-30%). 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 Discharge weight: 51.9kg No coronary disease on LHC Medication changes/ GDMT: DISCONTINUE losartan 25mg  daily, START Entresto 24-25mg  twice daily, START spironolactone 12.5mg  daily, INCREASE lasix 40mg  daily, START farxiga, continue atorvastatin 20mg  daily Follow up appointment: Dr. Reather Littler in Cardiology in 2-4 weeks.   Atrial Fibrillation (resolved s/p  DCCV 2/12) Discharge medications: START amiodarone 200mg  daily, continue eliquis 2.5mg  BID, INCREASE metoprolol succinate to 100mg  daily Follow up appointment with cardiology as shown above -monitor thyroid function while on amiodarone  Pancreatic ductal dilation. MRCP was, unfortunately, severely motion degraded by did show dilation to 47mm.  Ventral hernias Follow up recommendations -repeat MRCP if patient is able to tolerate the exam for improved image quality and further evaluation  Incidental Right Adrenal Nodule (50mm) Needs follow up adrenal CT or MRI   Physical deconditioning, cognitive  impairment Follow up recommendations: -Home health PT, OT, RN and aide ordered at discharge -PCP to continue to follow cognitive impairment  Possible propofol allergy - Delayed reaction of tachypnea, diffuse wheezing, and acute worsening abdominal pain a couple hours after TEE cardioversion and administration of propofol. She remained hemodynamically stable and symptoms resolved without intervention.    Labs / imaging needed at time of follow-up:   - BMP, Check K+ and consider decreasing KCl - Repeat MRCP for evaluation of pancreatic ductal dilation  - Right Adrenal CT vs. MRI for work up of incidental adrenal nodule  Pending labs/ test needing follow-up: none  Follow-up Appointments:  Follow-up Information     [facility] Follow up.   Specialty: Home Health Services Why: Registered Nurse, Physical Therapy, Occupational Therapy & Aide-office to call with visit times.  Contact information: [address] [place] [address] [phone]         [facility] Follow up.   Why: Bedside commode to be delivered to the room prior to transition home.  Contact information: [place] [address] [phone]         Donato Heinz, MD. Schedule an appointment as soon as possible for a visit in 1 week(s).   Specialties: Cardiology, Radiology Contact information: [address] [place] [address] [phone]                 Hospital Course:  6 yof with PAF, hypertension, hyperlipidemia, s/p sigmoid colectomy with colostomy who presented to the urgent care with abdominal pain. Sent to [facility] ED for atrial fibrillation with RVR.  Admission EKG showed a-fib with ventricular rates in the 140s. CT abdomen was obtained for work up of abdominal pain with multiple abnormal findings, most notable for evidence of anasarca with bilateral pleural effusions, periportal edema with hepatic steatosis, edematous  colonic wall mural thickening, and dilation of the intra and extra hepatic biliary tree s/p cholecystectomy and pancreatic ductal dilation. Also with pelvic vascular congestion and multiple fibroids. Incidental right adrenal nodule also noted (8 mm).    She was admitted to our service for acute decompensated HFrEF 2/2 to atrial fibrillation with RVR. She was diuresed with IV lasix and rates improved on an IV diltiazem drip. Echocardiogram revealed a decreased EF of 25-30% from 55-60% in 2021. Cardiology was consulted. She underwent LHC on 2/10 which showed widely patent coronaries. Unfortunately  RHC had to be aborted due to the patient became confused and agitated during the procedure. Cardiomyopathy with reduced EF was felt to be 2/2 to her tachyarrhithmia. She underwent successful TEE cardioversion on 2/11.   After cardioversion, she developed transient tachypnea with diffuse expiratory wheezing and acute worsening abdominal pain. Repeat CXR showed mild vascular congestion. She remained hemodynamically stable without hypoxia. Duonebs were ordered but does not look like this was given. Wheezing had resolved by the following day. Repeat CT abdomen showed resolution of her colonic  wall edema and other stable findings. Question possible delayed propofol allergy?   Given findings of pancreatic ductal dilation on CT abdomen, MRCP was obtained for further evaluation. Unfortunately study was severely limited due to motion artifact, but again noted pancreatic ductal dilation up to 6 mm and mild intra and extrabiliary duct dilation but no evidence of choledocholithiasis or mass. LFTs were with normal limits. Recommend outpatient repeat MRCP when able.   On discharge patient was euvolemic and transitioned to oral lasix. She remained in sinus rhythm after her cardioversion. Abdominal pain improved with diuresis and she was tolerating PO on the day of discharge.    Discharge Vitals:   BP 136/80   Pulse 81   Temp  98 F (36.7 C) (Oral)   Resp 14   Ht 5' (1.524 m)   Wt 51.9 kg   SpO2 100%   BMI 22.36 kg/m    Discharge subjective: Patient looked clinically better today. States her breathing has improved and her stomach pain is much better.  Discharge exam: Constitutional: Well-appearing elderly woman sitting side of bed eating breakfast.  No acute distress Cardiovascular: Regular rate and rhythm.  No LE edema. Abdomen: Stable ventral hernia.  Mild pain with palpation of the RLQ. Extremities: 2+ distal pulses.No edema.  Neuro: Alert and oriented x3.  No focal neuro deficits.  Pertinent Labs, Studies, and Procedures:  CBC Latest Ref Rng & Units 12/08/2020 12/07/2020 12/06/2020  WBC 4.0 - 10.5 K/uL 8.6 8.8 8.7  Hemoglobin 12.0 - 15.0 g/dL 12.2 11.7(L) 11.4(L)  Hematocrit 36.0 - 46.0 % 38.9 36.7 36.7  Platelets 150 - 400 K/uL 200 186 184   BMP Latest Ref Rng & Units 12/08/2020 12/07/2020 12/06/2020  Glucose 70 - 99 mg/dL 107(H) 117(H) 125(H)  BUN 8 - 23 mg/dL 14 15 10   Creatinine 0.44 - 1.00 mg/dL 1.16(H) 1.20(H) 1.35(H)  Sodium 135 - 145 mmol/L 140 139 141  Potassium 3.5 - 5.1 mmol/L 3.9 3.8 3.8  Chloride 98 - 111 mmol/L 104 104 105  CO2 22 - 32 mmol/L 25 24 25   Calcium 8.9 - 10.3 mg/dL 9.2 9.1 9.5   CT ABDOMEN PELVIS W CONTRAST Result Date: 12/02/2020 IMPRESSION: 1. Diffuse edematous mural thickening of the terminal ileum and colon portions of the transverse and splenic flexure which protrude into the ventral herniations described below. Findings could reflect a nonspecific enterocolitis, either infectious, inflammatory or vascular in etiology. 2. Complex, multilobulated fat and bowel containing ventral hernias including a left upper quadrant peristomal hernia in a ventral midline hernia. Without significant mechanical obstruction at this time. There is some stranding and fluid within the hernia sac though may be distributed from the more diffuse features of anasarca elsewhere. 3. Features of  anasarca/volume overload with bilateral effusions, moderate volume ascites, circumferential body wall edema. Cardiomegaly and biatrial enlargement may suggest some underlying CHF. Passive atelectatic changes adjacent the pleural effusions, underlying infectious airspace disease not fully excluded. 4. Diffuse hepatic hypoattenuation, suggestive of hepatic steatosis. Periportal edema is nonspecific and can be seen with hepatic congestion, volume overload or intrinsic liver disease. 5. Nonspecific borderline dilatation of the intra and extrahepatic biliary tree and pancreatic duct albeit with distal tapering. No visible calcified gallstone nor obstructing lesions. While the bile duct dilatation may be related to post cholecystectomy reservoir effect, pancreatic ductal dilatation is somewhat conspicuous. Findings should prompt assessment of LFTs and consider further evaluation with outpatient MRCP as clinically indicated. 6. Hyperdense, possibly enhancing nodule in the right adrenal gland, indeterminate. Consider  further evaluation with outpatient adrenal CT or MR. 7. Fibroid uterus. Prominence of the gonadal veins and parametrial vasculature, left greater than right. Findings are nonspecific, but can be seen with pelvic congestion syndrome in the appropriate clinical setting. 8. Aortic Atherosclerosis (ICD10-I70.0). Electronically Signed   By: Lovena Le M.D.   On: 12/02/2020 15:32   MR ABDOMEN MRCP W WO CONTAST Result Date: 12/04/2020 IMPRESSION: 1. Markedly motion degraded exam. Postcontrast imaging of solid organ anatomy or is nondiagnostic. 2. Mild intra and extrahepatic biliary duct distension with no evidence for choledocholithiasis. No gross obstructing mass lesion in the head of pancreas. 3. Diffuse dilatation of the main pancreatic duct up to 6 mm diameter. No obstructing mass lesion or obstructing stone evident although study is markedly motion degraded. Repeat imaging after resolution of acute symptoms  when patient is better able to cooperate with positioning and breath holding may prove helpful. 4. Small volume intraperitoneal free fluid with diffuse body wall edema. 5. Complex ventral and left paramidline parastomal hernias containing small bowel and colon. These were better assessed on recent CT scan. Electronically Signed   By: Misty Stanley M.D.   On: 12/04/2020 05:38   ECHO TEE Result Date: 2/11/202  IMPRESSIONS  1. Left ventricular ejection fraction, by estimation, is 25 to 30%. The left ventricle has severely decreased function. The left ventricle demonstrates global hypokinesis. Left ventricular diastolic function could not be evaluated.  2. Right ventricular systolic function is moderately reduced. The right ventricular size is moderately enlarged. There is mildly elevated pulmonary artery systolic pressure.  3. Left atrial size was severely dilated. No left atrial/left atrial appendage thrombus was detected. The LAA emptying velocity was 10 cm/s.  4. Right atrial size was severely dilated.  5. The mitral valve is normal in structure. Mild mitral valve regurgitation. No evidence of mitral stenosis.  6. There is severe tricuspid leaflet malcoaptation. The tricuspid valve is abnormal. Tricuspid valve regurgitation is severe.  7. Aortic vale area by planimetry is 1.45 cm sq.. The aortic valve is tricuspid. There is moderate thickening of the aortic valve. Aortic valve regurgitation is mild to moderate. Mild aortic valve stenosis.  8. There is severe spontaneous echo contrast in the aortic arch and descending aorta, suggestive of low cardiac ouput.  9. Tiny bidirectional interatrial shunt is seen. There is a small patent foramen ovale.  ECHOCARDIOGRAM LIMITED Result Date: 12/03/2020 IMPRESSIONS  1. Left ventricular ejection fraction, by estimation, is 25 to 30%. The left ventricle has severely decreased function. The left ventricle demonstrates global hypokinesis. The left ventricular internal cavity  size was moderately dilated. Left ventricular diastolic parameters are indeterminate.  2. Right ventricular systolic function is mildly reduced. The right ventricular size is mildly enlarged. There is normal pulmonary artery systolic pressure.  3. Left atrial size was mildly dilated.  4. Right atrial size was mildly dilated.  5. The mitral valve is degenerative. Mild mitral valve regurgitation. No evidence of mitral stenosis. Moderate mitral annular calcification.  6. Tricuspid valve regurgitation is moderate. 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  7. The aortic valve is tricuspid. Aortic valve regurgitation is mild. Mild to moderate aortic valve sclerosis/calcification is present, without any evidence of aortic stenosis.  8. The inferior vena cava is dilated in size with <50% respiratory variability, suggesting right atrial pressure of 15 mmHg.     Signed:  Lacinda Axon, MD 12/08/2020, 11:56 AM Pager: [phone] Internal Medicine Teaching Service

## 2020-12-11 NOTE — Progress Notes (Unsigned)
Cardiology Clinic Note   Patient Name: Maria Travis Date of Encounter: 12/12/2020  Primary Care Provider:  Pcp, No Primary Cardiologist:  Donato Heinz, MD  Patient Maria Travis and 80 year old female presents the clinic today for follow-up evaluation of her persistent atrial fibrillation and acute systolic CHF.  Past Medical History    Past Medical History:  Diagnosis Date  . Atrial fibrillation ([place])   . Diverticulitis   . High cholesterol   . Hypertension    Past Surgical History:  Procedure Laterality Date  . CARDIOVERSION N/A 12/05/2020   Procedure: CARDIOVERSION;  Surgeon: Sanda Klein, MD;  Location: [facility];  Service: Cardiovascular;  Laterality: N/A;  . CHOLECYSTECTOMY    . COLOSTOMY    . RIGHT/LEFT HEART CATH AND CORONARY ANGIOGRAPHY N/A 12/04/2020   Procedure: RIGHT/LEFT HEART CATH AND CORONARY ANGIOGRAPHY;  Surgeon: Belva Crome, MD;  Location: [facility];  Service: Cardiovascular;  Laterality: N/A;  . TEE WITHOUT CARDIOVERSION N/A 12/05/2020   Procedure: TRANSESOPHAGEAL ECHOCARDIOGRAM (TEE);  Surgeon: Sanda Klein, MD;  Location: [facility] ENDOSCOPY;  Service: Cardiovascular;  Laterality: N/A;    Allergies  No Known Allergies  History of Present Illness    Ms. Maria Travis has a PMH of hypertension, hyperlipidemia, pelvic abscess with sigmoid resection colostomy, and paroxysmal atrial fibrillation.  She was admitted to the hospital on 12/02/2020 with paroxysmal atrial fibrillation new-onset systolic CHF.  Echocardiogram 12/03/2020 showed an EF of 25-30%, left ventricular global hypokinesis, left ventricular interior cavity moderately dilated, intermediate diastolic parameters, moderate-severe tricuspid stenosis, aortic valve regurgitation mild.  Cardiac catheterization showed minimal nonobstructive CAD.  She became agitated during the catheterization and right heart cath was not able to be completed.  She underwent TEE  cardioversion 12/05/2020.  She maintained sinus rhythm, was continued on amiodarone (plan 200 mg twice daily for 2 weeks then 200 mg daily thereafter).  She was continued on metoprolol succinate 100 mg daily and Eliquis 2.5 mg twice daily due to age and weight less than 60 kg.  Her acute systolic heart failure was felt to be tachycardia induced.  She was euvolemic on discharge and transition to furosemide 40 mg daily, Entresto 2426, and spironolactone 12.5 mg daily.  Her procedure was continued.  She was discharged on 12/08/2020 in stable condition.  She presents the clinic today for follow-up evaluation states she feels fairly well.  Her main complaint is congestion in the morning.  She presents with her daughter.  Her weight has remained stable and she weighs 116.8 pounds today.  Her blood pressure is 120/66.  She has been tolerating her medications well without side effects.  She denies lower extremity swelling.  We reviewed the process of increasing Entresto.  We reviewed her angiography and echocardiogram.  I have asked her to use OTC plain Mucinex for her congestion, continue to follow a low-sodium diet, continue daily weights, return in 1 week for BMP, and follow-up in 1 month.  Today she denies chest pain, increased shortness of breath, lower extremity edema, fatigue, palpitations, melena, hematuria, hemoptysis, diaphoresis, weakness, presyncope, syncope, orthopnea, and PND.   Home Medications    Prior to Admission medications   Medication Sig Start Date End Date Taking? Authorizing Provider  allopurinol (ZYLOPRIM) 100 MG tablet Take 100 mg by mouth daily.    [provider]  amiodarone (PACERONE) 200 MG tablet Take 1 tablet (200 mg total) by mouth daily. 12/08/20   Lacinda Axon, MD  apixaban Arne [place]) 2.5  MG TABS tablet Take 2.5 mg by mouth 2 (two) times daily.    [provider]  atorvastatin (LIPITOR) 20 MG tablet Take 20 mg by mouth every evening.    [provider]  colchicine 0.6 MG tablet Take 0.6 mg by mouth daily.    [provider]  dapagliflozin propanediol (FARXIGA) 5 MG TABS tablet Take 1 tablet (5 mg total) by mouth daily. 12/09/20   Lacinda Axon, MD  folic acid (FOLVITE) 1 MG tablet Take 1 mg by mouth in the morning.    [provider]  furosemide (LASIX) 40 MG tablet Take 1 tablet (40 mg total) by mouth daily. 12/09/20   Lacinda Axon, MD  metoprolol succinate (TOPROL-XL) 100 MG 24 hr tablet Take 1 tablet (100 mg total) by mouth daily. Take with or immediately following a meal. 12/09/20   [place], Charisse March, MD  pantoprazole (PROTONIX) 40 MG tablet Take 40 mg by mouth daily before breakfast.    [provider]  potassium chloride 20 MEQ TBCR Take 20 mEq by mouth in the morning. 12/09/20   Lacinda Axon, MD  sacubitril-valsartan (ENTRESTO) 24-26 MG Take 1 tablet by mouth 2 (two) times daily. 12/08/20   Lacinda Axon, MD  spironolactone (ALDACTONE) 25 MG tablet Take 0.5 tablets (12.5 mg total) by mouth daily. 12/09/20   Lacinda Axon, MD  thiamine (VITAMIN B-1) 100 MG tablet Take 100 mg by mouth daily.    [provider]    Family History    History reviewed. No pertinent family history. has no family status information on file.   Social History    Social History   Socioeconomic History  . Marital status: Single    Spouse name: Not on file  . Number of children: Not on file  . Years of education: Not on file  . Highest education level: Not on file  Occupational History  . Occupation: retired  Tobacco Use  . Smoking status: Never Smoker  . Smokeless tobacco: Never Used  Vaping Use  . Vaping Use: Never used  Substance and Sexual Activity  . Alcohol use: Yes    Comment: weekends  . Drug use: No  . Sexual activity: Not on file  Other Topics Concern  . Not on file  Social History Narrative  . Not on file   Social Determinants of Health   Financial  Resource Strain: Not on file  Food Insecurity: Not on file  Transportation Needs: Not on file  Physical Activity: Not on file  Stress: Not on file  Social Connections: Not on file  Intimate Partner Violence: Not on file     Review of Systems    General:  No chills, fever, night sweats or weight changes.  Cardiovascular:  No chest pain, dyspnea on exertion, edema, orthopnea, palpitations, paroxysmal nocturnal dyspnea. Dermatological: No rash, lesions/masses Respiratory: No cough, dyspnea Urologic: No hematuria, dysuria Abdominal:   No nausea, vomiting, diarrhea, bright red blood per rectum, melena, or hematemesis Neurologic:  No visual changes, wkns, changes in mental status. All other systems reviewed and are otherwise negative except as noted above.  Physical Exam    VS:  BP 120/66   Pulse 76   Ht 5\' 1"  (1.549 m)   Wt 116 lb 12.8 oz (53 kg)   SpO2 98%   BMI 22.07 kg/m  , BMI Body mass index is 22.07 kg/m. GEN: Well nourished, well developed, in no acute distress. HEENT: normal. Neck: Supple,  no JVD, carotid bruits, or masses. Cardiac: RRR, no murmurs, rubs, or gallops. No clubbing, cyanosis, edema.  Radials/DP/PT 2+ and equal bilaterally.  Respiratory:  Respirations regular and unlabored, clear to auscultation bilaterally. GI: Soft, nontender, nondistended, BS + x 4. MS: no deformity or atrophy. Skin: warm and dry, no rash. Neuro:  Strength and sensation are intact. Psych: Normal affect.  Accessory Clinical Findings    Recent Labs: 10/18/2020: TSH 1.288 12/02/2020: ALT 20; B Natriuretic Peptide 768.8 12/06/2020: Magnesium 1.8 12/08/2020: BUN 14; Creatinine, Ser 1.16; Hemoglobin 12.2; Platelets 200; Potassium 3.9; Sodium 140   Recent Lipid Panel    Component Value Date/Time   CHOL 162 10/18/2020 0208   TRIG 148 10/18/2020 0208   HDL 53 10/18/2020 0208   CHOLHDL 3.1 10/18/2020 0208   VLDL 30 10/18/2020 0208   LDLCALC 79 10/18/2020 0208    ECG personally reviewed  by me today-normal sinus rhythm left axis deviation low voltage QRS and inferior infarct undetermined age cannot rule out anterior septal infarct undetermined age 62 bpm  TTE 12/03/2020 1. Left ventricular ejection fraction, by estimation, is 25 to 30%. The  left ventricle has severely decreased function. The left ventricle  demonstrates global hypokinesis. The left ventricular internal cavity size  was moderately dilated. Left  ventricular diastolic parameters are indeterminate.  2. Right ventricular systolic function is mildly reduced. The right  ventricular size is mildly enlarged. There is normal pulmonary artery  systolic pressure.  3. Left atrial size was mildly dilated.  4. Right atrial size was mildly dilated.  5. The mitral valve is degenerative. Mild mitral valve regurgitation. No  evidence of mitral stenosis. Moderate mitral annular calcification.  6. Tricuspid valve regurgitation is moderate. 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  7. The aortic valve is tricuspid. Aortic valve regurgitation is mild.  Mild to moderate aortic valve sclerosis/calcification is present, without  any evidence of aortic stenosis.  8. The inferior vena cava is dilated in size with <50% respiratory  variability, suggesting right atrial pressure of 15 mmHg.    Cardiac catheterization 12/04/2020   Technically complicated procedure, predominantly because of inability to complete right heart cath.  Unsuccessful attempt at right heart cath from right brachial approach.  Right femoral approach was complicated by inability to advance into the pulmonary artery for pressure measurement and wedge recording.  The patient became confused and agitated causing [place] to abort the right heart phase of the procedure.  PA systolic pressure 41 mmHg based upon the documented right ventricular pressure.  Elevated right atrial mean pressure of 18 mmHg.  Normal right coronary, nondominant  Calcified ostial left  main without obstruction.  Widely patent left anterior descending  Widely patent circumflex artery with TIMI grade II flow.  Because of reduced coronary flow, only 1 left coronary injection was performed.  No angiographic evidence of air embolism or thrombus.  Patient experienced no chest pain.  Altered mental status after 1 mg of IV Versed.  RECOMMENDATIONS:   Plan Per treating team.  Diagnostic Dominance: Left    Intervention    Assessment & Plan   1.  Persistent atrial fibrillation-EKG today shows sinus rhythm left axis deviation low voltage QRS inferior infarct undetermined age 65 bpm.  Underwent successful TEE cardioversion on 12/05/2020.  Started on amiodarone at that time.  Eliquis dose 2.5 due to age and weight less than 60 kg. Continue Eliquis, amiodarone, metoprolol Heart healthy low-sodium diet-salty 6 given Increase physical activity as tolerated Avoid triggers caffeine, chocolate, EtOH,  dehydration etc.  Acute systolic CHF-no increased DOE or activity intolerance today.  EF found to be 25-30% and felt to be related to tachycardia.  Continues to be euvolemic.  Reports compliance with furosemide. Continue furosemide, spironolactone  Increase Entresto to 49/51 heart healthy low-sodium diet-salty 6 given Increase physical activity as tolerated Repeat echocardiogram after medications been optimized for 1 month. Order BMP in 1 week.  AKI-creatinine increased to 1.35 and was 1.16 on discharge.  Baseline creatinine 0.96 Order BMP in 1 week  Disposition: Follow-up with Dr. Gardiner Rhyme or me in 1 month.  Jossie Ng. Makell Drohan NP-C    12/12/2020, 10:12 AM [place] [address] Office [phone] Fax [phone]  Notice: This dictation was prepared with Dragon dictation along with smaller phrase technology. Any transcriptional errors that result from this process are unintentional and may not be corrected upon review.  I  spent 15 minutes examining this patient, reviewing medications, and using patient centered shared decision making involving her cardiac care.  Prior to her visit I spent greater than 20 minutes reviewing her past medical history,  medications, and prior cardiac tests.

## 2020-12-12 ENCOUNTER — Encounter: Payer: Self-pay | Admitting: General Practice

## 2020-12-12 ENCOUNTER — Other Ambulatory Visit: Payer: Self-pay

## 2020-12-12 ENCOUNTER — Ambulatory Visit (INDEPENDENT_AMBULATORY_CARE_PROVIDER_SITE_OTHER): Payer: Medicare Other | Admitting: General Practice

## 2020-12-12 ENCOUNTER — Telehealth: Payer: Self-pay | Admitting: General Practice

## 2020-12-12 VITALS — BP 120/66 | HR 76 | Ht 61.0 in | Wt 116.8 lb

## 2020-12-12 DIAGNOSIS — I4819 Other persistent atrial fibrillation: Secondary | ICD-10-CM | POA: Diagnosis not present

## 2020-12-12 DIAGNOSIS — N179 Acute kidney failure, unspecified: Secondary | ICD-10-CM

## 2020-12-12 DIAGNOSIS — I5021 Acute systolic (congestive) heart failure: Secondary | ICD-10-CM

## 2020-12-12 MED ORDER — ENTRESTO 49-51 MG PO TABS: 1.0000 | ORAL_TABLET | Freq: Two times a day (BID) | ORAL | 12 refills | Status: DC

## 2020-12-12 NOTE — Patient Instructions (Signed)
Medication Instructions:  INCREASE ENTRESTO 60/51 MG DAILY  YOU MAY TAKE REGULAR MUCINEX. NOTHING WITH -D ON LABEL *If you need a refill on your cardiac medications before your next appointment, please call your pharmacy*  Lab Work: BMET IN 1 WEEK 12-19-20 If you have labs (blood work) drawn today and your tests are completely normal, you will receive your results only by:  [facility] (if you have MyChart) OR A paper copy in the mail.  If you have any lab test that is abnormal or we need to change your treatment, we will call you to review the results. You may go to any [facility] that is convenient for you however, we do have a lab in our office that is able to assist you. You DO NOT need an appointment for our lab. The lab is open 8:00am and closes at 4:00pm. Lunch 12:45 - 1:45pm.  Special Instructions TAKE AND LOG YOUR BLOOD PRESSURE AND WEIGHT DAILY  Follow-Up: Your next appointment:  1 month(s) In Person with Skeet Latch, MD OR IF UNAVAILABLE [place], FNP-C  At [facility], you and your health needs are our priority.  As part of our continuing mission to provide you with exceptional heart care, we have created designated Provider Care Teams.  These Care Teams include your primary Cardiologist (physician) and Advanced Practice Providers (APPs -  Physician Assistants and Nurse Practitioners) who all work together to provide you with the care you need, when you need it.  We recommend signing up for the patient portal called "MyChart".  Sign up information is provided on this After Visit Summary.  MyChart is used to connect with patients for Virtual Visits (Telemedicine).  Patients are able to view lab/test results, encounter notes, upcoming appointments, etc.  Non-urgent messages can be sent to your provider as well.   To learn more about what you can do with MyChart, go to [email or web address].

## 2020-12-12 NOTE — Addendum Note (Signed)
Addended by: Waylan Rocher on: 12/12/2020 10:38 AM   Modules accepted: Orders

## 2020-12-12 NOTE — Telephone Encounter (Signed)
°*  STAT* If patient is at the pharmacy, call can be transferred to refill team.   1. Which medications need to be refilled? (please list name of each medication and dose if known) sacubitril-valsartan (ENTRESTO) 49-51 MG  2. Which pharmacy/location (including street and city if local pharmacy) is medication to be sent to? [facility] [place], [place]  3. Do they need a 30 day or 90 day supply? 90 day supply   Prescription was sent to the wrong pharmacy.

## 2020-12-15 NOTE — Addendum Note (Signed)
Addended by: Waylan Rocher on: 12/15/2020 11:58 AM   Modules accepted: Orders

## 2020-12-22 ENCOUNTER — Other Ambulatory Visit: Payer: Self-pay

## 2020-12-22 MED ORDER — ENTRESTO 49-51 MG PO TABS: 1.0000 | ORAL_TABLET | Freq: Two times a day (BID) | ORAL | 3 refills | Status: DC

## 2020-12-23 LAB — BASIC METABOLIC PANEL
BUN/Creatinine Ratio: 31 — ABNORMAL HIGH (ref 12–28)
BUN: 46 mg/dL — ABNORMAL HIGH (ref 8–27)
CO2: 20 mmol/L (ref 20–29)
Calcium: 10.5 mg/dL — ABNORMAL HIGH (ref 8.7–10.3)
Chloride: 106 mmol/L (ref 96–106)
Creatinine, Ser: 1.47 mg/dL — ABNORMAL HIGH (ref 0.57–1.00)
Glucose: 107 mg/dL — ABNORMAL HIGH (ref 65–99)
Potassium: 4.5 mmol/L (ref 3.5–5.2)
Sodium: 143 mmol/L (ref 134–144)
eGFR: 36 mL/min/{1.73_m2} — ABNORMAL LOW (ref >59)

## 2020-12-24 ENCOUNTER — Ambulatory Visit
Admission: RE | Admit: 2020-12-24 | Discharge: 2020-12-24 | Disposition: A | Discharge status: Home / Self Care | Payer: Medicare Other | Source: Ambulatory Visit | Attending: Internal Medicine | Admitting: Internal Medicine

## 2020-12-24 ENCOUNTER — Other Ambulatory Visit: Payer: Self-pay | Admitting: Internal Medicine

## 2020-12-24 DIAGNOSIS — M25559 Pain in unspecified hip: Secondary | ICD-10-CM

## 2020-12-26 ENCOUNTER — Telehealth: Payer: Self-pay | Admitting: Cardiology

## 2020-12-26 MED ORDER — ENTRESTO 24-26 MG PO TABS: 1.0000 | ORAL_TABLET | Freq: Two times a day (BID) | ORAL | 0 refills | Status: DC

## 2020-12-26 NOTE — Telephone Encounter (Signed)
Pt c/o medication issue:  1. Name of Medication: sacubitril-valsartan (ENTRESTO) 49-51 MG  2. How are you currently taking this medication (dosage and times per day)? Needs clarification  3. Are you having a reaction (difficulty breathing--STAT)? no  4. What is your medication issue? Maria Travis, Warden/ranger, with [facility] calling to clarify how the patient is to take her entresto. He states it is okay to leave a vm.

## 2020-12-26 NOTE — Telephone Encounter (Signed)
Spoke with Maria Travis with occupational therapy who states that patient was having issues with Entresto Dosage and wanted to clarify. Maria Travis that per recent result note from [place], NP  12/23/2020 6:25 AM EST      Please contact [name] and let her know that her lab values have been reviewed. Her creatinine is increased from 1.16 up to 1.47. We will reduce her Entresto to the starting dose of 24/26 and repeat her BMP during her follow-up appointment. Please ask her to slightly increase her hydration. Thank you.   Maria Travis stated that patient is currently cutting medication in half and wants to make sure that is okay. Maria Travis that patient is okay to Cut tablet in half until patient receives new prescription of Medication. Don asked me to call patient to let her know. Maria Travis I would call patient to make her aware.   Spoke with patient's daughter and verified that patient is currently taking half a tablet of 49-51mg  Entresto. Per patients daughter, patient was told by pharmacy to half the tablet since patient recently picked up new prescription.   Chart updated to reflect medication change.

## 2020-12-31 ENCOUNTER — Telehealth: Payer: Self-pay | Admitting: Cardiology

## 2020-12-31 NOTE — Telephone Encounter (Signed)
Spoke with pt and rose, the patients entresto was decreased on 12-23-20 and she has been taking 1/2 of the 49/51 tablet twice daily. The patient had been feeling better on the decreased dose but for the last 3 days her bp has ranged 104-110/60-56 and the patient is having dizziness when standing from a sitting position. The patient reports she is drinking plenty of fluids but rose does not think she is. The patient has no other complaints. Will forward to dr Gardiner Rhyme to review and advise

## 2020-12-31 NOTE — Telephone Encounter (Signed)
BP looks OK but if having dizziness with standing would recommend holding spironolactone

## 2020-12-31 NOTE — Telephone Encounter (Signed)
Pt c/o BP issue: STAT if pt c/o blurred vision, one-sided weakness or slurred speech  1. What are your last 5 BP readings?  12/31/20 104/56  12/30/20 110/56-60 120/70's  2. Are you having any other symptoms (ex. Dizziness, headache, blurred vision, passed out)? Lightheaded   3. What is your BP issue? Maria Travis is calling stating she was advised to call if Maria Travis BP ran low. She states since the decrease in [place] her BP has started dropping again over the past 2 days. Maria Travis also complains of lightheadedness. Please advise.

## 2021-01-02 MED ORDER — SPIRONOLACTONE 25 MG PO TABS: 12.5000 mg | ORAL_TABLET | Freq: Every day | ORAL | 1 refills | Status: DC

## 2021-01-02 NOTE — Telephone Encounter (Signed)
Returned call and spoke to [facility] and patient-patient reports dizziness has improved.   BP this AM 106/62 HR 74, yesterday 110/68 HR 73.    Advised to hold spironolactone per MD and continue to monitor BP and HR.    Daughter verbalized understanding and will keep follow up on 3/18 as scheduled.

## 2021-01-08 NOTE — Progress Notes (Signed)
Cardiology Clinic Note   Patient Name: Makalyn Lennox Date of Encounter: 01/09/2021  Primary Care Provider:  Seward Carol, MD Primary Cardiologist:  Donato Heinz, MD  Patient [place] and 80 year old female presents the clinic today for follow-up evaluation of her persistent atrial fibrillation, hypertension, and acute systolic CHF.  Past Medical History    Past Medical History:  Diagnosis Date  . Atrial fibrillation ([place])   . Diverticulitis   . High cholesterol   . Hypertension    Past Surgical History:  Procedure Laterality Date  . CARDIOVERSION N/A 12/05/2020   Procedure: CARDIOVERSION;  Surgeon: Sanda Klein, MD;  [facility];  Service: Cardiovascular;  Laterality: N/A;  . CHOLECYSTECTOMY    . COLOSTOMY    . RIGHT/LEFT HEART CATH AND CORONARY ANGIOGRAPHY N/A 12/04/2020   Procedure: RIGHT/LEFT HEART CATH AND CORONARY ANGIOGRAPHY;  Surgeon: Belva Crome, MD;  Location: [place] [facility];  Service: Cardiovascular;  Laterality: N/A;  . TEE WITHOUT CARDIOVERSION N/A 12/05/2020   Procedure: TRANSESOPHAGEAL ECHOCARDIOGRAM (TEE);  Surgeon: Sanda Klein, MD;  Location: [facility] ENDOSCOPY;  Service: Cardiovascular;  Laterality: N/A;    Allergies  No Known Allergies  History of Present Illness    Ms. Del Aire has a PMH of hypertension, hyperlipidemia, pelvic abscess with sigmoid resection colostomy, and paroxysmal atrial fibrillation.  She was admitted to the hospital on 12/02/2020 with paroxysmal atrial fibrillation new-onset systolic CHF.  Echocardiogram 12/03/2020 showed an EF of 25-30%, left ventricular global hypokinesis, left ventricular interior cavity moderately dilated, intermediate diastolic parameters, moderate-severe tricuspid stenosis, aortic valve regurgitation mild.  Cardiac catheterization showed minimal nonobstructive CAD.  She became agitated during the catheterization and right heart cath was not able to be completed.   She underwent TEE cardioversion 12/05/2020.  She maintained sinus rhythm, was continued on amiodarone (plan 200 mg twice daily for 2 weeks then 200 mg daily thereafter).  She was continued on metoprolol succinate 100 mg daily and Eliquis 2.5 mg twice daily due to age and weight less than 60 kg.  Her acute systolic heart failure was felt to be tachycardia induced.  She was euvolemic on discharge and transition to furosemide 40 mg daily, Entresto 2426, and spironolactone 12.5 mg daily.  Her procedure was continued.  She was discharged on 12/08/2020 in stable condition.  She presented to the clinic 12/12/2020 for follow-up evaluation stated she felt fairly well.  Her main complaint was congestion in the morning.  She presented with her daughter.  Her weight had remained stable and she weighed 116.8 pounds today.  Her blood pressure was 120/66.  She had been tolerating her medications well without side effects.  She denied lower extremity swelling.  We reviewed the process of increasing Entresto.  We reviewed her angiography and echocardiogram.  I  asked her to use OTC plain Mucinex for her congestion, continue to follow a low-sodium diet, continue daily weights, return in 1 week for BMP, and follow-up in 1 month.  Her creatinine elevated to 1.47.  Her Entresto was decreased to 24/26.  She presents the clinic today for follow-up evaluation and states she feels well except for her right hip pain.  She contacted the office on 12/31/2020 and reported dizziness.  At that time she was instructed to hold her spironolactone.  Her dizziness has resolved.  She is tolerating her Entresto well.  And reports compliance with her furosemide and metoprolol.  She attempted to have labs drawn yesterday at PCP office.  However  it was felt she was dehydrated and labs were not able to be drawn.  She will hydrate over the weekend and have labs drawn on Monday.  We will request labs at that time.  I will have her follow-up echocardiogram  pleated and see Dr. Gardiner Rhyme in 2 months.  Today she denies chest pain, increased shortness of breath, lower extremity edema, fatigue, palpitations, melena, hematuria, hemoptysis, diaphoresis, weakness, presyncope, syncope, orthopnea, and PND.   Home Medications    Prior to Admission medications   Medication Sig Start Date End Date Taking? Authorizing Provider  allopurinol (ZYLOPRIM) 100 MG tablet Take 100 mg by mouth daily.    [provider]  amiodarone (PACERONE) 200 MG tablet Take 1 tablet (200 mg total) by mouth daily. 12/08/20   Lacinda Axon, MD  apixaban (ELIQUIS) 2.5 MG TABS tablet Take 2.5 mg by mouth 2 (two) times daily.    [provider]  atorvastatin (LIPITOR) 20 MG tablet Take 20 mg by mouth every evening.    [provider]  colchicine 0.6 MG tablet Take 0.6 mg by mouth daily.    [provider]  dapagliflozin propanediol (FARXIGA) 5 MG TABS tablet Take 1 tablet (5 mg total) by mouth daily. 12/09/20   Lacinda Axon, MD  folic acid (FOLVITE) 1 MG tablet Take 1 mg by mouth in the morning.    [provider]  furosemide (LASIX) 40 MG tablet Take 1 tablet (40 mg total) by mouth daily. 12/09/20   Lacinda Axon, MD  metoprolol succinate (TOPROL-XL) 100 MG 24 hr tablet Take 1 tablet (100 mg total) by mouth daily. Take with or immediately following a meal. 12/09/20   A[place]ons[place], Charisse March, MD  pantoprazole (PROTONIX) 40 MG tablet Take 40 mg by mouth daily before breakfast.    [provider]  potassium chloride 20 MEQ TBCR Take 20 mEq by mouth in the morning. 12/09/20   Lacinda Axon, MD  sacubitril-valsartan (ENTRESTO) 24-26 MG Take 1 tablet by mouth 2 (two) times daily. 12/26/20   Deberah Pelton, NP  spironolactone (ALDACTONE) 25 MG tablet Take 0.5 tablets (12.5 mg total) by mouth daily. ON HOLD AS OF 3/11 per Dr. Gardiner Rhyme, see phone note 01/02/21   Donato Heinz, MD  thiamine (VITAMIN B-1) 100 MG tablet  Take 100 mg by mouth daily.    [provider]    Family History    History reviewed. No pertinent family history. has no family status information on file.   Social History    Social History   Socioeconomic History  . Marital status: Single    Spouse name: Not on file  . Number of children: Not on file  . Years of education: Not on file  . Highest education level: Not on file  Occupational History  . Occupation: retired  Tobacco Use  . Smoking status: Never Smoker  . Smokeless tobacco: Never Used  Vaping Use  . Vaping Use: Never used  Substance and Sexual Activity  . Alcohol use: Yes    Comment: weekends  . Drug use: No  . Sexual activity: Not on file  Other Topics Concern  . Not on file  Social History Narrative  . Not on file   Social Determinants of Health   Financial Resource Strain: Not on file  Food Insecurity: Not on file  Transportation Needs: Not on file  Physical Activity: Not on file  Stress: Not on file  Social Connections: Not on file  Intimate Partner Violence: Not on file     Review of Systems    General:  No chills, fever, night sweats or weight changes.  Cardiovascular:  No chest pain, dyspnea on exertion, edema, orthopnea, palpitations, paroxysmal nocturnal dyspnea. Dermatological: No rash, lesions/masses Respiratory: No cough, dyspnea Urologic: No hematuria, dysuria Abdominal:   No nausea, vomiting, diarrhea, bright red blood per rectum, melena, or hematemesis Neurologic:  No visual changes, wkns, changes in mental status. All other systems reviewed and are otherwise negative except as noted above.  Physical Exam    VS:  BP 124/60 (BP Location: Left Arm, Patient Position: Sitting, Cuff Size: Normal)   Pulse 68   Wt 114 lb 6.4 oz (51.9 kg)   SpO2 96%   BMI 21.62 kg/m  , BMI Body mass index is 21.62 kg/m. GEN: Well nourished, well developed, in no acute distress. HEENT: normal. Neck: Supple, no JVD, carotid bruits, or  masses. Cardiac: RRR, no murmurs, rubs, or gallops. No clubbing, cyanosis, edema.  Radials/DP/PT 2+ and equal bilaterally.  Respiratory:  Respirations regular and unlabored, clear to auscultation bilaterally. GI: Soft, nontender, nondistended, BS + x 4. MS: no deformity or atrophy. Skin: warm and dry, no rash. Neuro:  Strength and sensation are intact. Psych: Normal affect.  Accessory Clinical Findings    Recent Labs: 10/18/2020: TSH 1.288 12/02/2020: ALT 20; B Natriuretic Peptide 768.8 12/06/2020: Magnesium 1.8 12/08/2020: Hemoglobin 12.2; Platelets 200 12/22/2020: BUN 46; Creatinine, Ser 1.47; Potassium 4.5; Sodium 143   Recent Lipid Panel    Component Value Date/Time   CHOL 162 10/18/2020 0208   TRIG 148 10/18/2020 0208   HDL 53 10/18/2020 0208   CHOLHDL 3.1 10/18/2020 0208   VLDL 30 10/18/2020 0208   LDLCALC 79 10/18/2020 0208    ECG personally reviewed by me today- none today  EKG 12/12/2020 normal sinus rhythm left axis deviation low voltage QRS and inferior infarct undetermined age cannot rule out anterior septal infarct undetermined age 45 bpm  TTE 12/03/2020 1. Left ventricular ejection fraction, by estimation, is 25 to 30%. The  left ventricle has severely decreased function. The left ventricle  demonstrates global hypokinesis. The left ventricular internal cavity size  was moderately dilated. Left  ventricular diastolic parameters are indeterminate.  2. Right ventricular systolic function is mildly reduced. The right  ventricular size is mildly enlarged. There is normal pulmonary artery  systolic pressure.  3. Left atrial size was mildly dilated.  4. Right atrial size was mildly dilated.  5. The mitral valve is degenerative. Mild mitral valve regurgitation. No  evidence of mitral stenosis. Moderate mitral annular calcification.  6. Tricuspid valve regurgitation is moderate. 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  7. The aortic valve is tricuspid. Aortic  valve regurgitation is mild.  Mild to moderate aortic valve sclerosis/calcification is present, without  any evidence of aortic stenosis.  8. The inferior vena cava is dilated in size with <50% respiratory  variability, suggesting right atrial pressure of 15 mmHg.    Cardiac catheterization 12/04/2020   Technically complicated procedure, predominantly because of inability to complete right heart cath. Unsuccessful attempt at right heart cath from right brachial approach. Right femoral approach was complicated by inability to advance into the pulmonary artery for pressure measurement and wedge recording. The patient became confused and agitated causing [place] to abort the right heart phase of the procedure.  PA systolic pressure 41 mmHg based upon the documented right ventricular pressure. Elevated right atrial mean pressure of 18 mmHg.  Normal right coronary, nondominant  Calcified ostial left main without obstruction.  Widely patent left anterior descending  Widely patent circumflex artery with TIMI grade II flow.  Because of reduced coronary flow, only 1 left coronary injection was performed. No angiographic evidence of air embolism or thrombus. Patient experienced no chest pain.  Altered mental status after 1 mg of IV Versed.  RECOMMENDATIONS:   Plan Per treating team.  Diagnostic Dominance: Left    Intervention   Assessment & Plan   1.   Persistent atrial fibrillation-heart rate today 68.   Underwent successful TEE cardioversion on 12/05/2020.  Started on amiodarone at that time.  Eliquis dose 2.5 due to age and weight less than 60 kg.  Denies bleeding issues. Continue Eliquis, amiodarone, metoprolol Heart healthy low-sodium diet-salty 6 given Increase physical activity as tolerated Avoid triggers caffeine, chocolate, EtOH, dehydration etc.  Acute systolic CHF-euvolemic today.  No increased DOE or activity intolerance today.  EF found to be 25-30% and  felt to be related to tachycardia.    Reports compliance with furosemide. Continue furosemide, spironolactone  Continue Entresto 24/26 heart healthy low-sodium diet-salty 6 given Increase physical activity as tolerated Repeat echocardiogram  AKI-creatinine 1.47 on 12/22/2020.  Increased to 1.35 and was 1.16 on discharge.  Baseline creatinine 0.96 Continue to monitor  BMP Monday PCP  Disposition: Follow-up with Dr. Gardiner Rhyme or me in 2 month.   Jossie Ng. Jhoan Schmieder NP-C    01/09/2021, 10:28 AM [place] [address] [address] Office [phone] Fax [phone]  Notice: This dictation was prepared with Dragon dictation along with smaller phrase technology. Any transcriptional errors that result from this process are unintentional and may not be corrected upon review.  I spent 12 minutes examining this patient, reviewing medications, and using patient centered shared decision making involving her cardiac care.  Prior to her visit I spent greater than 20 minutes reviewing her past medical history,  medications, and prior cardiac tests.

## 2021-01-09 ENCOUNTER — Encounter: Payer: Self-pay | Admitting: General Practice

## 2021-01-09 ENCOUNTER — Ambulatory Visit (INDEPENDENT_AMBULATORY_CARE_PROVIDER_SITE_OTHER): Payer: Medicare Other | Admitting: General Practice

## 2021-01-09 ENCOUNTER — Other Ambulatory Visit: Payer: Self-pay

## 2021-01-09 VITALS — BP 124/60 | HR 68 | Wt 114.4 lb

## 2021-01-09 DIAGNOSIS — I5021 Acute systolic (congestive) heart failure: Secondary | ICD-10-CM

## 2021-01-09 DIAGNOSIS — N179 Acute kidney failure, unspecified: Secondary | ICD-10-CM

## 2021-01-09 DIAGNOSIS — I4819 Other persistent atrial fibrillation: Secondary | ICD-10-CM

## 2021-01-09 MED ORDER — METOPROLOL SUCCINATE ER 100 MG PO TB24: 50.0000 mg | ORAL_TABLET | Freq: Every day | ORAL | 1 refills | Status: DC

## 2021-01-09 MED ORDER — FUROSEMIDE 40 MG PO TABS: 20.0000 mg | ORAL_TABLET | Freq: Every day | ORAL | 2 refills | Status: DC

## 2021-01-09 NOTE — Patient Instructions (Addendum)
Medication Instructions:  The current medical regimen is effective;  continue present plan and medications as directed. Please refer to the Current Medication list given to you today.  *If you need a refill on your cardiac medications before your next appointment, please call your pharmacy*  Lab Work: WITH DR POLITE Monday  Testing/Procedures: Echocardiogram - Your physician has requested that you have an echocardiogram. Echocardiography is a painless test that uses sound waves to create images of your heart. It provides your doctor with information about the size and shape of your heart and how well your heart's chambers and valves are working. This procedure takes approximately one hour. There are no restrictions for this procedure. This will be performed at our [facility] location - [address].  Special Instructions TAKE AND LOG YOUR BLOOD PRESSURE DAILY AND BRING WITH YOU TO YOUR FOLLOW UP APPOINTMENT-TAKE AT LEAST 1 HOUR AFTER TAKING MEDICATION  PLEASE READ AND FOLLOW SALTY 6-ATTACHED-1,800 mg daily  Follow-Up: Your next appointment:  2 month(s)-MAKE SURE THIS IS AFTER ECHO In Person with Oswaldo Milian, MD ONLY  At [facility], you and your health needs are our priority.  As part of our continuing mission to provide you with exceptional heart care, we have created designated Provider Care Teams.  These Care Teams include your primary Cardiologist (physician) and Advanced Practice Providers (APPs -  Physician Assistants and Nurse Practitioners) who all work together to provide you with the care you need, when you need it.  We recommend signing up for the patient portal called "MyChart".  Sign up information is provided on this After Visit Summary.  MyChart is used to connect with patients for Virtual Visits (Telemedicine).  Patients are able to view lab/test results, encounter notes, upcoming appointments, etc.  Non-urgent messages can be sent to your provider as  well.   To learn more about what you can do with MyChart, go to [email or web address].

## 2021-01-13 ENCOUNTER — Ambulatory Visit: Payer: Medicare Other | Admitting: Internal Medicine

## 2021-02-06 ENCOUNTER — Other Ambulatory Visit (HOSPITAL_COMMUNITY): Payer: Medicare Other

## 2021-02-09 ENCOUNTER — Ambulatory Visit (HOSPITAL_COMMUNITY): Payer: Medicare Other | Attending: Cardiology

## 2021-02-09 ENCOUNTER — Other Ambulatory Visit: Payer: Self-pay

## 2021-02-09 DIAGNOSIS — I5021 Acute systolic (congestive) heart failure: Secondary | ICD-10-CM | POA: Insufficient documentation

## 2021-02-09 LAB — ECHOCARDIOGRAM COMPLETE
Area-P 1/2: 4.29 cm2
P 1/2 time: 357 msec
S' Lateral: 3 cm

## 2021-02-12 ENCOUNTER — Encounter: Payer: Self-pay | Admitting: *Deleted

## 2021-03-04 ENCOUNTER — Ambulatory Visit
Admission: RE | Admit: 2021-03-04 | Discharge: 2021-03-04 | Disposition: A | Discharge status: Home / Self Care | Payer: Medicare Other | Source: Ambulatory Visit | Attending: Internal Medicine | Admitting: Internal Medicine

## 2021-03-04 ENCOUNTER — Other Ambulatory Visit: Payer: Self-pay | Admitting: Internal Medicine

## 2021-03-04 DIAGNOSIS — R109 Unspecified abdominal pain: Secondary | ICD-10-CM

## 2021-03-12 ENCOUNTER — Other Ambulatory Visit: Payer: Self-pay

## 2021-03-12 ENCOUNTER — Other Ambulatory Visit: Payer: Self-pay | Admitting: *Deleted

## 2021-03-12 ENCOUNTER — Encounter: Payer: Self-pay | Admitting: Cardiology

## 2021-03-12 ENCOUNTER — Ambulatory Visit (INDEPENDENT_AMBULATORY_CARE_PROVIDER_SITE_OTHER): Payer: Medicare Other | Admitting: Cardiology

## 2021-03-12 VITALS — BP 145/71 | HR 80 | Ht 60.0 in | Wt 116.8 lb

## 2021-03-12 DIAGNOSIS — I5042 Chronic combined systolic (congestive) and diastolic (congestive) heart failure: Secondary | ICD-10-CM

## 2021-03-12 DIAGNOSIS — R790 Abnormal level of blood mineral: Secondary | ICD-10-CM

## 2021-03-12 DIAGNOSIS — N179 Acute kidney failure, unspecified: Secondary | ICD-10-CM

## 2021-03-12 DIAGNOSIS — I4819 Other persistent atrial fibrillation: Secondary | ICD-10-CM | POA: Diagnosis not present

## 2021-03-12 DIAGNOSIS — I10 Essential (primary) hypertension: Secondary | ICD-10-CM | POA: Diagnosis not present

## 2021-03-12 DIAGNOSIS — Z79899 Other long term (current) drug therapy: Secondary | ICD-10-CM

## 2021-03-12 LAB — BASIC METABOLIC PANEL
BUN/Creatinine Ratio: 16 (ref 12–28)
BUN: 17 mg/dL (ref 8–27)
CO2: 20 mmol/L (ref 20–29)
Calcium: 8.7 mg/dL (ref 8.7–10.3)
Chloride: 112 mmol/L — ABNORMAL HIGH (ref 96–106)
Creatinine, Ser: 1.06 mg/dL — ABNORMAL HIGH (ref 0.57–1.00)
Glucose: 79 mg/dL (ref 65–99)
Potassium: 4.1 mmol/L (ref 3.5–5.2)
Sodium: 147 mmol/L — ABNORMAL HIGH (ref 134–144)
eGFR: 53 mL/min/{1.73_m2} — ABNORMAL LOW (ref >59)

## 2021-03-12 LAB — CBC
Hematocrit: 32.7 % — ABNORMAL LOW (ref 34.0–46.6)
Hemoglobin: 10.7 g/dL — ABNORMAL LOW (ref 11.1–15.9)
MCH: 29.6 pg (ref 26.6–33.0)
MCHC: 32.7 g/dL (ref 31.5–35.7)
MCV: 90 fL (ref 79–97)
Platelets: 188 10*3/uL (ref 150–450)
RBC: 3.62 x10E6/uL — ABNORMAL LOW (ref 3.77–5.28)
RDW: 15 % (ref 11.7–15.4)
WBC: 7.2 10*3/uL (ref 3.4–10.8)

## 2021-03-12 LAB — MAGNESIUM: Magnesium: 0.9 mg/dL — CL (ref 1.6–2.3)

## 2021-03-12 MED ORDER — MAGNESIUM OXIDE 400 MG PO TABS: 400.0000 mg | ORAL_TABLET | Freq: Two times a day (BID) | ORAL | 3 refills | Status: DC

## 2021-03-12 MED ORDER — FUROSEMIDE 20 MG PO TABS: 20.0000 mg | ORAL_TABLET | Freq: Every day | ORAL | 3 refills | Status: DC | PRN

## 2021-03-12 MED ORDER — POTASSIUM CHLORIDE ER 20 MEQ PO TBCR: 20.0000 meq | EXTENDED_RELEASE_TABLET | ORAL | 3 refills | Status: DC | PRN

## 2021-03-12 NOTE — Patient Instructions (Signed)
Medication Instructions:  Change furosemide (Lasix) to AS NEEDED --weigh yourself daily, write it down.  If you gain 3 lbs overnight or 5 lbs in 1 week, take dose of Lasix  *If you need a refill on your cardiac medications before your next appointment, please call your pharmacy*   Lab Work: BMET, CBC, Mag today  If you have labs (blood work) drawn today and your tests are completely normal, you will receive your results only by: Marland Kitchen MyChart Message (if you have MyChart) OR . A paper copy in the mail If you have any lab test that is abnormal or we need to change your treatment, we will call you to review the results.  Follow-Up: At [facility], you and your health needs are our priority.  As part of our continuing mission to provide you with exceptional heart care, we have created designated Provider Care Teams.  These Care Teams include your primary Cardiologist (physician) and Advanced Practice Providers (APPs -  Physician Assistants and Nurse Practitioners) who all work together to provide you with the care you need, when you need it.  We recommend signing up for the patient portal called "MyChart".  Sign up information is provided on this After Visit Summary.  MyChart is used to connect with patients for Virtual Visits (Telemedicine).  Patients are able to view lab/test results, encounter notes, upcoming appointments, etc.  Non-urgent messages can be sent to your provider as well.   To learn more about what you can do with MyChart, go to [email or web address].    Your next appointment:   3 month(s)  The format for your next appointment:   In Person  Provider:   Oswaldo Milian, MD

## 2021-03-12 NOTE — Progress Notes (Deleted)
Cardiology Office Note:    Date:  03/12/2021   ID:  Maria Maria, DOB [birth year], MRN [id number]  PCP:  Seward Carol, MD  Cardiologist:  Donato Heinz, MD  Electrophysiologist:  None   Referring MD: Seward Carol, MD   No chief complaint on file. ***  History of Present Illness:    Maria Maria is a 80 y.o. 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  She was admitted to [facility] on 12/02/2020 with atrial fibrillation with RVR and new onset systolic heart failure, EF 25 to 30%.  LHC showed normal coronary arteries.  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  She underwent TEE/cardioversion on 12/05/2020.  She was discharged on amiodarone.  Repeat echocardiogram on 02/09/2021 showed LVEF 73%, grade 2 diastolic dysfunction, normal RV function, mild left atrial dilatation, moderate right atrial dilatation, mild MR, moderate to severe TR.  Wt Readings from Last 3 Encounters:  03/12/21 116 lb 12.8 oz (53 kg)  01/09/21 114 lb 6.4 oz (51.9 kg)  12/12/20 116 lb 12.8 oz (53 kg)     Past Medical History:  Diagnosis Date  . Atrial fibrillation ([place])   . Diverticulitis   . High cholesterol   . Hypertension     Past Surgical History:  Procedure Laterality Date  . CARDIOVERSION N/A 12/05/2020   Procedure: CARDIOVERSION;  Surgeon: Sanda Klein, MD;  Location: [facility] ENDOSCOPY;  Service: Cardiovascular;  Laterality: N/A;  . CHOLECYSTECTOMY    . COLOSTOMY    . RIGHT/LEFT HEART CATH AND CORONARY ANGIOGRAPHY N/A 12/04/2020   Procedure: RIGHT/LEFT HEART CATH AND CORONARY ANGIOGRAPHY;  Surgeon: Belva Crome, MD;  Location: [place] CV LAB;  Service: Cardiovascular;  Laterality: N/A;  . TEE WITHOUT CARDIOVERSION N/A 12/05/2020   Procedure: TRANSESOPHAGEAL ECHOCARDIOGRAM (TEE);  Surgeon: Sanda Klein,  MD;  Location: [facility] ENDOSCOPY;  Service: Cardiovascular;  Laterality: N/A;    Current Medications: No outpatient medications have been marked as taking for the 03/12/21 encounter (Appointment) with Donato Heinz, MD.     Allergies:   Patient has no known allergies.   Social History   Socioeconomic History  . Marital status: Single    Spouse name: Not on file  . Number of children: Not on file  . Years of education: Not on file  . Highest education level: Not on file  Occupational History  . Occupation: retired  Tobacco Use  . Smoking status: Never Smoker  . Smokeless tobacco: Never Used  Vaping Use  . Vaping Use: Never used  Substance and Sexual Activity  . Alcohol use: Yes    Comment: weekends  . Drug use: No  . Sexual activity: Not on file  Other Topics Concern  . Not on file  Social History Narrative  . Not on file   Social Determinants of Health   Financial Resource Strain: Not on file  Food Insecurity: Not on file  Transportation Needs: Not on file  Physical Activity: Not on file  Stress: Not on file  Social Connections: Not on file     Family History: The patient's ***family history is not on file.  ROS:   Please see the history of present illness.    *** All other systems reviewed and are negative.  EKGs/Labs/Other Studies Reviewed:    The following studies were reviewed today: ***  EKG:  EKG is *** ordered today.  The ekg ordered  today demonstrates ***  Recent Labs: 10/18/2020: TSH 1.288 12/02/2020: ALT 20; B Natriuretic Peptide 768.8 12/06/2020: Magnesium 1.8 12/08/2020: Hemoglobin 12.2; Platelets 200 12/22/2020: BUN 46; Creatinine, Ser 1.47; Potassium 4.5; Sodium 143  Recent Lipid Panel    Component Value Date/Time   CHOL 162 10/18/2020 0208   TRIG 148 10/18/2020 0208   HDL 53 10/18/2020 0208   CHOLHDL 3.1 10/18/2020 0208   VLDL 30 10/18/2020 0208   LDLCALC 79 10/18/2020 0208    Physical Exam:    VS:  There were no vitals taken  for this visit.    Wt Readings from Last 3 Encounters:  01/09/21 114 lb 6.4 oz (51.9 kg)  12/12/20 116 lb 12.8 oz (53 kg)  12/08/20 114 lb 8 oz (51.9 kg)     GEN: *** Well nourished, well developed in no acute distress HEENT: Normal NECK: No JVD; No carotid bruits LYMPHATICS: No lymphadenopathy CARDIAC: ***RRR, no murmurs, rubs, gallops RESPIRATORY:  Clear to auscultation without rales, wheezing or rhonchi  ABDOMEN: Soft, non-tender, non-distended MUSCULOSKELETAL:  No edema; No deformity  SKIN: Warm and dry NEUROLOGIC:  Alert and oriented x 3 PSYCHIATRIC:  Normal affect   ASSESSMENT:    No diagnosis found. PLAN:    Chronic combined systolic and diastolic heart failure:She was admitted to [facility] on 12/02/2020 with atrial fibrillation with RVR and new onset systolic heart failure, EF 25 to 30%.  LHC showed normal coronary arteries.  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  Repeat echocardiogram on 02/09/2021 showed LVEF 01%, grade 2 diastolic dysfunction, normal RV function, mild left atrial dilatation, moderate right atrial dilatation, mild MR, moderate to severe TR. -Continue Entresto 24-26 mg twice daily -Continue Toprol-XL 100 mg daily -Continue Farxiga -She appears euvolemic on exam and reports recent hyponatremia on labs checked by PCP.  Will check BMP/magnesium recommend changing Lasix to as needed if gains more than 3 pounds in 1 day or 5 pounds in 1 week              Persistent atrial fibrillation: Presented to [facility] on 12/02/2020 with A. fib with RVR.  CHA2DS2-VASc 5 (CHF, hypertension, age x2, female).  Underwent successful TEE/cardioversion on 12/05/2020 -Continue amiodarone 200 mg daily -Continue Eliquis 2.5 mg twice daily (age greater than 80, weight less than 60 kg).  Will check CBC -Continue Toprol-XL 100 mg daily  Hypertension: Continue Entresto, spironolactone, metoprolol as above  AKI: Creatinine 1.47 on 12/22/2020.  We will recheck BMP.  We will hold Lasix as  above  RTC in 3 months   Medication Adjustments/Labs and Tests Ordered: Current medicines are reviewed at length with the patient today.  Concerns regarding medicines are outlined above.  No orders of the defined types were placed in this encounter.  No orders of the defined types were placed in this encounter.   There are no Patient Instructions on file for this visit.   Signed, Donato Heinz, MD  03/12/2021 12:30 AM    [facility]

## 2021-03-12 NOTE — Progress Notes (Signed)
Cardiology Office Note:    Date:  03/12/2021   ID:  [name], DOB May 28, 1941, MRN [id number]  PCP:  Seward Carol, MD  Cardiologist:  Donato Heinz, MD  Electrophysiologist:  None   Referring MD: Seward Carol, MD   Chief Complaint  Patient presents with  . Atrial Fibrillation    History of Present Illness:    [name] is a 80 y.o. 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  She was admitted to [facility] on 12/02/2020 with atrial fibrillation with RVR and new onset systolic heart failure, EF 25 to 30%.  LHC showed normal coronary arteries.  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  She underwent TEE/cardioversion on 12/05/2020.  She was discharged on amiodarone.  Today, she states that she is doing fairly well. She is accompanied by her daughter. Her daughter states that she has been keeping a chart of her blood pressure and heart rate since her last visit. Over the last few weeks her blood pressure has had systolic readings between the 086-578 and diastolic readings between 70-80. She states that she is experiencing lightheadedness when she wakes up in the morning. When this occurs she states that she holds onto a wall until her symptoms resolve. She also reports that she sometimes notices bilateral trace lower extremity swelling. On labs from May 11, Dr. Delfina Redwood reported that she was hypernatremic. She also states that she is continuing to take her Lasix as prescribed which she states has been causing urgency. She denies any SOB, chest pain, and palpitations.      Past Medical History:  Diagnosis Date  . Atrial fibrillation ([place])   . Diverticulitis   . High cholesterol   . Hypertension     Past Surgical History:  Procedure Laterality Date  . CARDIOVERSION N/A 12/05/2020   Procedure:  CARDIOVERSION;  Surgeon: Sanda Klein, MD;  Location: [facility] END[facility];  Service: Cardiovascular;  Laterality: N/A;  . CHOLECYSTECTOMY    . COLOSTOMY    . RIGHT/LEFT HEART CATH AND CORONARY ANGIOGRAPHY N/A 12/04/2020   Procedure: RIGHT/LEFT HEART CATH AND CORONARY ANGIOGRAPHY;  Surgeon: Belva Crome, MD;  Location: [facility];  Service: Cardiovascular;  Laterality: N/A;  . TEE WITHOUT CARDIOVERSION N/A 12/05/2020   Procedure: TRANSESOPHAGEAL ECHOCARDIOGRAM (TEE);  Surgeon: Sanda Klein, MD;  Location: [facility] ENDOSCOPY;  Service: Cardiovascular;  Laterality: N/A;    Current Medications: Current Meds  Medication Sig  . allopurinol (ZYLOPRIM) 100 MG tablet Take 100 mg by mouth daily.  Marland Kitchen amiodarone (PACERONE) 200 MG tablet Take 1 tablet (200 mg total) by mouth daily.  Marland Kitchen apixaban (ELIQUIS) 2.5 MG TABS tablet Take 2.5 mg by mouth 2 (two) times daily.  Marland Kitchen atorvastatin (LIPITOR) 20 MG tablet Take 20 mg by mouth every evening.  . colchicine 0.6 MG tablet Take 0.6 mg by mouth daily.  . dapagliflozin propanediol (FARXIGA) 5 MG TABS tablet Take 1 tablet (5 mg total) by mouth daily.  . folic acid (FOLVITE) 1 MG tablet Take 1 mg by mouth in the morning.  . metoprolol succinate (TOPROL-XL) 100 MG 24 hr tablet Take 0.5 tablets (50 mg total) by mouth daily. Take with or immediately following a meal.  . pantoprazole (PROTONIX) 40 MG tablet Take 40 mg by mouth daily before breakfast.  . potassium chloride 20 MEQ TBCR Take 20 mEq by mouth in the morning.  . sacubitril-valsartan (ENTRESTO)  24-26 MG Take 1 tablet by mouth 2 (two) times daily.  Marland Kitchen spironolactone (ALDACTONE) 25 MG tablet Take 0.5 tablets (12.5 mg total) by mouth daily. ON HOLD AS OF 3/11 per Dr. Gardiner Rhyme, see phone note  . thiamine (VITAMIN B-1) 100 MG tablet Take 100 mg by mouth daily.  . [DISCONTINUED] furosemide (LASIX) 40 MG tablet Take 0.5 tablets (20 mg total) by mouth daily.     Allergies:   Patient has no known allergies.   Social  History   Socioeconomic History  . Marital status: Single    Spouse name: Not on file  . Number of children: Not on file  . Years of education: Not on file  . Highest education level: Not on file  Occupational History  . Occupation: retired  Tobacco Use  . Smoking status: Never Smoker  . Smokeless tobacco: Never Used  Vaping Use  . Vaping Use: Never used  Substance and Sexual Activity  . Alcohol use: Yes    Comment: weekends  . Drug use: No  . Sexual activity: Not on file  Other Topics Concern  . Not on file  Social History Narrative  . Not on file   Social Determinants of Health   Financial Resource Strain: Not on file  Food Insecurity: Not on file  Transportation Needs: Not on file  Physical Activity: Not on file  Stress: Not on file  Social Connections: Not on file     Family History: The patient's family history is not on file.  ROS:   Please see the history of present illness.     All other systems reviewed and are negative.  EKGs/Labs/Other Studies Reviewed:    The following studies were reviewed today:   EKG:  03/12/2021- The ekg ordered today demonstrates Sinus Rhythm, Rate-80, Left axis deviation, Poor R wave progression    Recent Labs: 10/18/2020: TSH 1.288 12/02/2020: ALT 20; B Natriuretic Peptide 768.8 12/06/2020: Magnesium 1.8 12/08/2020: Hemoglobin 12.2; Platelets 200 12/22/2020: BUN 46; Creatinine, Ser 1.47; Potassium 4.5; Sodium 143  Recent Lipid Panel    Component Value Date/Time   CHOL 162 10/18/2020 0208   TRIG 148 10/18/2020 0208   HDL 53 10/18/2020 0208   CHOLHDL 3.1 10/18/2020 0208   VLDL 30 10/18/2020 0208   LDLCALC 79 10/18/2020 0208    Physical Exam:    VS:  BP (!) 145/71   Pulse 80   Ht 5' (1.524 m)   Wt 116 lb 12.8 oz (53 kg)   SpO2 96%   BMI 22.81 kg/m     Wt Readings from Last 3 Encounters:  03/12/21 116 lb 12.8 oz (53 kg)  01/09/21 114 lb 6.4 oz (51.9 kg)  12/12/20 116 lb 12.8 oz (53 kg)     GEN:  in no acute  distress HEENT: Normal NECK: No JVD; No carotid bruits CARDIAC: IRR, 2/6 systolic murmur, no rubs, no gallops RESPIRATORY:  Clear to auscultation without rales, wheezing or rhonchi  ABDOMEN: Soft, non-tender, non-distended MUSCULOSKELETAL:  No edema; No deformity  SKIN: Warm and dry NEUROLOGIC:  Alert and oriented x 3 PSYCHIATRIC:  Normal affect   ASSESSMENT:    1. Chronic combined systolic and diastolic heart failure (HCC)   2. Persistent atrial fibrillation ([place])   3. AKI (acute kidney injury) ([place])   4. Essential hypertension    PLAN:    Chronic combined systolic and diastolic heart failure:She was admitted to [facility] on 12/02/2020 with atrial fibrillation with RVR and new onset systolic heart failure,  EF 25 to 30%.  LHC showed normal coronary arteries.  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  Repeat echocardiogram on 02/09/2021 showed LVEF 95%, grade 2 diastolic dysfunction, normal RV function, mild left atrial dilatation, moderate right atrial dilatation, mild MR, moderate to severe TR. -Continue Entresto 24-26 mg twice daily -Continue Toprol-XL 100 mg daily -Continue Farxiga -She appears euvolemic on exam and reports recent hypernatremia on labs checked by PCP.  Will check BMP/magnesium and  recommend changing Lasix to as needed if gains more than 3 pounds in 1 day or 5 pounds in 1 week              Persistent atrial fibrillation: Presented to [facility] on 12/02/2020 with A. fib with RVR.  CHA2DS2-VASc 5 (CHF, hypertension, age x2, female).  Underwent successful TEE/cardioversion on 12/05/2020 -Continue amiodarone 200 mg daily -Continue Eliquis 2.5 mg twice daily (age greater than 80, weight less than 60 kg).  Will check CBC -Continue Toprol-XL 100 mg daily  Hypertension: Continue Entresto, metoprolol as above.  BP has been elevated, will check kidney function/electrolytes and likely increase Entresto if stable labs  AKI: Creatinine 1.47 on 12/22/2020.  We will recheck BMP.  We will  hold Lasix as above  RTC in 3 months  Medication Adjustments/Labs and Tests Ordered: Current medicines are reviewed at length with the patient today.  Concerns regarding medicines are outlined above.  Orders Placed This Encounter  Procedures  . Basic metabolic panel  . Magnesium  . CBC  . EKG 12-Lead   Meds ordered this encounter  Medications  . furosemide (LASIX) 20 MG tablet    Sig: Take 1 tablet (20 mg total) by mouth daily as needed (weight gain of 3 lbs overnight or 5 lbs in 1 week).    Dispense:  30 tablet    Refill:  3    Patient Instructions  Medication Instructions:  Change furosemide (Lasix) to AS NEEDED --weigh yourself daily, write it down.  If you gain 3 lbs overnight or 5 lbs in 1 week, take dose of Lasix  *If you need a refill on your cardiac medications before your next appointment, please call your pharmacy*   Lab Work: BMET, CBC, Mag today  If you have labs (blood work) drawn today and your tests are completely normal, you will receive your results only by: Marland Kitchen MyChart Message (if you have MyChart) OR . A paper copy in the mail If you have any lab test that is abnormal or we need to change your treatment, we will call you to review the results.  Follow-Up: At [facility], you and your health needs are our priority.  As part of our continuing mission to provide you with exceptional heart care, we have created designated Provider Care Teams.  These Care Teams include your primary Cardiologist (physician) and Advanced Practice Providers (APPs -  Physician Assistants and Nurse Practitioners) who all work together to provide you with the care you need, when you need it.  We recommend signing up for the patient portal called "MyChart".  Sign up information is provided on this After Visit Summary.  MyChart is used to connect with patients for Virtual Visits (Telemedicine).  Patients are able to view lab/test results, encounter notes, upcoming appointments, etc.   Non-urgent messages can be sent to your provider as well.   To learn more about what you can do with MyChart, go to [email or web address].    Your next appointment:   3 month(s)  The format for your  next appointment:   In Person  Provider:   Oswaldo Milian, MD        Ella Bodo as a scribe for Donato Heinz, MD.,have documented all relevant documentation on the behalf of Donato Heinz, MD,as directed by  Donato Heinz, MD while in the presence of Donato Heinz, MD.  I, Donato Heinz, MD, have reviewed all documentation for this visit. The documentation on 03/12/21 for the exam, diagnosis, procedures, and orders are all accurate and complete.   Signed, Donato Heinz, MD  03/12/2021 9:42 AM    [facility]

## 2021-03-17 ENCOUNTER — Telehealth: Payer: Self-pay | Admitting: Cardiology

## 2021-03-17 NOTE — Telephone Encounter (Signed)
Returned call to patient's daughter, Maria Travis, who states patient has gained 3 lbs over the past 3 days and she gave the patient furosemide 20 mg this morning. Patient resides at [facility] and daughter goes each morning to check patient's weight and BP. BP today at 1000 168/77, takes anti-hypertensive medication at approximately 0700. I advised that Dr. Gardiner Rhyme wanted the patient to take furosemide with a 3 lb weight gain overnight or 5 lbs in 1 week per avs from office visit on 5/19. Daughter states she misunderstood the instructions given at the last appointment but she was able to verbalize understanding back to RN. I advised her to keep a record of daily weights and BP readings and to keep that information for follow-up. Patient has appointment for repeat lab later this week and is aware we will call at a later time with the results and any additional advice. [name] verbalized understanding and agreement with plan and thanked me for the call.

## 2021-03-17 NOTE — Telephone Encounter (Signed)
[name] called to say that her bp has been high since the 5/19 visit also states that the patient has gain 3lbs over last couple of days. [name] stated that she start back giving her the last is morning! Please advise

## 2021-03-19 ENCOUNTER — Telehealth: Payer: Self-pay

## 2021-03-19 ENCOUNTER — Other Ambulatory Visit: Payer: Self-pay

## 2021-03-19 DIAGNOSIS — I10 Essential (primary) hypertension: Secondary | ICD-10-CM

## 2021-03-19 DIAGNOSIS — I5042 Chronic combined systolic (congestive) and diastolic (congestive) heart failure: Secondary | ICD-10-CM

## 2021-03-19 MED ORDER — ENTRESTO 49-51 MG PO TABS: 1.0000 | ORAL_TABLET | Freq: Two times a day (BID) | ORAL | 3 refills | Status: DC

## 2021-03-19 NOTE — Telephone Encounter (Signed)
Patient walked in office to have lab.Daughter stated mother's B/P has been elevated since she saw Dr.Schumann last week and he made several medication changes.B/P this morning 1 hour after taking medications 182/80,189/91.B/P at dentist this morning 176/102.B/P at office at present 197/107 pulse 83.Daughter stated mother is eating too much salt.Advised of a low salt diet.Dr.Schumann is out of office.I will send message to him for advice.

## 2021-03-19 NOTE — Telephone Encounter (Signed)
Spoke to patient's daughter [name] Dr.Schumann's advice given.Advised to continue to monitor B/P and call back if elevated.

## 2021-03-19 NOTE — Telephone Encounter (Signed)
Recommend increase entresto to 49-51 mg BID and repeat BMET in 1 week

## 2021-03-19 NOTE — Telephone Encounter (Signed)
Spoke to patient's daughter Maria Travis she stated mother's B/P is alittle better at present 176/82.Stated she lives at [facility] in independent living.Stated she talked to her about eating a low salt diet.She has been eating crackers and soups.Advised Dr.Schumann is out of office today.Advised message sent to him for advice.

## 2021-03-19 NOTE — Telephone Encounter (Signed)
Spoke to patient's daughter [name].Dr.Schumann reviewed message sent to him earlier about elevated B/P.He advised to increase Entresto to 49/51 mg twice a day.Bmet in 1 week.New prescription sent to pharmacy.Order placed for bmet in 1 week.

## 2021-03-20 LAB — BASIC METABOLIC PANEL
BUN/Creatinine Ratio: 16 (ref 12–28)
BUN: 12 mg/dL (ref 8–27)
CO2: 21 mmol/L (ref 20–29)
Calcium: 9.1 mg/dL (ref 8.7–10.3)
Chloride: 106 mmol/L (ref 96–106)
Creatinine, Ser: 0.76 mg/dL (ref 0.57–1.00)
Glucose: 90 mg/dL (ref 65–99)
Potassium: 3.4 mmol/L — ABNORMAL LOW (ref 3.5–5.2)
Sodium: 145 mmol/L — ABNORMAL HIGH (ref 134–144)
eGFR: 79 mL/min/{1.73_m2} (ref >59)

## 2021-03-20 LAB — MAGNESIUM: Magnesium: 1.4 mg/dL — ABNORMAL LOW (ref 1.6–2.3)

## 2021-03-20 NOTE — Addendum Note (Signed)
Addended by: Patria Mane A on: 03/20/2021 02:10 PM   Modules accepted: Orders

## 2021-03-27 LAB — BASIC METABOLIC PANEL
BUN/Creatinine Ratio: 13 (ref 12–28)
BUN: 12 mg/dL (ref 8–27)
CO2: 21 mmol/L (ref 20–29)
Calcium: 9.8 mg/dL (ref 8.7–10.3)
Chloride: 108 mmol/L — ABNORMAL HIGH (ref 96–106)
Creatinine, Ser: 0.95 mg/dL (ref 0.57–1.00)
Glucose: 107 mg/dL — ABNORMAL HIGH (ref 65–99)
Potassium: 3.6 mmol/L (ref 3.5–5.2)
Sodium: 144 mmol/L (ref 134–144)
eGFR: 60 mL/min/{1.73_m2} (ref >59)

## 2021-03-27 LAB — MAGNESIUM: Magnesium: 1.6 mg/dL (ref 1.6–2.3)

## 2021-03-31 MED ORDER — MAGNESIUM OXIDE 400 MG PO TABS: 400.0000 mg | ORAL_TABLET | Freq: Every day | ORAL | 3 refills | Status: DC

## 2021-03-31 NOTE — Addendum Note (Signed)
Addended by: Patria Mane A on: 03/31/2021 10:25 AM   Modules accepted: Orders

## 2021-04-20 ENCOUNTER — Ambulatory Visit
Admission: RE | Admit: 2021-04-20 | Discharge: 2021-04-20 | Disposition: A | Discharge status: Home / Self Care | Payer: Medicare Other | Source: Ambulatory Visit | Attending: Physician Assistant | Admitting: Physician Assistant

## 2021-04-20 ENCOUNTER — Other Ambulatory Visit: Payer: Self-pay | Admitting: Physician Assistant

## 2021-04-20 ENCOUNTER — Other Ambulatory Visit: Payer: Self-pay

## 2021-04-20 DIAGNOSIS — R059 Cough, unspecified: Secondary | ICD-10-CM

## 2021-04-20 DIAGNOSIS — R609 Edema, unspecified: Secondary | ICD-10-CM

## 2021-05-06 ENCOUNTER — Ambulatory Visit
Admission: RE | Admit: 2021-05-06 | Discharge: 2021-05-06 | Disposition: A | Discharge status: Home / Self Care | Payer: Medicare Other | Source: Ambulatory Visit | Attending: Physician Assistant | Admitting: Physician Assistant

## 2021-05-06 ENCOUNTER — Other Ambulatory Visit: Payer: Self-pay | Admitting: Physician Assistant

## 2021-05-06 DIAGNOSIS — J189 Pneumonia, unspecified organism: Secondary | ICD-10-CM

## 2021-05-18 ENCOUNTER — Inpatient Hospital Stay (HOSPITAL_COMMUNITY)
Admission: EM | Admit: 2021-05-18 | Discharge: 2021-05-21 | DRG: 291 | Disposition: A | Discharge status: Home Health | Payer: Medicare Other | Attending: Internal Medicine | Admitting: Internal Medicine

## 2021-05-18 ENCOUNTER — Other Ambulatory Visit: Payer: Self-pay

## 2021-05-18 DIAGNOSIS — Z9049 Acquired absence of other specified parts of digestive tract: Secondary | ICD-10-CM

## 2021-05-18 DIAGNOSIS — E7800 Pure hypercholesterolemia, unspecified: Secondary | ICD-10-CM | POA: Diagnosis present

## 2021-05-18 DIAGNOSIS — Z933 Colostomy status: Secondary | ICD-10-CM

## 2021-05-18 DIAGNOSIS — Z7901 Long term (current) use of anticoagulants: Secondary | ICD-10-CM

## 2021-05-18 DIAGNOSIS — I110 Hypertensive heart disease with heart failure: Principal | ICD-10-CM | POA: Diagnosis present

## 2021-05-18 DIAGNOSIS — I5033 Acute on chronic diastolic (congestive) heart failure: Secondary | ICD-10-CM | POA: Diagnosis not present

## 2021-05-18 DIAGNOSIS — R4189 Other symptoms and signs involving cognitive functions and awareness: Secondary | ICD-10-CM | POA: Diagnosis present

## 2021-05-18 DIAGNOSIS — E876 Hypokalemia: Secondary | ICD-10-CM | POA: Diagnosis present

## 2021-05-18 DIAGNOSIS — E785 Hyperlipidemia, unspecified: Secondary | ICD-10-CM | POA: Diagnosis present

## 2021-05-18 DIAGNOSIS — F0390 Unspecified dementia without behavioral disturbance: Secondary | ICD-10-CM | POA: Diagnosis present

## 2021-05-18 DIAGNOSIS — I480 Paroxysmal atrial fibrillation: Secondary | ICD-10-CM | POA: Diagnosis present

## 2021-05-18 DIAGNOSIS — I10 Essential (primary) hypertension: Secondary | ICD-10-CM | POA: Diagnosis present

## 2021-05-18 DIAGNOSIS — K5792 Diverticulitis of intestine, part unspecified, without perforation or abscess without bleeding: Secondary | ICD-10-CM | POA: Diagnosis present

## 2021-05-18 DIAGNOSIS — Z20822 Contact with and (suspected) exposure to covid-19: Secondary | ICD-10-CM | POA: Diagnosis present

## 2021-05-18 DIAGNOSIS — K2970 Gastritis, unspecified, without bleeding: Secondary | ICD-10-CM | POA: Diagnosis present

## 2021-05-18 DIAGNOSIS — I509 Heart failure, unspecified: Secondary | ICD-10-CM

## 2021-05-18 DIAGNOSIS — Z79899 Other long term (current) drug therapy: Secondary | ICD-10-CM

## 2021-05-18 DIAGNOSIS — R140 Abdominal distension (gaseous): Secondary | ICD-10-CM | POA: Diagnosis present

## 2021-05-18 HISTORY — DX: Chronic diastolic (congestive) heart failure: I50.32

## 2021-05-18 LAB — CBC WITH DIFFERENTIAL/PLATELET
Abs Immature Granulocytes: 0.02 10*3/uL (ref 0.00–0.07)
Basophils Absolute: 0 10*3/uL (ref 0.0–0.1)
Basophils Relative: 0 %
Eosinophils Absolute: 0.2 10*3/uL (ref 0.0–0.5)
Eosinophils Relative: 2 %
HCT: 37.1 % (ref 36.0–46.0)
Hemoglobin: 11.2 g/dL — ABNORMAL LOW (ref 12.0–15.0)
Immature Granulocytes: 0 %
Lymphocytes Relative: 41 %
Lymphs Abs: 2.5 10*3/uL (ref 0.7–4.0)
MCH: 30.3 pg (ref 26.0–34.0)
MCHC: 30.2 g/dL (ref 30.0–36.0)
MCV: 100.3 fL — ABNORMAL HIGH (ref 80.0–100.0)
Monocytes Absolute: 0.6 10*3/uL (ref 0.1–1.0)
Monocytes Relative: 9 %
Neutro Abs: 2.9 10*3/uL (ref 1.7–7.7)
Neutrophils Relative %: 48 %
Platelets: 192 10*3/uL (ref 150–400)
RBC: 3.7 MIL/uL — ABNORMAL LOW (ref 3.87–5.11)
RDW: 17.2 % — ABNORMAL HIGH (ref 11.5–15.5)
WBC: 6.2 10*3/uL (ref 4.0–10.5)
nRBC: 0.6 % — ABNORMAL HIGH (ref 0.0–0.2)

## 2021-05-18 LAB — COMPREHENSIVE METABOLIC PANEL
ALT: 36 U/L (ref 0–44)
AST: 32 U/L (ref 15–41)
Albumin: 3.3 g/dL — ABNORMAL LOW (ref 3.5–5.0)
Alkaline Phosphatase: 90 U/L (ref 38–126)
Anion gap: 8 (ref 5–15)
BUN: 24 mg/dL — ABNORMAL HIGH (ref 8–23)
CO2: 20 mmol/L — ABNORMAL LOW (ref 22–32)
Calcium: 9.2 mg/dL (ref 8.9–10.3)
Chloride: 116 mmol/L — ABNORMAL HIGH (ref 98–111)
Creatinine, Ser: 1.39 mg/dL — ABNORMAL HIGH (ref 0.44–1.00)
GFR, Estimated: 38 mL/min — ABNORMAL LOW (ref >60)
Glucose, Bld: 110 mg/dL — ABNORMAL HIGH (ref 70–99)
Potassium: 3.7 mmol/L (ref 3.5–5.1)
Sodium: 144 mmol/L (ref 135–145)
Total Bilirubin: 0.6 mg/dL (ref 0.3–1.2)
Total Protein: 6.4 g/dL — ABNORMAL LOW (ref 6.5–8.1)

## 2021-05-18 LAB — LIPASE, BLOOD: Lipase: 53 U/L — ABNORMAL HIGH (ref 11–51)

## 2021-05-18 NOTE — ED Provider Notes (Signed)
Emergency Medicine Provider Triage Evaluation Note  [facility] , a 80 y.o. female  was evaluated in triage.  Pt complains of abdominal pain for several months, states that the abdominal pain has been getting worse and she was told by her primary care if that pain got worse to come here.  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  Patient states that she has been noticing that when she swallows a whole pill it will come into her colostomy in the full form.  Denies any bloody stool, does not do some diarrhea in her ostomy bag.  Denies any fevers or chills nausea or vomiting.  Review of Systems  Positive: Abdominal pain Negative: Fevers  Physical Exam  BP (!) 158/100 (BP Location: Right Arm)   Pulse 79   Temp 99.1 F (37.3 C) (Oral)   Resp 15   SpO2 100%  Gen:   Awake, no distress   Resp:  Normal effort  MSK:   Moves extremities without difficulty  Other:  Abdomen is soft with colostomy in place.  Generalized abdominal pain. Medical Decision Making  Medically screening exam initiated at 5:23 PM.  Appropriate orders placed.  Maria Travis was informed that the remainder of the evaluation will be completed by another provider, this initial triage assessment does not replace that evaluation, and the importance of remaining in the ED until their evaluation is complete.     Maria Client, PA-C 05/18/21 [place], [place], DO 05/18/21 [id number]

## 2021-05-18 NOTE — ED Triage Notes (Signed)
Pt c/o abd pain "for awhile." Pt has colostomy in place, states no issue w colostomy but that OTC medication has not helped abd pain. Has been to PCP & told if pain gets worse, be further evaluated.

## 2021-05-19 ENCOUNTER — Other Ambulatory Visit: Payer: Self-pay | Admitting: Cardiology

## 2021-05-19 ENCOUNTER — Emergency Department (HOSPITAL_COMMUNITY): Payer: Medicare Other

## 2021-05-19 ENCOUNTER — Encounter (HOSPITAL_COMMUNITY): Payer: Self-pay | Admitting: Emergency Medicine

## 2021-05-19 DIAGNOSIS — I5033 Acute on chronic diastolic (congestive) heart failure: Secondary | ICD-10-CM | POA: Diagnosis present

## 2021-05-19 DIAGNOSIS — K2970 Gastritis, unspecified, without bleeding: Secondary | ICD-10-CM | POA: Diagnosis present

## 2021-05-19 LAB — URINALYSIS, ROUTINE W REFLEX MICROSCOPIC
Bacteria, UA: NONE SEEN
Bilirubin Urine: NEGATIVE
Glucose, UA: NEGATIVE mg/dL
Ketones, ur: NEGATIVE mg/dL
Leukocytes,Ua: NEGATIVE
Nitrite: NEGATIVE
Protein, ur: 100 mg/dL — AB
Specific Gravity, Urine: 1.02 (ref 1.005–1.030)
pH: 5 (ref 5.0–8.0)

## 2021-05-19 LAB — BRAIN NATRIURETIC PEPTIDE: B Natriuretic Peptide: 2171.9 pg/mL — ABNORMAL HIGH (ref 0.0–100.0)

## 2021-05-19 LAB — TROPONIN I (HIGH SENSITIVITY): Troponin I (High Sensitivity): 10 ng/L (ref <18)

## 2021-05-19 MED ORDER — APIXABAN 2.5 MG PO TABS
2.5000 mg | ORAL_TABLET | Freq: Two times a day (BID) | ORAL | Status: DC
  Administered 2021-05-19 – 2021-05-21 (×4): 2.5 mg via ORAL
  Filled 2021-05-19 (×4): qty 1

## 2021-05-19 MED ORDER — ONDANSETRON HCL 4 MG/2ML IJ SOLN: 4.0000 mg | Freq: Four times a day (QID) | INTRAMUSCULAR | Status: DC | PRN

## 2021-05-19 MED ORDER — MAGNESIUM OXIDE -MG SUPPLEMENT 400 (240 MG) MG PO TABS
400.0000 mg | ORAL_TABLET | Freq: Every day | ORAL | Status: DC
  Administered 2021-05-20 – 2021-05-21 (×2): 400 mg via ORAL
  Filled 2021-05-19 (×2): qty 1

## 2021-05-19 MED ORDER — DOCUSATE SODIUM 100 MG PO CAPS
100.0000 mg | ORAL_CAPSULE | Freq: Two times a day (BID) | ORAL | Status: DC
  Administered 2021-05-20 – 2021-05-21 (×3): 100 mg via ORAL
  Filled 2021-05-19 (×3): qty 1

## 2021-05-19 MED ORDER — IOHEXOL 300 MG/ML  SOLN
100.0000 mL | Freq: Once | INTRAMUSCULAR | Status: AC | PRN
  Administered 2021-05-19: 100 mL via INTRAVENOUS

## 2021-05-19 MED ORDER — TRAZODONE HCL 50 MG PO TABS
25.0000 mg | ORAL_TABLET | Freq: Every evening | ORAL | Status: DC | PRN
  Administered 2021-05-19: 25 mg via ORAL
  Filled 2021-05-19: qty 1

## 2021-05-19 MED ORDER — POLYETHYLENE GLYCOL 3350 17 G PO PACK: 17.0000 g | PACK | Freq: Every day | ORAL | Status: DC | PRN

## 2021-05-19 MED ORDER — COLCHICINE 0.6 MG PO TABS
0.6000 mg | ORAL_TABLET | Freq: Every day | ORAL | Status: DC
  Administered 2021-05-20 – 2021-05-21 (×2): 0.6 mg via ORAL
  Filled 2021-05-19 (×2): qty 1

## 2021-05-19 MED ORDER — BISACODYL 5 MG PO TBEC: 5.0000 mg | DELAYED_RELEASE_TABLET | Freq: Every day | ORAL | Status: DC | PRN

## 2021-05-19 MED ORDER — FUROSEMIDE 10 MG/ML IJ SOLN
40.0000 mg | Freq: Two times a day (BID) | INTRAMUSCULAR | Status: DC
  Administered 2021-05-19 – 2021-05-21 (×4): 40 mg via INTRAVENOUS
  Filled 2021-05-19 (×4): qty 4

## 2021-05-19 MED ORDER — HYDRALAZINE HCL 20 MG/ML IJ SOLN
5.0000 mg | INTRAMUSCULAR | Status: DC | PRN
  Administered 2021-05-19: 5 mg via INTRAVENOUS
  Filled 2021-05-19: qty 1

## 2021-05-19 MED ORDER — PANTOPRAZOLE SODIUM 40 MG PO TBEC: 40.0000 mg | DELAYED_RELEASE_TABLET | Freq: Every day | ORAL | Status: DC

## 2021-05-19 MED ORDER — MORPHINE SULFATE (PF) 2 MG/ML IV SOLN: 2.0000 mg | INTRAVENOUS | Status: DC | PRN

## 2021-05-19 MED ORDER — SODIUM CHLORIDE 0.9% FLUSH
3.0000 mL | Freq: Two times a day (BID) | INTRAVENOUS | Status: DC
  Administered 2021-05-19 – 2021-05-21 (×3): 3 mL via INTRAVENOUS

## 2021-05-19 MED ORDER — OXYCODONE HCL 5 MG PO TABS: 5.0000 mg | ORAL_TABLET | ORAL | Status: DC | PRN

## 2021-05-19 MED ORDER — PANTOPRAZOLE SODIUM 40 MG IV SOLR
40.0000 mg | Freq: Two times a day (BID) | INTRAVENOUS | Status: DC
  Administered 2021-05-19: 40 mg via INTRAVENOUS
  Filled 2021-05-19 (×2): qty 40

## 2021-05-19 MED ORDER — ATORVASTATIN CALCIUM 10 MG PO TABS
20.0000 mg | ORAL_TABLET | Freq: Every evening | ORAL | Status: DC
  Administered 2021-05-19 – 2021-05-20 (×2): 20 mg via ORAL
  Filled 2021-05-19 (×2): qty 2

## 2021-05-19 MED ORDER — ACETAMINOPHEN 325 MG PO TABS: 650.0000 mg | ORAL_TABLET | Freq: Four times a day (QID) | ORAL | Status: DC | PRN

## 2021-05-19 MED ORDER — ACETAMINOPHEN 650 MG RE SUPP: 650.0000 mg | Freq: Four times a day (QID) | RECTAL | Status: DC | PRN

## 2021-05-19 MED ORDER — ONDANSETRON HCL 4 MG PO TABS: 4.0000 mg | ORAL_TABLET | Freq: Four times a day (QID) | ORAL | Status: DC | PRN

## 2021-05-19 MED ORDER — ALLOPURINOL 100 MG PO TABS
100.0000 mg | ORAL_TABLET | Freq: Every day | ORAL | Status: DC
  Administered 2021-05-20 – 2021-05-21 (×2): 100 mg via ORAL
  Filled 2021-05-19 (×2): qty 1

## 2021-05-19 MED ORDER — SUCRALFATE 1 G PO TABS
1.0000 g | ORAL_TABLET | Freq: Three times a day (TID) | ORAL | Status: DC
  Administered 2021-05-19 – 2021-05-21 (×7): 1 g via ORAL
  Filled 2021-05-19 (×7): qty 1

## 2021-05-19 MED ORDER — AMIODARONE HCL 200 MG PO TABS
200.0000 mg | ORAL_TABLET | Freq: Every day | ORAL | Status: DC
  Administered 2021-05-20 – 2021-05-21 (×2): 200 mg via ORAL
  Filled 2021-05-19 (×2): qty 1

## 2021-05-19 MED ORDER — FUROSEMIDE 10 MG/ML IJ SOLN
60.0000 mg | Freq: Once | INTRAMUSCULAR | Status: AC
  Administered 2021-05-19: 60 mg via INTRAVENOUS
  Filled 2021-05-19: qty 6

## 2021-05-19 MED ORDER — METOPROLOL SUCCINATE ER 50 MG PO TB24
50.0000 mg | ORAL_TABLET | Freq: Every day | ORAL | Status: DC
  Administered 2021-05-20 – 2021-05-21 (×2): 50 mg via ORAL
  Filled 2021-05-19 (×2): qty 1

## 2021-05-19 NOTE — ED Notes (Signed)
Attempted to call report to 3E. Nurse will call back.

## 2021-05-19 NOTE — ED Provider Notes (Signed)
[facility]MENT Provider Note   CSN: [id number] Arrival date & time: 05/18/21  1536     History Chief Complaint  Patient presents with   Abdominal Pain    [facility] is a 80 y.o. female.  Patient presents to ER chief complaint of shortness of breath and abdominal pain.  The shortness of breath has been going on for the past 2 weeks but worsening in the last 2 to 3 days.  She also complains of diffuse abdominal pain ongoing for 2 to 3 months.  She seen her doctor for this a couple times but was told to continue medication at home with no other changes.  She otherwise denies any fevers or cough denies chest pain.  Denies vomiting or diarrhea.  She has a colostomy in place which has having normal output, nonbloody      Past Medical History:  Diagnosis Date   Atrial fibrillation ([place])    Diverticulitis    High cholesterol    Hypertension     Patient Active Problem List   Diagnosis Date Noted   Acute HFrEF (heart failure with reduced ejection fraction) ([place])    Enterocolitis    Atrial fibrillation with RVR ([place]) 12/02/2020   Elevated troponin 10/18/2020   Hypertension 10/18/2020   Hyperlipidemia 10/18/2020   AKI (acute kidney injury) ([place]) 10/18/2020   Paroxysmal atrial fibrillation ([place]) 10/17/2020   Atrial fibrillation, rapid ([place])    Hypokalemia    Syncope     Past Surgical History:  Procedure Laterality Date   CARDIOVERSION N/A 12/05/2020   Procedure: CARDIOVERSION;  Surgeon: Sanda Klein, MD;  Location: [facility];  Service: Cardiovascular;  Laterality: N/A;   CHOLECYSTECTOMY     COLOSTOMY     RIGHT/LEFT HEART CATH AND CORONARY ANGIOGRAPHY N/A 12/04/2020   Procedure: RIGHT/LEFT HEART CATH AND CORONARY ANGIOGRAPHY;  Surgeon: Belva Crome, MD;  Location: [facility];  Service: Cardiovascular;  Laterality: N/A;   TEE WITHOUT CARDIOVERSION N/A 12/05/2020   Procedure: TRANSESOPHAGEAL ECHOCARDIOGRAM (TEE);  Surgeon:  Sanda Klein, MD;  Location: [facility];  Service: Cardiovascular;  Laterality: N/A;     OB History   No obstetric history on file.     History reviewed. No pertinent family history.  Social History   Tobacco Use   Smoking status: Never   Smokeless tobacco: Never  Vaping Use   Vaping Use: Never used  Substance Use Topics   Alcohol use: Yes    Comment: weekends   Drug use: No    Home Medications Prior to Admission medications   Medication Sig Start Date End Date Taking? Authorizing Provider  allopurinol (ZYLOPRIM) 100 MG tablet Take 100 mg by mouth daily.   Yes [provider]  amiodarone (PACERONE) 200 MG tablet Take 1 tablet (200 mg total) by mouth daily. 12/08/20  Yes Amponsah, Charisse March, MD  apixaban (ELIQUIS) 2.5 MG TABS tablet Take 2.5 mg by mouth 2 (two) times daily.   Yes [provider]  atorvastatin (LIPITOR) 20 MG tablet Take 20 mg by mouth every evening.   Yes [provider]  colchicine 0.6 MG tablet Take 0.6 mg by mouth daily.   Yes [provider]  folic acid (FOLVITE) 1 MG tablet Take 1 mg by mouth in the morning.   Yes [provider]  furosemide (LASIX) 20 MG tablet Take 1 tablet (20 mg total) by mouth daily as needed (weight gain of 3 lbs overnight or 5 lbs in 1  week). Patient taking differently: Take 40 mg by mouth daily. 03/12/21  Yes Donato Heinz, MD  magnesium oxide (MAG-OX) 400 MG tablet Take 1 tablet (400 mg total) by mouth daily. 03/31/21  Yes Donato Heinz, MD  metoprolol succinate (TOPROL-XL) 100 MG 24 hr tablet Take 0.5 tablets (50 mg total) by mouth daily. Take with or immediately following a meal. 01/09/21  Yes Cleaver, Jossie Ng, NP  pantoprazole (PROTONIX) 40 MG tablet Take 40 mg by mouth daily before breakfast.   Yes [provider]  Potassium Chloride ER 20 MEQ TBCR Take 20 mEq by mouth as needed (with lasix when taken). Patient taking differently: Take 20 mEq by mouth  daily. 03/12/21  Yes Donato Heinz, MD  thiamine (VITAMIN B-1) 100 MG tablet Take 100 mg by mouth daily.   Yes [provider]  dapagliflozin propanediol (FARXIGA) 5 MG TABS tablet Take 1 tablet (5 mg total) by mouth daily. Patient not taking: Reported on 05/19/2021 12/09/20   Lacinda Axon, MD  sacubitril-valsartan (ENTRESTO) 49-51 MG Take 1 tablet by mouth 2 (two) times daily. Patient not taking: Reported on 05/19/2021 03/19/21   Donato Heinz, MD  spironolactone (ALDACTONE) 25 MG tablet Take 0.5 tablets (12.5 mg total) by mouth daily. ON HOLD AS OF 3/11 per Dr. Gardiner Rhyme, see phone note Patient not taking: Reported on 05/19/2021 01/02/21   Donato Heinz, MD    Allergies    Patient has no known allergies.  Review of Systems   Review of Systems  Constitutional:  Negative for fever.  HENT:  Negative for ear pain.   Eyes:  Negative for pain.  Respiratory:  Positive for shortness of breath. Negative for cough.   Cardiovascular:  Negative for chest pain.  Gastrointestinal:  Positive for abdominal pain.  Genitourinary:  Negative for flank pain.  Musculoskeletal:  Negative for back pain.  Skin:  Negative for rash.  Neurological:  Negative for headaches.   Physical Exam Updated Vital Signs BP (!) 187/118   Pulse 79   Temp 98 F (36.7 C) (Oral)   Resp (!) 22   SpO2 92%   Physical Exam Constitutional:      General: She is not in acute distress.    Appearance: Normal appearance.  HENT:     Head: Normocephalic.     Nose: Nose normal.  Eyes:     Extraocular Movements: Extraocular movements intact.  Cardiovascular:     Rate and Rhythm: Normal rate.  Pulmonary:     Effort: Pulmonary effort is normal.  Abdominal:     Tenderness: There is abdominal tenderness.     Comments: Mild to moderate abdominal distention is present.  Some tenderness noted in the epigastric region.  No guarding or rebound noted.  Musculoskeletal:        General: Normal  range of motion.     Cervical back: Normal range of motion.  Neurological:     General: No focal deficit present.     Mental Status: She is alert. Mental status is at baseline.    ED Results / Procedures / Treatments   Labs (all labs ordered are listed, but only abnormal results are displayed) Labs Reviewed  CBC WITH DIFFERENTIAL/PLATELET - Abnormal; Notable for the following components:      Result Value   RBC 3.70 (*)    Hemoglobin 11.2 (*)    MCV 100.3 (*)    RDW 17.2 (*)    nRBC 0.6 (*)    All  other components within normal limits  COMPREHENSIVE METABOLIC PANEL - Abnormal; Notable for the following components:   Chloride 116 (*)    CO2 20 (*)    Glucose, Bld 110 (*)    BUN 24 (*)    Creatinine, Ser 1.39 (*)    Total Protein 6.4 (*)    Albumin 3.3 (*)    GFR, Estimated 38 (*)    All other components within normal limits  LIPASE, BLOOD - Abnormal; Notable for the following components:   Lipase 53 (*)    All other components within normal limits  URINALYSIS, ROUTINE W REFLEX MICROSCOPIC - Abnormal; Notable for the following components:   APPearance HAZY (*)    Hgb urine dipstick SMALL (*)    Protein, ur 100 (*)    All other components within normal limits  BRAIN NATRIURETIC PEPTIDE - Abnormal; Notable for the following components:   B Natriuretic Peptide 2,171.9 (*)    All other components within normal limits  TROPONIN I (HIGH SENSITIVITY)  TROPONIN I (HIGH SENSITIVITY)    EKG None  Radiology CT ABDOMEN PELVIS W CONTRAST  Result Date: 05/19/2021 CLINICAL DATA:  Abdominal pain and history left-sided colostomy. EXAM: CT ABDOMEN AND PELVIS WITH CONTRAST TECHNIQUE: Multidetector CT imaging of the abdomen and pelvis was performed using the standard protocol following bolus administration of intravenous contrast. CONTRAST:  182m OMNIPAQUE IOHEXOL 300 MG/ML  SOLN COMPARISON:  12/05/2020 FINDINGS: Lower chest: Stable to slightly smaller right pleural effusion. No  left-sided pleural fluid. Cardiac enlargement especially involving right-sided chambers with prominent reflux of contrast into the IVC and hepatic veins consistent with right heart failure. Hepatobiliary: No focal liver abnormality is seen. Status post cholecystectomy. No biliary dilatation. Pancreas: Stable dilatation of the pancreatic duct without identifiable pancreatic mass or evidence of pancreatitis. Spleen: Normal in size without focal abnormality. Adrenals/Urinary Tract: Previously noted right adrenal nodule less evident. Kidneys demonstrate no renal calculi, focal lesion, or hydronephrosis. Stable rim calcified cyst in the lower pole of the right kidney. Bladder is unremarkable. Stomach/Bowel: Despite underdistention, there is suggestion of new gastric wall thickening throughout the stomach with some associated free fluid posterior to the fundus and body of the stomach. Underlying gastritis/peptic disease is suspected. No evidence of free air to suggest overt perforated ulcer. Other bowel appears relatively stable with no evidence of overt obstruction. 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 Vascular/Lymphatic: Stable atherosclerosis of the abdominal aorta without evidence of aneurysm. Reproductive: Stable uterine calcification consistent with degenerative fibroid. No adnexal masses. Other: No ascites or focal abscess identified. Musculoskeletal: Stable degenerative disc disease throughout the lumbar spine. IMPRESSION: 1. New gastric wall thickening throughout the stomach with associated free fluid posterior to the stomach. Findings suggest acute gastritis/peptic disease. No evidence of free intraperitoneal air to suggest overt perforated ulcer. 2. Chronic dilated pancreatic duct. 3. Less evident right adrenal nodule compared to prior studies. Electronically Signed   By: GAletta EdouardM.D.   On: 05/19/2021 12:39   DG Chest Port 1 View  Result Date:  05/19/2021 CLINICAL DATA:  80year old female with shortness of breath onset today. EXAM: PORTABLE CHEST 1 VIEW COMPARISON:  Chest radiographs 05/06/2021 and earlier. FINDINGS: Portable AP semi upright view at 1051 hours. Stable cardiomegaly and mediastinal contours. Calcified aortic atherosclerosis. Visualized tracheal air column is within normal limits. No acute pulmonary edema. Small bilateral pleural effusions do not appear significantly changed from earlier this month. No new pulmonary opacity. No acute osseous abnormality  identified. IMPRESSION: Stable cardiomegaly and small pleural effusions. No overt edema or new cardiopulmonary abnormality. Aortic Atherosclerosis (ICD10-I70.0). Electronically Signed   By: Genevie Ann M.D.   On: 05/19/2021 11:25    Procedures Procedures   Medications Ordered in ED Medications  furosemide (LASIX) injection 60 mg (60 mg Intravenous Given 05/19/21 1220)  iohexol (OMNIPAQUE) 300 MG/ML solution 100 mL (100 mLs Intravenous Contrast Given 05/19/21 1202)    ED Course  I have reviewed the triage vital signs and the nursing notes.  Pertinent labs & imaging results that were available during my care of the patient were reviewed by me and considered in my medical decision making (see chart for details).    MDM Rules/Calculators/A&P                           CT imaging shows inflammatory changes around the gastrum concerning for maybe gastritis but no other acute findings noted no evidence of obstruction or other lesion noted per radiology.  Labs show elevated proBNP of 2000.  She also presented with shortness of breath and some clipped speech and tachypnea.  Work-up was concerning for CHF exacerbation.  She has 1+ pitting edema bilateral lower extremities and elevated proBNP.  Patient given IV Lasix 60 mg with some improvement of speech.  I discussed outpatient management and increasing her Lasix dosages at home, however family and patient prefer to stay for  observation given her worsening shortness of breath.  Final Clinical Impression(s) / ED Diagnoses Final diagnoses:  Acute on chronic congestive heart failure, unspecified heart failure type [facility]    Rx / DC Orders ED Discharge Orders     None        Luna Fuse, MD 05/19/21 [phone]

## 2021-05-19 NOTE — Progress Notes (Signed)
Pt admitted to 3e01 in NAD on RA. Pt A/O x2. VSS. Falls precautions in place.

## 2021-05-19 NOTE — ED Notes (Signed)
Placed the on the monitor got patient some blankets patient is resting with family at bedside and call bell in reach

## 2021-05-19 NOTE — H&P (Addendum)
History and Physical    Maria Travis [id number] DOB: 10/05/1941 DOA: 05/18/2021  PCP: Seward Carol, MD Consultants:  Gardiner Rhyme - cardiology;  Patient coming from:  [facility]; NOK: Daughter, Murlean Iba, [phone]  Chief Complaint: Edema  HPI: Maria Travis is a 80 y.o. 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  She has been having trouble with her legs and her breathing.  It seemed like she just couldn't get herself together.  Symptoms for 3-4 months.  +cough, productive of phlegm that varies in color based on what she ate.   No fever.  She was seen by Dr. Gardiner Rhyme on 5/19 and changed to Lasix 20 mg prn.  Her BP was consistently elevated and Entresto was increased on 5/26.      ED Course: Has colostomy, abdominal distention and TTP, CT unremarkable.  Also with SOB, vital ok, RR 20, 100%.  Started on Lasix 1-2 weeks ago, then doubled to 40.  BNP 2000, given Lasix 60 mg.  Review of Systems: As per HPI; otherwise review of systems reviewed and negative.  Limited by ?cognitive impairment, no family present.  Ambulatory Status:  Ambulates with a walker or wheelchair  COVID Vaccine Status:  Complete plus 2 boosters  Past Medical History:  Diagnosis Date   Atrial fibrillation (HCC)    Chronic diastolic (congestive) heart failure (HCC)    Diverticulitis    High cholesterol    Hypertension     Past Surgical History:  Procedure Laterality Date   CARDIOVERSION N/A 12/05/2020   Procedure: CARDIOVERSION;  Surgeon: Sanda Klein, MD;  Location: [facility];  Service: Cardiovascular;  Laterality: N/A;   CHOLECYSTECTOMY     COLOSTOMY     RIGHT/LEFT HEART CATH AND CORONARY ANGIOGRAPHY N/A 12/04/2020   Procedure: RIGHT/LEFT HEART CATH AND CORONARY ANGIOGRAPHY;  Surgeon: Belva Crome, MD;  Location: [facility];  Service: Cardiovascular;  Laterality: N/A;   TEE WITHOUT CARDIOVERSION  N/A 12/05/2020   Procedure: TRANSESOPHAGEAL ECHOCARDIOGRAM (TEE);  Surgeon: Sanda Klein, MD;  Location: [facility]OSCOPY;  Service: Cardiovascular;  Laterality: N/A;    Social History   Socioeconomic History   Marital status: Single    Spouse name: Not on file   Number of children: Not on file   Years of education: Not on file   Highest education level: Not on file  Occupational History   Occupation: retired  Tobacco Use   Smoking status: Never   Smokeless tobacco: Never  Vaping Use   Vaping Use: Never used  Substance and Sexual Activity   Alcohol use: Not Currently    Comment: weekends   Drug use: No   Sexual activity: Not on file  Other Topics Concern   Not on file  Social History Narrative   Not on file   Social Determinants of Health   Financial Resource Strain: Not on file  Food Insecurity: Not on file  Transportation Needs: Not on file  Physical Activity: Not on file  Stress: Not on file  Social Connections: Not on file  Intimate Partner Violence: Not on file    No Known Allergies  History reviewed. No pertinent family history.  Prior to Admission medications   Medication Sig Start Date End Date Taking? Authorizing Provider  allopurinol (ZYLOPRIM) 100 MG tablet Take 100 mg by mouth daily.   Yes [provider]  amiodarone (PACERONE) 200 MG tablet Take 1 tablet (200 mg total) by mouth daily.  12/08/20  Yes Amponsah, Charisse March, MD  apixaban (ELIQUIS) 2.5 MG TABS tablet Take 2.5 mg by mouth 2 (two) times daily.   Yes [provider]  atorvastatin (LIPITOR) 20 MG tablet Take 20 mg by mouth every evening.   Yes [provider]  colchicine 0.6 MG tablet Take 0.6 mg by mouth daily.   Yes [provider]  folic acid (FOLVITE) 1 MG tablet Take 1 mg by mouth in the morning.   Yes [provider]  furosemide (LASIX) 20 MG tablet Take 1 tablet (20 mg total) by mouth daily as needed (weight gain of 3 lbs overnight or 5 lbs in 1  week). Patient taking differently: Take 40 mg by mouth daily. 03/12/21  Yes Donato Heinz, MD  magnesium oxide (MAG-OX) 400 MG tablet Take 1 tablet (400 mg total) by mouth daily. 03/31/21  Yes Donato Heinz, MD  metoprolol succinate (TOPROL-XL) 100 MG 24 hr tablet Take 0.5 tablets (50 mg total) by mouth daily. Take with or immediately following a meal. 01/09/21  Yes Cleaver, Jossie Ng, NP  pantoprazole (PROTONIX) 40 MG tablet Take 40 mg by mouth daily before breakfast.   Yes [provider]  Potassium Chloride ER 20 MEQ TBCR Take 20 mEq by mouth as needed (with lasix when taken). Patient taking differently: Take 20 mEq by mouth daily. 03/12/21  Yes Donato Heinz, MD  thiamine (VITAMIN B-1) 100 MG tablet Take 100 mg by mouth daily.   Yes [provider]  dapagliflozin propanediol (FARXIGA) 5 MG TABS tablet Take 1 tablet (5 mg total) by mouth daily. Patient not taking: Reported on 05/19/2021 12/09/20   Lacinda Axon, MD  sacubitril-valsartan (ENTRESTO) 49-51 MG Take 1 tablet by mouth 2 (two) times daily. Patient not taking: Reported on 05/19/2021 03/19/21   Donato Heinz, MD  spironolactone (ALDACTONE) 25 MG tablet Take 0.5 tablets (12.5 mg total) by mouth daily. ON HOLD AS OF 3/11 per Dr. Gardiner Rhyme, see phone note Patient not taking: Reported on 05/19/2021 01/02/21   Donato Heinz, MD    Physical Exam: Vitals:   05/19/21 1645 05/19/21 1700 05/19/21 1730 05/19/21 1800  BP: (!) 186/101 (!) 180/98 (!) 179/128 (!) 185/104  Pulse: 76 70 87 73  Resp: (!) 24 13 (!) 22 20  Temp:      TempSrc:      SpO2: 95% 97% 99% 94%     General:  Appears calm and comfortable and is in NAD Eyes:  PERRL, EOMI, normal lids, iris ENT:  grossly normal hearing, lips & tongue, mmm Neck:  no LAD, masses or thyromegaly Cardiovascular:  RRR, no m/r/g. 2-3+ LE edema.  Respiratory:   CTA bilaterally with no wheezes/rales/rhonchi.  Normal to mildly increased  respiratory effort. Abdomen:  soft, NT, ND; ostomy in umbilical region with soft brown stool noted Skin:  no rash or induration seen on limited exam Musculoskeletal:  grossly normal tone BUE/BLE, good ROM, no bony abnormality Psychiatric:  blunted mood and affect, speech fluent and appropriate, AOx2 Neurologic:  CN 2-12 grossly intact, moves all extremities in coordinated fashion    Radiological Exams on Admission: Independently reviewed - see discussion in A/P where applicable  CT ABDOMEN PELVIS W CONTRAST  Result Date: 05/19/2021 CLINICAL DATA:  Abdominal pain and history left-sided colostomy. EXAM: CT ABDOMEN AND PELVIS WITH CONTRAST TECHNIQUE: Multidetector CT imaging of the abdomen and pelvis was performed using the standard protocol following bolus administration of intravenous contrast. CONTRAST:  113m  OMNIPAQUE IOHEXOL 300 MG/ML  SOLN COMPARISON:  12/05/2020 FINDINGS: Lower chest: Stable to slightly smaller right pleural effusion. No left-sided pleural fluid. Cardiac enlargement especially involving right-sided chambers with prominent reflux of contrast into the IVC and hepatic veins consistent with right heart failure. Hepatobiliary: No focal liver abnormality is seen. Status post cholecystectomy. No biliary dilatation. Pancreas: Stable dilatation of the pancreatic duct without identifiable pancreatic mass or evidence of pancreatitis. Spleen: Normal in size without focal abnormality. Adrenals/Urinary Tract: Previously noted right adrenal nodule less evident. Kidneys demonstrate no renal calculi, focal lesion, or hydronephrosis. Stable rim calcified cyst in the lower pole of the right kidney. Bladder is unremarkable. Stomach/Bowel: Despite underdistention, there is suggestion of new gastric wall thickening throughout the stomach with some associated free fluid posterior to the fundus and body of the stomach. Underlying gastritis/peptic disease is suspected. No evidence of free air to suggest  overt perforated ulcer. Other bowel appears relatively stable with no evidence of overt obstruction. 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 Vascular/Lymphatic: Stable atherosclerosis of the abdominal aorta without evidence of aneurysm. Reproductive: Stable uterine calcification consistent with degenerative fibroid. No adnexal masses. Other: No ascites or focal abscess identified. Musculoskeletal: Stable degenerative disc disease throughout the lumbar spine. IMPRESSION: 1. New gastric wall thickening throughout the stomach with associated free fluid posterior to the stomach. Findings suggest acute gastritis/peptic disease. No evidence of free intraperitoneal air to suggest overt perforated ulcer. 2. Chronic dilated pancreatic duct. 3. Less evident right adrenal nodule compared to prior studies. Electronically Signed   By: Aletta Edouard M.D.   On: 05/19/2021 12:39   DG Chest Port 1 View  Result Date: 05/19/2021 CLINICAL DATA:  80 year old female with shortness of breath onset today. EXAM: PORTABLE CHEST 1 VIEW COMPARISON:  Chest radiographs 05/06/2021 and earlier. FINDINGS: Portable AP semi upright view at 1051 hours. Stable cardiomegaly and mediastinal contours. Calcified aortic atherosclerosis. Visualized tracheal air column is within normal limits. No acute pulmonary edema. Small bilateral pleural effusions do not appear significantly changed from earlier this month. No new pulmonary opacity. No acute osseous abnormality identified. IMPRESSION: Stable cardiomegaly and small pleural effusions. No overt edema or new cardiopulmonary abnormality. Aortic Atherosclerosis (ICD10-I70.0). Electronically Signed   By: Genevie Ann M.D.   On: 05/19/2021 11:25    EKG: Independently reviewed.  NSR with rate 74; nonspecific ST changes with no evidence of acute ischemia   Labs on Admission: I have personally reviewed the available labs and imaging studies at the time of the  admission.  Pertinent labs:   Glucose 110 BUN 24/Creatinine 1.39/GFR 38; 12/0.95/60 on 03/27/21 BNP 2171.9; 768.8 on 12/02/20 HS troponin 10 WBC 6.2 Hgb 11.2 UA: small Hgb, 100 protein   Assessment/Plan Principal Problem:   Acute on chronic diastolic CHF (congestive heart failure) (HCC) Active Problems:   Paroxysmal atrial fibrillation (HCC)   Hypertension   Hyperlipidemia   Gastritis    Mild acute on chronic diastolic CHF -Patient with known h/o chronic diastolic CHF presenting with worsening edema despite increasing Lasix dosing -Echo in 01/2021 with preserved EF and grade 2 diastolic dysfunction -CXR without pulmonary edema -Elevated BNP compared to prior -She appears to have volume overload associated with prn Lasix dosing and is likely to benefit from resumption of daily Lasix dosing -Will place in observation status with telemetry, as there are no current findings necessitating admission (hemodynamic instability, severe electrolyte abnormalities, cardiac arrhythmias, ACS, severe pulmonary edema requiring new O2 therapy, AMS) -Was  given Lasix 60 mg x 1 in ER and will repeat with 40 mg IV BID -Has not needed O2 so far -LE edema may also be related to sedentary lifestyle and poor dietary choices -Per Dr. Newman Nickels last note, [name] was supposed to be increased; instead, it is not listed on the med rec -Will hold given renal dysfunction (mild) now, but possibly resume tomorrow  Gastritis -Patient with reported c/o abdominal pain -Has ostomy for ?reasons - patient is unable to explain why -CT today with possible gastritis -She takes Protonix daily; will increase to IV BID and also add Carafate -Likely needs BID Protonix for 4-8 weeks -Outpatient GI f/u unless new issues arise  HTN -Poor control while in the ER -Possibly related to not taking Entresto and/or volume overload -Resume Toprol -Will also add prn hydralazine  HLD -Continue Lipitor  Afib -Rate controlled  with Amiodarone, Toprol XL -Continue Eliquis  ?Dementia -Patient with ? Cognitive impairment -Suggest ongoing discussion with family, outpatient PCP evaluation   Note: This patient has been tested and is pending for the novel coronavirus COVID-19. She has been fully vaccinated against COVID-19.   DVT prophylaxis: Eliquis Code Status:  Full- confirmed with patient but she did not appear to understand the question Family Communication: None present; I was unable to reach her daughter by telephone at the time of the admission Disposition Plan:  The patient is from: ILF  Anticipated d/c is to: ILF, possibly with [facility] services  Anticipated d/c date will depend on clinical response to treatment, possibly as early as tomorrow  Patient is currently: acutely ill Consults called: Cardiology; [facility] team; PT/OT; Nutrition Admission status: It is my clinical opinion that referral for OBSERVATION is reasonable and necessary in this patient based on the above information provided. The aforementioned taken together are felt to place the patient at high risk for further clinical deterioration. However it is anticipated that the patient may be medically stable for discharge from the hospital within 24 to 48 hours.     Karmen Bongo MD [facility]   How to contact the [facility] Attending or Consulting provider [place] or covering provider during after hours [place], for this patient?  Check the care team in [facility] and look for a) attending/consulting [facility] provider listed and b) the [facility] team listed Log into [email or web address] and use [place]'s universal password to access. If you do not have the password, please contact the hospital operator. Locate the [facility] provider you are looking for under [facility] and page to a number that you can be directly reached. If you still have difficulty reaching the provider, please page the [facility] (Director on Call) for the Hospitalists listed on amion for  assistance.   05/19/2021, 6:27 PM

## 2021-05-20 DIAGNOSIS — R140 Abdominal distension (gaseous): Secondary | ICD-10-CM | POA: Diagnosis present

## 2021-05-20 DIAGNOSIS — E7800 Pure hypercholesterolemia, unspecified: Secondary | ICD-10-CM | POA: Diagnosis present

## 2021-05-20 DIAGNOSIS — Z79899 Other long term (current) drug therapy: Secondary | ICD-10-CM | POA: Diagnosis not present

## 2021-05-20 DIAGNOSIS — Z9049 Acquired absence of other specified parts of digestive tract: Secondary | ICD-10-CM | POA: Diagnosis not present

## 2021-05-20 DIAGNOSIS — I5033 Acute on chronic diastolic (congestive) heart failure: Secondary | ICD-10-CM | POA: Diagnosis present

## 2021-05-20 DIAGNOSIS — R4189 Other symptoms and signs involving cognitive functions and awareness: Secondary | ICD-10-CM | POA: Diagnosis present

## 2021-05-20 DIAGNOSIS — Z7901 Long term (current) use of anticoagulants: Secondary | ICD-10-CM | POA: Diagnosis not present

## 2021-05-20 DIAGNOSIS — E876 Hypokalemia: Secondary | ICD-10-CM | POA: Diagnosis present

## 2021-05-20 DIAGNOSIS — E785 Hyperlipidemia, unspecified: Secondary | ICD-10-CM | POA: Diagnosis present

## 2021-05-20 DIAGNOSIS — Z20822 Contact with and (suspected) exposure to covid-19: Secondary | ICD-10-CM | POA: Diagnosis present

## 2021-05-20 DIAGNOSIS — K5792 Diverticulitis of intestine, part unspecified, without perforation or abscess without bleeding: Secondary | ICD-10-CM | POA: Diagnosis present

## 2021-05-20 DIAGNOSIS — Z933 Colostomy status: Secondary | ICD-10-CM | POA: Diagnosis not present

## 2021-05-20 DIAGNOSIS — I110 Hypertensive heart disease with heart failure: Secondary | ICD-10-CM | POA: Diagnosis present

## 2021-05-20 DIAGNOSIS — K2970 Gastritis, unspecified, without bleeding: Secondary | ICD-10-CM | POA: Diagnosis present

## 2021-05-20 DIAGNOSIS — I480 Paroxysmal atrial fibrillation: Secondary | ICD-10-CM | POA: Diagnosis present

## 2021-05-20 DIAGNOSIS — F0390 Unspecified dementia without behavioral disturbance: Secondary | ICD-10-CM | POA: Diagnosis present

## 2021-05-20 LAB — CBC
HCT: 40.1 % (ref 36.0–46.0)
Hemoglobin: 12.1 g/dL (ref 12.0–15.0)
MCH: 30.4 pg (ref 26.0–34.0)
MCHC: 30.2 g/dL (ref 30.0–36.0)
MCV: 100.8 fL — ABNORMAL HIGH (ref 80.0–100.0)
Platelets: 169 10*3/uL (ref 150–400)
RBC: 3.98 MIL/uL (ref 3.87–5.11)
RDW: 17.1 % — ABNORMAL HIGH (ref 11.5–15.5)
WBC: 5.5 10*3/uL (ref 4.0–10.5)
nRBC: 0.4 % — ABNORMAL HIGH (ref 0.0–0.2)

## 2021-05-20 LAB — TROPONIN I (HIGH SENSITIVITY): Troponin I (High Sensitivity): 13 ng/L (ref <18)

## 2021-05-20 LAB — BASIC METABOLIC PANEL
Anion gap: 12 (ref 5–15)
BUN: 21 mg/dL (ref 8–23)
CO2: 21 mmol/L — ABNORMAL LOW (ref 22–32)
Calcium: 9.3 mg/dL (ref 8.9–10.3)
Chloride: 108 mmol/L (ref 98–111)
Creatinine, Ser: 1.26 mg/dL — ABNORMAL HIGH (ref 0.44–1.00)
GFR, Estimated: 43 mL/min — ABNORMAL LOW (ref >60)
Glucose, Bld: 94 mg/dL (ref 70–99)
Potassium: 3.1 mmol/L — ABNORMAL LOW (ref 3.5–5.1)
Sodium: 141 mmol/L (ref 135–145)

## 2021-05-20 LAB — SARS CORONAVIRUS 2 (TAT 6-24 HRS): SARS Coronavirus 2: NEGATIVE

## 2021-05-20 MED ORDER — PANTOPRAZOLE SODIUM 40 MG PO TBEC
40.0000 mg | DELAYED_RELEASE_TABLET | Freq: Two times a day (BID) | ORAL | Status: DC
  Administered 2021-05-20 – 2021-05-21 (×3): 40 mg via ORAL
  Filled 2021-05-20 (×3): qty 1

## 2021-05-20 MED ORDER — POTASSIUM CHLORIDE CRYS ER 20 MEQ PO TBCR
40.0000 meq | EXTENDED_RELEASE_TABLET | Freq: Two times a day (BID) | ORAL | Status: AC
  Administered 2021-05-20 (×2): 40 meq via ORAL
  Filled 2021-05-20 (×2): qty 2

## 2021-05-20 MED ORDER — ENSURE ENLIVE PO LIQD
237.0000 mL | Freq: Two times a day (BID) | ORAL | Status: DC
  Administered 2021-05-21: 237 mL via ORAL

## 2021-05-20 MED ORDER — ADULT MULTIVITAMIN W/MINERALS CH
1.0000 | ORAL_TABLET | Freq: Every day | ORAL | Status: DC
  Administered 2021-05-20 – 2021-05-21 (×2): 1 via ORAL
  Filled 2021-05-20 (×2): qty 1

## 2021-05-20 NOTE — Plan of Care (Signed)

## 2021-05-20 NOTE — Evaluation (Signed)
Physical Therapy Evaluation Patient Details Name: Maria Travis MRN: [id number] DOB: Sep 28, 1941 Today's Date: 05/20/2021   History of Present Illness  80 y.o. female presents to [facility] ED on 05/18/2021 with LE weakness/edema and DOE. PMH includes afib; HTN; colostomy; and HLD.  Clinical Impression  Pt presents to PT with deficits in cognition, balance, power, endurance, functional mobility, and gait. Pt without family present during session but does present with memory deficits throughout session. Pt demonstrates generalized weakness and reports LE soreness which limit her tolerance for mobility. Pt will benefit from continued acute PT POC to improve activity tolerance and reduce falls risk. PT recommends HHPT upon return to ALF.    Follow Up Recommendations Home health PT;Supervision - Intermittent (upon return to ALF)    Equipment Recommendations  None recommended by PT    Recommendations for Other Services       Precautions / Restrictions Precautions Precautions: Fall Restrictions Weight Bearing Restrictions: No      Mobility  Bed Mobility Overal bed mobility: Modified Independent             General bed mobility comments: pt received and left in recliner    Transfers Overall transfer level: Needs assistance Equipment used: 4-wheeled walker Transfers: Sit to/from Stand Sit to Stand: Min guard Stand pivot transfers: Min guard       General transfer comment: holds onto chair arm for stability, min G for safety as patient lets go of walker and did not use  Ambulation/Gait Ambulation/Gait assistance: Supervision Gait Distance (Feet): 150 Feet Assistive device: 4-wheeled walker Gait Pattern/deviations: Step-through pattern Gait velocity: functional Gait velocity interpretation: 1.31 - 2.62 ft/sec, indicative of limited community ambulator General Gait Details: pt with slowed step-through gait, 2 very brief standing rest breaks  Stairs            Wheelchair  Mobility    Modified Rankin (Stroke Patients Only)       Balance Overall balance assessment: Mild deficits observed, not formally tested                                           Pertinent Vitals/Pain Pain Assessment: No/denies pain    Home Living Family/patient expects to be discharged to:: Assisted living               Home Equipment: Walker - 2 wheels;Wheelchair - manual Additional Comments: pt is unable to recall name of her ALF, reports moving into ALF recently    Prior Function Level of Independence: Independent with assistive device(s)         Comments: pt reports ambulating with rollator for household distances, staff push patient in wheelchair for community distances     Hand Dominance   Dominant Hand: Right    Extremity/Trunk Assessment   Upper Extremity Assessment Upper Extremity Assessment: Overall WFL for tasks assessed    Lower Extremity Assessment Lower Extremity Assessment: Generalized weakness    Cervical / Trunk Assessment Cervical / Trunk Assessment: Normal  Communication   Communication: No difficulties  Cognition Arousal/Alertness: Awake/alert Behavior During Therapy: WFL for tasks assessed/performed Overall Cognitive Status: No family/caregiver present to determine baseline cognitive functioning                                 General Comments: pt is alert and oriented to person/place/time. Pt  with some short term memory deficits at this time, unable to recall ALF name and at times forgets room number during session after being instructed by PT during session      General Comments General comments (skin integrity, edema, etc.): VSS on RA    Exercises     Assessment/Plan    PT Assessment Patient needs continued PT services  PT Problem List Decreased strength;Decreased activity tolerance;Decreased balance;Decreased mobility;Decreased cognition;Cardiopulmonary status limiting activity        PT Treatment Interventions DME instruction;Gait training;Functional mobility training;Therapeutic activities;Therapeutic exercise;Balance training;Cognitive remediation;Patient/family education    PT Goals (Current goals can be found in the Care Plan section)  Acute Rehab PT Goals Patient Stated Goal: to return to ALF PT Goal Formulation: With patient Time For Goal Achievement: 06/03/21 Potential to Achieve Goals: Good    Frequency Min 3X/week   Barriers to discharge        Co-evaluation               AM-PAC PT "6 Clicks" Mobility  Outcome Measure Help needed turning from your back to your side while in a flat bed without using bedrails?: A Little Help needed moving from lying on your back to sitting on the side of a flat bed without using bedrails?: A Little Help needed moving to and from a bed to a chair (including a wheelchair)?: A Little Help needed standing up from a chair using your arms (e.g., wheelchair or bedside chair)?: A Little Help needed to walk in hospital room?: A Little Help needed climbing 3-5 steps with a railing? : A Lot 6 Click Score: 17    End of Session   Activity Tolerance: Patient tolerated treatment well Patient left: in chair;with call bell/phone within reach;with chair alarm set Nurse Communication: Mobility status PT Visit Diagnosis: Other abnormalities of gait and mobility (R26.89);Muscle weakness (generalized) (M62.81)    Time: [id number] PT Time Calculation (min) (ACUTE ONLY): 30 min   Charges:   PT Evaluation $PT Eval Low Complexity: [place], PT, DPT Acute Rehabilitation Pager: [phone]   Zenaida Niece 05/20/2021, 10:30 AM

## 2021-05-20 NOTE — Evaluation (Signed)
Occupational Therapy Evaluation Patient Details Name: Maria Travis MRN: [id number] DOB: 1941/03/31 Today's Date: 05/20/2021    History of Present Illness Patient is an 80 year old female admitted with c/o shortness of breath and diffuse abbominal pain. Pt admitted with B LE edema and concerns CHF. PMH includes A-fib, HTN   Clinical Impression   Patient reports recently moving into ALF, cannot recall name "[facility] [place] something." Reports she is independent with basic self care tasks, uses her walker in her room and wheelchair for longer/community distances. Would need to confirm info with staff as patient is A/O x3 however displays memory deficits asking OT when I brought the breakfast in, needing max education regarding call light system, ect. Patient overall set up for UB ADLs, min G for LB ADLs and for functional transfer to chair for safety. Provided patient with walker however was able to hold onto chair arm and pivot without walker. Do not anticipate further OT follow up at D/C, will follow acutely to facilitate D/C to venue listed below.    Follow Up Recommendations  Other (comment) (return to ALF)    Equipment Recommendations  None recommended by OT       Precautions / Restrictions Precautions Precautions: Fall Restrictions Weight Bearing Restrictions: No      Mobility Bed Mobility Overal bed mobility: Modified Independent             General bed mobility comments: HOB elevated    Transfers Overall transfer level: Needs assistance Equipment used: None Transfers: Stand Pivot Transfers   Stand pivot transfers: Min guard       General transfer comment: holds onto chair arm for stability, min G for safety as patient lets go of walker and did not use    Balance Overall balance assessment: Mild deficits observed, not formally tested                                         ADL either performed or assessed with clinical judgement   ADL  Overall ADL's : Needs assistance/impaired Eating/Feeding: Independent;Sitting   Grooming: Set up;Sitting   Upper Body Bathing: Set up;Sitting   Lower Body Bathing: Min guard;Sit to/from stand;Sitting/lateral leans   Upper Body Dressing : Set up;Sitting   Lower Body Dressing: Set up;Min guard;Sitting/lateral leans;Sit to/from stand Lower Body Dressing Details (indicate cue type and reason): seated in chair patient is able to pull up socks, min G in standing for safety Toilet Transfer: Min Designer, jewellery Details (indicate cue type and reason): provided patient with walker however does not use for stand pivot to recliner, min G for safety Toileting- Clothing Manipulation and Hygiene: Min guard;Sit to/from stand;Sitting/lateral lean       Functional mobility during ADLs: Min guard;Cueing for safety        Pertinent Vitals/Pain Pain Assessment: No/denies pain     Hand Dominance Right   Extremity/Trunk Assessment Upper Extremity Assessment Upper Extremity Assessment: Overall WFL for tasks assessed   Lower Extremity Assessment Lower Extremity Assessment: Defer to PT evaluation       Communication Communication Communication: No difficulties   Cognition Arousal/Alertness: Awake/alert Behavior During Therapy: WFL for tasks assessed/performed Overall Cognitive Status: No family/caregiver present to determine baseline cognitive functioning  General Comments: patient knows in hospital cannot recall name, oriented to month/year/day of week. memory deficts with patient not understanding call light system, asking OT if I brought the breakfast in this morning.              Home Living Family/patient expects to be discharged to:: Assisted living                             Home Equipment: Gilford Rile - 2 wheels;Wheelchair - manual          Prior Functioning/Environment Level of Independence:  Independent with assistive device(s)        Comments: per patient she can perform basic ADLs in her apartment, uses wheelchair for extended distances and walker in her room. may need to confirm with staff due to memory deficits        OT Problem List: Decreased activity tolerance;Impaired balance (sitting and/or standing);Decreased safety awareness;Decreased cognition;Decreased knowledge of use of DME or AE      OT Treatment/Interventions: Self-care/ADL training;Balance training;Patient/family education;Therapeutic activities;Cognitive remediation/compensation    OT Goals(Current goals can be found in the care plan section) Acute Rehab OT Goals Patient Stated Goal: eat breakfast OT Goal Formulation: With patient Time For Goal Achievement: 06/03/21 Potential to Achieve Goals: Good  OT Frequency: Min 2X/week       AM-PAC OT "6 Clicks" Daily Activity     Outcome Measure Help from another person eating meals?: None Help from another person taking care of personal grooming?: A Little Help from another person toileting, which includes using toliet, bedpan, or urinal?: A Little Help from another person bathing (including washing, rinsing, drying)?: A Little Help from another person to put on and taking off regular upper body clothing?: A Little Help from another person to put on and taking off regular lower body clothing?: A Little 6 Click Score: 19   End of Session Nurse Communication: Mobility status  Activity Tolerance: Patient tolerated treatment well Patient left: in chair;with call bell/phone within reach;with chair alarm set  OT Visit Diagnosis: Other abnormalities of gait and mobility (R26.89);Other symptoms and signs involving cognitive function                Time: 0720-0740 OT Time Calculation (min): 20 min Charges:  OT General Charges $OT Visit: 1 Visit OT Evaluation $OT Eval Low Complexity: [place] OT OT pager: [phone]  Rosemary Holms 05/20/2021,  8:57 AM

## 2021-05-20 NOTE — Progress Notes (Signed)
Initial Nutrition Assessment  DOCUMENTATION CODES:   Not applicable  INTERVENTION:   -Ensure Enlive po BID, each supplement provides 350 kcal and 20 grams of protein  -MVI with minerals daily  NUTRITION DIAGNOSIS:   Increased nutrient needs related to chronic illness (CHF) as evidenced by estimated needs.  GOAL:   Patient will meet greater than or equal to 90% of their needs  MONITOR:   Supplement acceptance, PO intake, Labs, Weight trends, Skin, I & O's  REASON FOR ASSESSMENT:   Consult Assessment of nutrition requirement/status  ASSESSMENT:   Maria Travis is a 80 y.o. 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  She has been having trouble with her legs and her breathing.  It seemed like she just couldn't get herself together.  Symptoms for 3-4 months.  +cough, productive of phlegm that varies in color based on what she ate.   No fever.  Pt admitted with CHF and gastritis.   Reviewed I/O's: -1.3 L x 24 hours   UOP: 1.4 L x 24 hours  Colostomy output: 150 ml x 24 hours  Spoke with pt, who was sitting in recliner chair at time of visit. She had flat affect and reported stress regarding her move from her home in [place] to an ALF in [place] 3 weeks ago. Pt shares that she generally consumes 3 meals per day (Breakfast: eggs, toast, and cream of wheat, Lunch: sandwich; Dinner: meat, starch, and vegetable). Per pt, intake has been erratic lately due to food choices at ALF. Pt consumed about 75% of breakfast this morning.   Pt endorses wt loss, but unsure how much or UBW. Noted pt has experienced a 6.4% wt loss over the past 6 months, which is not significant for time frame. Pt suspects weight loss is related to fluid losses from diuretics.  Discussed importance of good meal and supplement intake to promote healing. Pt amenable to Ensure supplements and  Medications reviewed and include colace,  lasix, and magnesium oxide.   Labs reviewed: K: 3.1 (on PO supplementation).   NUTRITION - FOCUSED PHYSICAL EXAM:  Flowsheet Row Most Recent Value  Orbital Region No depletion  Upper Arm Region No depletion  Thoracic and Lumbar Region No depletion  Buccal Region No depletion  Temple Region No depletion  Clavicle Bone Region No depletion  Clavicle and Acromion Bone Region No depletion  Scapular Bone Region No depletion  Dorsal Hand No depletion  Patellar Region No depletion  Anterior Thigh Region No depletion  Posterior Calf Region No depletion  Edema (RD Assessment) Mild  Hair Reviewed  Eyes Reviewed  Mouth Reviewed  Skin Reviewed  Nails Reviewed       Diet Order:   Diet Order             Diet Heart Room service appropriate? Yes; Fluid consistency: Thin; Fluid restriction: 1500 mL Fluid  Diet effective now                   EDUCATION NEEDS:   Education needs have been addressed  Skin:  Skin Assessment: Reviewed RN Assessment  Last BM:  05/20/21 (via colostomy)  Height:   Ht Readings from Last 1 Encounters:  05/20/21 '5\' 2"'$  (1.575 m)    Weight:   Wt Readings from Last 1 Encounters:  05/20/21 53.5 kg    Ideal Body Weight:  50 kg  BMI:  Body mass index is 21.57 kg/m.  Estimated Nutritional  Needs:   Kcal:  1600-1800  Protein:  80-95 grams  Fluid:  > 1.6 L    Loistine Chance, RD, LDN, [place] Registered Dietitian II Certified Diabetes Care and Education Specialist Please refer to [facility] for RD and/or RD on-call/weekend/after hours pager

## 2021-05-20 NOTE — Progress Notes (Signed)
PROGRESS NOTE    Maria Travis  [id number] DOB: [birth year] DOA: 05/18/2021 PCP: Seward Carol, MD   Brief Narrative: 80 year old with past medical history significant for A. fib, hypertension, colostomy, HLD presented with edema.  Patient presented with worsening shortness of breath, and lower extremity edema.  During her follow-up with Dr. Remonia Richter on 5/19 her Lasix was changed to as needed.  Her [name] was increased on 5/26.  She has a colostomy, presented with abdominal distention.   Assessment & Plan:   Principal Problem:   Acute on chronic diastolic CHF (congestive heart failure) (HCC) Active Problems:   Paroxysmal atrial fibrillation (HCC)   Hypertension   Hyperlipidemia   Gastritis   1-Acute on chronic diastolic heart failure Scented with worsening shortness of breath, lower extremity edema, chest x-ray without pulmonary edema, but elevation of BNP. She received IV Lasix in the ED. Plan to continue with IV Lasix today. Patient has not been taking Entresto. Per daughter Rush Barer was discontinue, two months ago.  1.9 L Weight : 117  Gastritis: CT abdomen showed possible gastritis And to continue with PPI and Carafate Follow-up with GI as an outpatient  Hypertension: Patient has not been taking Entresto.  Continue with Toprol  Hyperlipidemia: Continue Lipitor  A. fib: Rate controlled continue with amiodarone and Toprol. Treated with Eliquis  Cognitive impairment:  She will need further evaluation by her PCP Check B 12  Hypokalemia: Replete orally  Estimated body mass index is 21.57 kg/m as calculated from the following:   Height as of this encounter: '5\' 2"'$  (1.575 m).   Weight as of this encounter: 53.5 kg.   DVT prophylaxis: Eliquis Code Status: Full code Family Communication: Daughter over phone Disposition Plan:  Status is: Observation  The patient will require care spanning > 2 midnights and should be moved to inpatient because: IV  treatments appropriate due to intensity of illness or inability to take PO  Dispo: The patient is from: Home              Anticipated d/c is to: Home              Patient currently is not medically stable to d/c.   Difficult to place patient No        Consultants:  None  Procedures:    Antimicrobials:    Subjective: She is breathing better. LE edema improving.    Objective: Vitals:   05/19/21 2000 05/19/21 2206 05/20/21 0118 05/20/21 0301  BP: (!) 164/95 (!) 160/86  (!) 162/94  Pulse: 76 74  81  Resp: (!) '24 19  18  '$ Temp:  98.3 F (36.8 C)  97.7 F (36.5 C)  TempSrc:  Oral  Oral  SpO2: 98% 100%  98%  Weight:   53.5 kg   Height:   '5\' 2"'$  (1.575 m)     Intake/Output Summary (Last 24 hours) at 05/20/2021 0731 Last data filed at 05/20/2021 0705 Gross per 24 hour  Intake 240 ml  Output 1850 ml  Net -1610 ml   Filed Weights   05/20/21 0118  Weight: 53.5 kg    Examination:  General exam: Appears calm and comfortable  Respiratory system: BL crackles.  Cardiovascular system: S1 & S2 heard, RRR. No JVD, murmurs, rubs, gallops or clicks. No pedal edema. Gastrointestinal system: Abdomen is nondistended, soft and nontender. No organomegaly or masses felt. Normal bowel sounds heard. Central nervous system: Alert and oriented.  Extremities: Symmetric 5 x 5 power.    Data Reviewed:  I have personally reviewed following labs and imaging studies  CBC: Recent Labs  Lab 05/18/21 1740 05/20/21 0205  WBC 6.2 5.5  NEUTROABS 2.9  --   HGB 11.2* 12.1  HCT 37.1 40.1  MCV 100.3* 100.8*  PLT 192 [id number]   Basic Metabolic Panel: Recent Labs  Lab 05/18/21 1740 05/20/21 0205  NA 144 141  K 3.7 3.1*  CL 116* 108  CO2 20* 21*  GLUCOSE 110* 94  BUN 24* 21  CREATININE 1.39* 1.26*  CALCIUM 9.2 9.3   GFR: Estimated Creatinine Clearance: 27.7 mL/min (A) (by C-G formula based on SCr of 1.26 mg/dL (H)). Liver Function Tests: Recent Labs  Lab 05/18/21 1740  AST 32   ALT 36  ALKPHOS 90  BILITOT 0.6  PROT 6.4*  ALBUMIN 3.3*   Recent Labs  Lab 05/18/21 1740  LIPASE 53*   No results for input(s): AMMONIA in the last 168 hours. Coagulation Profile: No results for input(s): INR, PROTIME in the last 168 hours. Cardiac Enzymes: No results for input(s): CKTOTAL, CKMB, CKMBINDEX, TROPONINI in the last 168 hours. BNP (last 3 results) No results for input(s): PROBNP in the last 8760 hours. HbA1C: No results for input(s): HGBA1C in the last 72 hours. CBG: No results for input(s): GLUCAP in the last 168 hours. Lipid Profile: No results for input(s): CHOL, HDL, LDLCALC, TRIG, CHOLHDL, LDLDIRECT in the last 72 hours. Thyroid Function Tests: No results for input(s): TSH, T4TOTAL, FREET4, T3FREE, THYROIDAB in the last 72 hours. Anemia Panel: No results for input(s): VITAMINB12, FOLATE, FERRITIN, TIBC, IRON, RETICCTPCT in the last 72 hours. Sepsis Labs: No results for input(s): PROCALCITON, LATICACIDVEN in the last 168 hours.  Recent Results (from the past 240 hour(s))  SARS CORONAVIRUS 2 (TAT 6-24 HRS) Nasopharyngeal Nasopharyngeal Swab     Status: None   Collection Time: 05/19/21  7:36 PM   Specimen: Nasopharyngeal Swab  Result Value Ref Range Status   SARS Coronavirus 2 NEGATIVE NEGATIVE Final    Comment: (NOTE) SARS-CoV-2 target nucleic acids are NOT DETECTED.  The SARS-CoV-2 RNA is generally detectable in upper and lower respiratory specimens during the acute phase of infection. Negative results do not preclude SARS-CoV-2 infection, do not rule out co-infections with other pathogens, and should not be used as the sole basis for treatment or other patient management decisions. Negative results must be combined with clinical observations, patient history, and epidemiological information. The expected result is Negative.  Fact Sheet for Patients: SugarRoll.be  Fact Sheet for Healthcare  Providers: https://www.woods-mathews.com/  This test is not yet approved or cleared by the [place] FDA and  has been authorized for detection and/or diagnosis of SARS-CoV-2 by FDA under an Emergency Use Authorization (EUA). This EUA will remain  in effect (meaning this test can be used) for the duration of the COVID-19 declaration under Se ction 564(b)(1) of the Act, 21 U.S.C. section 360bbb-3(b)(1), unless the authorization is terminated or revoked sooner.  Performed at [facility], [place] [address], [place], [place] [address]          Radiology Studies: CT ABDOMEN PELVIS W CONTRAST  Result Date: 05/19/2021 CLINICAL DATA:  Abdominal pain and history left-sided colostomy. EXAM: CT ABDOMEN AND PELVIS WITH CONTRAST TECHNIQUE: Multidetector CT imaging of the abdomen and pelvis was performed using the standard protocol following bolus administration of intravenous contrast. CONTRAST:  193m OMNIPAQUE IOHEXOL 300 MG/ML  SOLN COMPARISON:  12/05/2020 FINDINGS: Lower chest: Stable to slightly smaller right pleural effusion. No left-sided  pleural fluid. Cardiac enlargement especially involving right-sided chambers with prominent reflux of contrast into the IVC and hepatic veins consistent with right heart failure. Hepatobiliary: No focal liver abnormality is seen. Status post cholecystectomy. No biliary dilatation. Pancreas: Stable dilatation of the pancreatic duct without identifiable pancreatic mass or evidence of pancreatitis. Spleen: Normal in size without focal abnormality. Adrenals/Urinary Tract: Previously noted right adrenal nodule less evident. Kidneys demonstrate no renal calculi, focal lesion, or hydronephrosis. Stable rim calcified cyst in the lower pole of the right kidney. Bladder is unremarkable. Stomach/Bowel: Despite underdistention, there is suggestion of new gastric wall thickening throughout the stomach with some associated free fluid posterior to the fundus  and body of the stomach. Underlying gastritis/peptic disease is suspected. No evidence of free air to suggest overt perforated ulcer. Other bowel appears relatively stable with no evidence of overt obstruction. 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 Vascular/Lymphatic: Stable atherosclerosis of the abdominal aorta without evidence of aneurysm. Reproductive: Stable uterine calcification consistent with degenerative fibroid. No adnexal masses. Other: No ascites or focal abscess identified. Musculoskeletal: Stable degenerative disc disease throughout the lumbar spine. IMPRESSION: 1. New gastric wall thickening throughout the stomach with associated free fluid posterior to the stomach. Findings suggest acute gastritis/peptic disease. No evidence of free intraperitoneal air to suggest overt perforated ulcer. 2. Chronic dilated pancreatic duct. 3. Less evident right adrenal nodule compared to prior studies. Electronically Signed   By: Aletta Edouard M.D.   On: 05/19/2021 12:39   DG Chest Port 1 View  Result Date: 05/19/2021 CLINICAL DATA:  80 year old female with shortness of breath onset today. EXAM: PORTABLE CHEST 1 VIEW COMPARISON:  Chest radiographs 05/06/2021 and earlier. FINDINGS: Portable AP semi upright view at 1051 hours. Stable cardiomegaly and mediastinal contours. Calcified aortic atherosclerosis. Visualized tracheal air column is within normal limits. No acute pulmonary edema. Small bilateral pleural effusions do not appear significantly changed from earlier this month. No new pulmonary opacity. No acute osseous abnormality identified. IMPRESSION: Stable cardiomegaly and small pleural effusions. No overt edema or new cardiopulmonary abnormality. Aortic Atherosclerosis (ICD10-I70.0). Electronically Signed   By: Genevie Ann M.D.   On: 05/19/2021 11:25        Scheduled Meds:  allopurinol  100 mg Oral Daily   amiodarone  200 mg Oral Daily   apixaban  2.5  mg Oral BID   atorvastatin  20 mg Oral QPM   colchicine  0.6 mg Oral Daily   docusate sodium  100 mg Oral BID   furosemide  40 mg Intravenous BID   magnesium oxide  400 mg Oral Daily   metoprolol succinate  50 mg Oral Daily   pantoprazole (PROTONIX) IV  40 mg Intravenous Q12H   potassium chloride  40 mEq Oral BID   sodium chloride flush  3 mL Intravenous Q12H   sucralfate  1 g Oral TID WC & HS   Continuous Infusions:   LOS: 0 days    Time spent: 35 minutes.     Elmarie Shiley, MD [facility]   If 7PM-7AM, please contact night-coverage [email or web address]  05/20/2021, 7:31 AM

## 2021-05-20 NOTE — TOC Initial Note (Signed)
Transition of Care [facility]) - Initial/Assessment Note    Patient Details  Name: Maria Travis MRN: [id number] Date of Birth: 07/25/1941  Transition of Care [facility] CM/SW Contact:    Tresa Endo Phone Number: 05/20/2021, 4:38 PM  Clinical Narrative:                 CSW met with pt, her daughter and son-in-law at bedside. CSW introduced self and explained role at the hospital. Pt reports that PTA the facility pt came from a IDL at [facility]. PT reports pt needs limited assistance and min guard. PT reports pt would do well returning to her IDL at [facility] reviewed PT/OT recommendations for SNF. Pt and family reports, they are comfortable with pt returning home and thinks that [facility] would be beneficial as well.  Pt gave CSW permission to contact facility, CSW contacted Melody at [facility] who stated that since pt is in IDL we will need to set up [facility] before pt DC. Melody suggested [place] as a recommended user for the facility. CSW informed NCM and will follow up tomorrow on pt DC following pt DC summary.   CSW will continue to follow.    Expected Discharge Plan: Home/Self Care (IDL) Barriers to Discharge: Continued Medical Work up   Patient Goals and CMS Choice Patient states their goals for this hospitalization and ongoing recovery are:: Rehab CMS Medicare.gov Compare Post Acute Care list provided to:: Patient Choice offered to / list presented to : Patient  Expected Discharge Plan and Services Expected Discharge Plan: Home/Self Care (IDL) In-house Referral: Clinical Social Work Discharge Planning Services: NA Post Acute Care Choice: Long Term Acute Care (LTAC) Living arrangements for the past 2 months: [place]                                      Prior Living Arrangements/Services Living arrangements for the past 2 months: [place] Lives with:: Self Patient language and need for interpreter reviewed::  Yes Do you feel safe going back to the place where you live?: Yes      Need for Family Participation in Patient Care: Yes (Comment) Care giver support system in place?: Yes (comment)   Criminal Activity/Legal Involvement Pertinent to Current Situation/Hospitalization: No - Comment as needed  Activities of Daily Living      Permission Sought/Granted Permission sought to share information with : Family Supports Permission granted to share information with : Yes, Verbal Permission Granted  Share Information with NAME: MEBANE,[name]  Permission granted to share info w AGENCY: [place] granted to share info w Relationship: Daughter  Permission granted to share info w Contact Information: [phone]   [phone]  Emotional Assessment Appearance:: Appears stated age Attitude/Demeanor/Rapport: Engaged Affect (typically observed): Accepting Orientation: : Oriented to Self, Oriented to Situation Alcohol / Substance Use: Not Applicable Psych Involvement: No (comment)  Admission diagnosis:  Acute on chronic diastolic CHF (congestive heart failure) (HCC) [I50.33] Acute on chronic congestive heart failure, unspecified heart failure type [facility]) [I50.9] Patient Active Problem List   Diagnosis Date Noted   Acute on chronic diastolic CHF (congestive heart failure) ([place]) 05/19/2021   Gastritis 05/19/2021   Acute HFrEF (heart failure with reduced ejection fraction) ([place])    Enterocolitis    Atrial fibrillation with RVR ([place]) 12/02/2020   Elevated troponin 10/18/2020   Hypertension 10/18/2020   Hyperlipidemia 10/18/2020  AKI (acute kidney injury) ([place]) 10/18/2020   Paroxysmal atrial fibrillation ([place]) 10/17/2020   Atrial fibrillation, rapid ([place])    Hypokalemia    Syncope    PCP:  Seward Carol, MD Pharmacy:   [facility] [place], [place] [address] Phone: [phone] Fax:  [phone]  [facility] [place], [place] [address] Phone: [phone] Fax: [phone]  [facility] DRUG STORE [place] - [address] AT [address] [place] [address] [place] [address] Phone: [phone] Fax: [place], [place] [address] Phone: [phone] Fax: [place], [place] - [place] AT [facility] [place] [address] Phone: [phone] Fax: [place] [id number] [place], [place] AT [facility] OF [place] [address] Phone: [phone] Fax: [phone]     Social Determinants of Health ([place]) Interventions    Readmission Risk Interventions No flowsheet data found.

## 2021-05-21 DIAGNOSIS — I5033 Acute on chronic diastolic (congestive) heart failure: Secondary | ICD-10-CM | POA: Diagnosis not present

## 2021-05-21 LAB — BASIC METABOLIC PANEL
Anion gap: 9 (ref 5–15)
BUN: 16 mg/dL (ref 8–23)
CO2: 24 mmol/L (ref 22–32)
Calcium: 9 mg/dL (ref 8.9–10.3)
Chloride: 105 mmol/L (ref 98–111)
Creatinine, Ser: 1.16 mg/dL — ABNORMAL HIGH (ref 0.44–1.00)
GFR, Estimated: 47 mL/min — ABNORMAL LOW (ref >60)
Glucose, Bld: 99 mg/dL (ref 70–99)
Potassium: 3.6 mmol/L (ref 3.5–5.1)
Sodium: 138 mmol/L (ref 135–145)

## 2021-05-21 LAB — VITAMIN B12: Vitamin B-12: 271 pg/mL (ref 180–914)

## 2021-05-21 MED ORDER — VITAMIN B-12 1000 MCG PO TABS
1000.0000 ug | ORAL_TABLET | Freq: Every day | ORAL | Status: DC
  Administered 2021-05-21: 1000 ug via ORAL
  Filled 2021-05-21: qty 1

## 2021-05-21 MED ORDER — FUROSEMIDE 20 MG PO TABS
40.0000 mg | ORAL_TABLET | Freq: Every day | ORAL | 3 refills | Status: DC
Start: 2021-05-21 — End: 2021-06-03

## 2021-05-21 MED ORDER — POTASSIUM CHLORIDE CRYS ER 20 MEQ PO TBCR
40.0000 meq | EXTENDED_RELEASE_TABLET | Freq: Once | ORAL | Status: AC
  Administered 2021-05-21: 40 meq via ORAL
  Filled 2021-05-21: qty 2

## 2021-05-21 MED ORDER — SUCRALFATE 1 G PO TABS: 1.0000 g | ORAL_TABLET | Freq: Three times a day (TID) | ORAL | 0 refills | Status: DC

## 2021-05-21 MED ORDER — CYANOCOBALAMIN 1000 MCG PO TABS: 1000.0000 ug | ORAL_TABLET | Freq: Every day | ORAL | 0 refills | Status: DC

## 2021-05-21 MED ORDER — POTASSIUM CHLORIDE ER 20 MEQ PO TBCR: 20.0000 meq | EXTENDED_RELEASE_TABLET | Freq: Every day | ORAL | Status: DC

## 2021-05-21 MED ORDER — PANTOPRAZOLE SODIUM 40 MG PO TBEC: 40.0000 mg | DELAYED_RELEASE_TABLET | Freq: Two times a day (BID) | ORAL | 1 refills | Status: DC

## 2021-05-21 NOTE — TOC Transition Note (Signed)
Transition of Care [facility]) - CM/SW Discharge Note   Patient Details  Name: Maria Travis MRN: [id number] Date of Birth: [birth year]  [facility] of [facility]) CM/SW Contact:  Zenon Mayo, RN Phone Number: 05/21/2021, 9:50 AM   Clinical Narrative:    NCM spoke with daughter, [name],  offered choice and informed her that Facility prefers [place], she states she is fine with them for HHPT, [place]. NCM made referral to [facility] with [place].  Soc will begin 24 to 48 hrs post dc.  Patient has a walker and w/chair at home.  She also has a scale and bp cuff.  Her daughter [name] helps her to check her bp when she comes over.  The patient eats in the dinning area of the facility.   Final next level of care: [place] Barriers to Discharge: No Barriers Identified   Patient Goals and CMS Choice Patient states their goals for this hospitalization and ongoing recovery are:: return to [facility] with [facility] CMS Medicare.gov Compare Post Acute Care list provided to:: Patient Represenative (must comment) Choice offered to / list presented to : Adult Children  Discharge Placement                       Discharge Plan and Services In-house Referral: Clinical Social Work Discharge Planning Services: NA Post Acute Care Choice: Long Term Acute Care (LTAC)            DME Agency: NA       HH Arranged: PT, OT HH Agency: [place] Date HH Agency Contacted: 05/21/21 Time [place]: [phone] Representative spoke with at [place]: [place] Determinants of Health ([place]) Interventions     Readmission Risk Interventions No flowsheet data found.

## 2021-05-21 NOTE — TOC Progression Note (Addendum)
Transition of Care [facility]) - Progression Note    Patient Details  Name: Maria Travis MRN: [id number] Date of Birth: February 02, 1941  Transition of Care [facility]) CM/SW Contact  Zenon Mayo, RN Phone Number: 05/21/2021, 9:38 AM  Clinical Narrative:    NCM spoke with patient, she would like this NCM to contact her daughter.  NCM left vm with daughter to return call. Spoke with daughter about [facility] services and informed her that Alfredo Bach works with [facility], she states [facility] is fine with her. NCM made referral to [facility] with [place] for [place], [place].     Expected Discharge Plan: Home/Self Care (IDL) Barriers to Discharge: Continued Medical Work up  Expected Discharge Plan and Services Expected Discharge Plan: Home/Self Care (IDL) In-house Referral: Clinical Social Work Discharge Planning Services: NA Post Acute Care Choice: Long Term Acute Care (LTAC) Living arrangements for the past 2 months: [place]                                       Social Determinants of Health (SDOH) Interventions    Readmission Risk Interventions No flowsheet data found.

## 2021-05-21 NOTE — Discharge Summary (Signed)
Physician Discharge Summary  Atalissa [id number] DOB: 09-30-1941 DOA: 05/18/2021  PCP: Seward Carol, MD  Admit date: 05/18/2021 Discharge date: 05/21/2021  Admitted From: ALF Disposition:  ALF with PT  Recommendations for Outpatient Follow-up:  Follow up with PCP in 1-2 weeks Please obtain BMP/CBC in one week Needs referral to gastroenterologist to evaluate for gastritis. Needs follow upon Volume status and further adjustment of BP medications.   Home Health PT/OT.  Discharge Condition:Stable.  CODE STATUS: Full Code Diet recommendation: Heart Healthy  Brief/Interim Summary: 80 year old with past medical history significant for A. fib, hypertension, colostomy, HLD presented with edema.  Patient presented with worsening shortness of breath, and lower extremity edema.  During her follow-up with Dr. Remonia Richter on 5/19 her Lasix was changed to as needed.  Her [name] was increased on 5/26.   She has a colostomy, presented with abdominal distention.     1-Acute on chronic diastolic heart failure Scented with worsening shortness of breath, lower extremity edema, chest x-ray without pulmonary edema, but elevation of BNP. She received IV Lasix in the ED. Treated with '40mg'$  IV lasix BID.  Patient has not been taking Entresto. Per daughter Rush Barer was discontinue.   Negative 2.4 L Weight : 117--- down to 112.  Plan to discharge on lasix 40 mg daly. Will defer start entresto to PCP./  Plan to discharge home if able to ambulate well.   Gastritis: CT abdomen showed possible gastritis And to continue with PPI and Carafate Follow-up with GI as an outpatient Abdominal pain improved.   Hypertension: Patient has not been taking Entresto.  Continue with Toprol ad lasix.  Further adjustment out patient.    Hyperlipidemia: Continue Lipitor   A. fib: Rate controlled continue with amiodarone and Toprol. Continue  with Eliquis   Cognitive impairment: She will need further  evaluation by her PCP B 12 low normal. Started supplement.    Hypokalemia: Replete orally   Estimated body mass index is 21.57 kg/m as calculated from the following:   Height as of this encounter: '5\' 2"'$  (1.575 m).   Weight as of this encounter: 53.5 kg.    Discharge Diagnoses:  Principal Problem:   Acute on chronic diastolic CHF (congestive heart failure) (HCC) Active Problems:   Paroxysmal atrial fibrillation (HCC)   Hypertension   Hyperlipidemia   Gastritis    Discharge Instructions  Discharge Instructions     Diet - low sodium heart healthy   Complete by: As directed    Increase activity slowly   Complete by: As directed       Allergies as of 05/21/2021   No Known Allergies      Medication List     TAKE these medications    allopurinol 100 MG tablet Commonly known as: ZYLOPRIM Take 100 mg by mouth daily.   amiodarone 200 MG tablet Commonly known as: PACERONE Take 1 tablet (200 mg total) by mouth daily.   apixaban 2.5 MG Tabs tablet Commonly known as: ELIQUIS Take 2.5 mg by mouth 2 (two) times daily.   atorvastatin 20 MG tablet Commonly known as: LIPITOR Take 20 mg by mouth every evening.   colchicine 0.6 MG tablet Take 0.6 mg by mouth daily.   cyanocobalamin 1000 MCG tablet Take 1 tablet (1,000 mcg total) by mouth daily. Start taking on: July 29, 123456   folic acid 1 MG tablet Commonly known as: FOLVITE Take 1 mg by mouth in the morning.   furosemide 20 MG tablet Commonly known as: LASIX Take  2 tablets (40 mg total) by mouth daily.   magnesium oxide 400 MG tablet Commonly known as: MAG-OX Take 1 tablet (400 mg total) by mouth daily.   metoprolol succinate 100 MG 24 hr tablet Commonly known as: TOPROL-XL Take 0.5 tablets (50 mg total) by mouth daily. Take with or immediately following a meal.   pantoprazole 40 MG tablet Commonly known as: PROTONIX Take 1 tablet (40 mg total) by mouth 2 (two) times daily. What changed: when to take  this   Potassium Chloride ER 20 MEQ Tbcr Take 20 mEq by mouth daily.   sucralfate 1 g tablet Commonly known as: CARAFATE Take 1 tablet (1 g total) by mouth 4 (four) times daily -  with meals and at bedtime.   thiamine 100 MG tablet Commonly known as: Vitamin B-1 Take 100 mg by mouth daily.        Follow-up Information     Seward Carol, MD Follow up.   Specialty: Internal Medicine Contact information: [address] [place] [address] [phone]         Donato Heinz, MD .   Specialties: Cardiology, Radiology Contact information: [address] [place] [address] [place], [place] Follow up.   Specialty: Home Health Services Why: HHPT, HHOT Contact information: [address] [place] [address] [phone]                No Known Allergies  Consultations: None   Procedures/Studies: DG Chest 2 View  Result Date: 05/07/2021 CLINICAL DATA:  Follow-up pneumonia. EXAM: CHEST - 2 VIEW COMPARISON:  Chest x-ray 04/20/2021 FINDINGS: The heart is enlarged but stable. Stable tortuosity of the thoracic aorta. There is a persistent right basilar process possibly a combination of infiltrate, atelectasis and effusion. The left lung is grossly clear.  No obvious pulmonary lesions. The bony thorax is intact. IMPRESSION: Persistent right basilar process. Recommend a follow-up chest x-ray in 3 weeks. If this persists patient may require chest CT for further evaluation. Electronically Signed   By: Marijo Sanes M.D.   On: 05/07/2021 12:37   CT ABDOMEN PELVIS W CONTRAST  Result Date: 05/19/2021 CLINICAL DATA:  Abdominal pain and history left-sided colostomy. EXAM: CT ABDOMEN AND PELVIS WITH CONTRAST TECHNIQUE: Multidetector CT imaging of the abdomen and pelvis was performed using the standard protocol following bolus administration of intravenous contrast. CONTRAST:  143m OMNIPAQUE  IOHEXOL 300 MG/ML  SOLN COMPARISON:  12/05/2020 FINDINGS: Lower chest: Stable to slightly smaller right pleural effusion. No left-sided pleural fluid. Cardiac enlargement especially involving right-sided chambers with prominent reflux of contrast into the IVC and hepatic veins consistent with right heart failure. Hepatobiliary: No focal liver abnormality is seen. Status post cholecystectomy. No biliary dilatation. Pancreas: Stable dilatation of the pancreatic duct without identifiable pancreatic mass or evidence of pancreatitis. Spleen: Normal in size without focal abnormality. Adrenals/Urinary Tract: Previously noted right adrenal nodule less evident. Kidneys demonstrate no renal calculi, focal lesion, or hydronephrosis. Stable rim calcified cyst in the lower pole of the right kidney. Bladder is unremarkable. Stomach/Bowel: Despite underdistention, there is suggestion of new gastric wall thickening throughout the stomach with some associated free fluid posterior to the fundus and body of the stomach. Underlying gastritis/peptic disease is suspected. No evidence of free air to suggest overt perforated ulcer. Other bowel appears relatively stable with no evidence of overt obstruction. 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 Vascular/Lymphatic: Stable atherosclerosis of the abdominal aorta without evidence of aneurysm. Reproductive: Stable uterine calcification consistent with degenerative fibroid. No adnexal masses. Other: No ascites or focal abscess identified. Musculoskeletal: Stable degenerative disc disease throughout the lumbar spine. IMPRESSION: 1. New gastric wall thickening throughout the stomach with associated free fluid posterior to the stomach. Findings suggest acute gastritis/peptic disease. No evidence of free intraperitoneal air to suggest overt perforated ulcer. 2. Chronic dilated pancreatic duct. 3. Less evident right adrenal nodule compared to prior  studies. Electronically Signed   By: Aletta Edouard M.D.   On: 05/19/2021 12:39   DG Chest Port 1 View  Result Date: 05/19/2021 CLINICAL DATA:  80 year old female with shortness of breath onset today. EXAM: PORTABLE CHEST 1 VIEW COMPARISON:  Chest radiographs 05/06/2021 and earlier. FINDINGS: Portable AP semi upright view at 1051 hours. Stable cardiomegaly and mediastinal contours. Calcified aortic atherosclerosis. Visualized tracheal air column is within normal limits. No acute pulmonary edema. Small bilateral pleural effusions do not appear significantly changed from earlier this month. No new pulmonary opacity. No acute osseous abnormality identified. IMPRESSION: Stable cardiomegaly and small pleural effusions. No overt edema or new cardiopulmonary abnormality. Aortic Atherosclerosis (ICD10-I70.0). Electronically Signed   By: Genevie Ann M.D.   On: 05/19/2021 11:25     Subjective: She is feeling better, breathing better. Abdominal distension significantly decrease.   Discharge Exam: Vitals:   05/21/21 0322 05/21/21 0951  BP: 135/81 138/82  Pulse: 78 77  Resp: 17 16  Temp: (!) 97.5 F (36.4 C)   SpO2: 99%      General: Pt is alert, awake, not in acute distress Cardiovascular: RRR, S1/S2 +, no rubs, no gallops Respiratory: CTA bilaterally, no wheezing, no rhonchi Abdominal: Soft, NT, ND, bowel sounds + Extremities: no edema, no cyanosis    The results of significant diagnostics from this hospitalization (including imaging, microbiology, ancillary and laboratory) are listed below for reference.     Microbiology: Recent Results (from the past 240 hour(s))  SARS CORONAVIRUS 2 (TAT 6-24 HRS) Nasopharyngeal Nasopharyngeal Swab     Status: None   Collection Time: 05/19/21  7:36 PM   Specimen: Nasopharyngeal Swab  Result Value Ref Range Status   SARS Coronavirus 2 NEGATIVE NEGATIVE Final    Comment: (NOTE) SARS-CoV-2 target nucleic acids are NOT DETECTED.  The SARS-CoV-2 RNA is  generally detectable in upper and lower respiratory specimens during the acute phase of infection. Negative results do not preclude SARS-CoV-2 infection, do not rule out co-infections with other pathogens, and should not be used as the sole basis for treatment or other patient management decisions. Negative results must be combined with clinical observations, patient history, and epidemiological information. The expected result is Negative.  Fact Sheet for Patients: SugarRoll.be  Fact Sheet for Healthcare Providers: https://www.woods-mathews.com/  This test is not yet approved or cleared by the [place] FDA and  has been authorized for detection and/or diagnosis of SARS-CoV-2 by FDA under an Emergency Use Authorization (EUA). This EUA will remain  in effect (meaning this test can be used) for the duration of the COVID-19 declaration under Se ction 564(b)(1) of the Act, 21 U.S.C. section 360bbb-3(b)(1), unless the authorization is terminated or revoked sooner.  Performed at [facility], [place] [address], [place], [place] [address]      Labs: BNP (last 3 results) Recent Labs    12/02/20 1051 05/19/21 1202  BNP 768.8* [id number]*   Basic Metabolic Panel: Recent Labs  Lab  05/18/21 1740 05/20/21 0205 05/21/21 0247  NA 144 141 138  K 3.7 3.1* 3.6  CL 116* 108 105  CO2 20* 21* 24  GLUCOSE 110* 94 99  BUN 24* 21 16  CREATININE 1.39* 1.26* 1.16*  CALCIUM 9.2 9.3 9.0   Liver Function Tests: Recent Labs  Lab 05/18/21 1740  AST 32  ALT 36  ALKPHOS 90  BILITOT 0.6  PROT 6.4*  ALBUMIN 3.3*   Recent Labs  Lab 05/18/21 1740  LIPASE 53*   No results for input(s): AMMONIA in the last 168 hours. CBC: Recent Labs  Lab 05/18/21 1740 05/20/21 0205  WBC 6.2 5.5  NEUTROABS 2.9  --   HGB 11.2* 12.1  HCT 37.1 40.1  MCV 100.3* 100.8*  PLT 192 169   Cardiac Enzymes: No results for input(s): CKTOTAL, CKMB, CKMBINDEX,  TROPONINI in the last 168 hours. BNP: Invalid input(s): POCBNP CBG: No results for input(s): GLUCAP in the last 168 hours. D-Dimer No results for input(s): DDIMER in the last 72 hours. Hgb A1c No results for input(s): HGBA1C in the last 72 hours. Lipid Profile No results for input(s): CHOL, HDL, LDLCALC, TRIG, CHOLHDL, LDLDIRECT in the last 72 hours. Thyroid function studies No results for input(s): TSH, T4TOTAL, T3FREE, THYROIDAB in the last 72 hours.  Invalid input(s): FREET3 Anemia work up Recent Labs    05/21/21 0247  VITAMINB12 271   Urinalysis    Component Value Date/Time   COLORURINE YELLOW 05/19/2021 1319   APPEARANCEUR HAZY (A) 05/19/2021 1319   LABSPEC 1.020 05/19/2021 1319   PHURINE 5.0 05/19/2021 1319   GLUCOSEU NEGATIVE 05/19/2021 1319   HGBUR SMALL (A) 05/19/2021 1319   BILIRUBINUR NEGATIVE 05/19/2021 1319   KETONESUR NEGATIVE 05/19/2021 1319   PROTEINUR 100 (A) 05/19/2021 1319   NITRITE NEGATIVE 05/19/2021 1319   LEUKOCYTESUR NEGATIVE 05/19/2021 1319   Sepsis Labs Invalid input(s): PROCALCITONIN,  WBC,  LACTICIDVEN Microbiology Recent Results (from the past 240 hour(s))  SARS CORONAVIRUS 2 (TAT 6-24 HRS) Nasopharyngeal Nasopharyngeal Swab     Status: None   Collection Time: 05/19/21  7:36 PM   Specimen: Nasopharyngeal Swab  Result Value Ref Range Status   SARS Coronavirus 2 NEGATIVE NEGATIVE Final    Comment: (NOTE) SARS-CoV-2 target nucleic acids are NOT DETECTED.  The SARS-CoV-2 RNA is generally detectable in upper and lower respiratory specimens during the acute phase of infection. Negative results do not preclude SARS-CoV-2 infection, do not rule out co-infections with other pathogens, and should not be used as the sole basis for treatment or other patient management decisions. Negative results must be combined with clinical observations, patient history, and epidemiological information. The expected result is Negative.  Fact Sheet for  Patients: SugarRoll.be  Fact Sheet for Healthcare Providers: https://www.woods-mathews.com/  This test is not yet approved or cleared by the [place] FDA and  has been authorized for detection and/or diagnosis of SARS-CoV-2 by FDA under an Emergency Use Authorization (EUA). This EUA will remain  in effect (meaning this test can be used) for the duration of the COVID-19 declaration under Se ction 564(b)(1) of the Act, 21 U.S.C. section 360bbb-3(b)(1), unless the authorization is terminated or revoked sooner.  Performed at [facility], [place] [address] [place], [place] [address]      Time coordinating discharge: 40 minutes  SIGNED:   Elmarie Shiley, MD  [facility]

## 2021-05-22 ENCOUNTER — Telehealth (HOSPITAL_BASED_OUTPATIENT_CLINIC_OR_DEPARTMENT_OTHER): Payer: Self-pay | Admitting: *Deleted

## 2021-05-22 NOTE — Telephone Encounter (Signed)
See phone note 7/29

## 2021-05-22 NOTE — Telephone Encounter (Signed)
[name] is calling wanting to confirm Vincent is still supposed to be taking her [name] this way. Please advise.

## 2021-05-22 NOTE — Telephone Encounter (Signed)
Below message received  Spoke with daughter and patient was not taking Entresto prior to recent hospitalization secondary to daughter thinking it was discontinued. She is not sure who d/c it but thinks her last visit with Dr Garnetta Buddy was after her last visit with PCP   Maria Travis at 05/22/2021 12:05 PM  Status: Signed  Maria Travis is calling wanting to confirm [name] is still supposed to be taking her [name] this way. Please advise.     Advised daughter to continue medications suggested by hospital when she was d/c from hospital but would forward message to Dr Gardiner Rhyme for review. Has f/u with him on 8/23

## 2021-05-25 NOTE — Telephone Encounter (Signed)
Returned call to patients daughter who was calling in to check with Dr. Gardiner Rhyme regarding patients Maria Travis. Per patients daughter, patient was not discharged on it and she does not remembered it being stopped. Patients daughter would like to know if patient should restart this. Patients daughter reports that patient is feeling well, and reports her BP has been good but unable to provide readings at this time. Advised patients daughter that I would forward message to Dr. Gardiner Rhyme for him to review and advise. Patients daughter verbalized understanding.

## 2021-05-25 NOTE — Telephone Encounter (Signed)
[name] is calling back due to never hearing back in regards to this.

## 2021-05-25 NOTE — Telephone Encounter (Signed)
OK to hold entresto for now, can address restarting at her appointment later this month.  She should get BMET/magnesium checked sometime this week

## 2021-05-26 NOTE — Telephone Encounter (Signed)
Left message for pt to call.

## 2021-05-27 ENCOUNTER — Other Ambulatory Visit: Payer: Self-pay

## 2021-05-27 DIAGNOSIS — I10 Essential (primary) hypertension: Secondary | ICD-10-CM

## 2021-05-27 DIAGNOSIS — R790 Abnormal level of blood mineral: Secondary | ICD-10-CM

## 2021-05-27 DIAGNOSIS — I480 Paroxysmal atrial fibrillation: Secondary | ICD-10-CM

## 2021-05-27 LAB — BASIC METABOLIC PANEL
BUN/Creatinine Ratio: 17 (ref 12–28)
BUN: 29 mg/dL — ABNORMAL HIGH (ref 8–27)
CO2: 19 mmol/L — ABNORMAL LOW (ref 20–29)
Calcium: 9.7 mg/dL (ref 8.7–10.3)
Chloride: 109 mmol/L — ABNORMAL HIGH (ref 96–106)
Creatinine, Ser: 1.74 mg/dL — ABNORMAL HIGH (ref 0.57–1.00)
Glucose: 96 mg/dL (ref 65–99)
Potassium: 4.6 mmol/L (ref 3.5–5.2)
Sodium: 144 mmol/L (ref 134–144)
eGFR: 29 mL/min/{1.73_m2} — ABNORMAL LOW (ref >59)

## 2021-05-27 LAB — MAGNESIUM: Magnesium: 1.6 mg/dL (ref 1.6–2.3)

## 2021-05-27 NOTE — Progress Notes (Signed)
bet

## 2021-05-28 NOTE — Telephone Encounter (Signed)
Please see result note. Patients daughter aware and verbalized understanding. Patient to see Dr. Gardiner Rhyme 08/10.

## 2021-05-31 NOTE — Progress Notes (Signed)
Cardiology Office Note:    Date:  06/03/2021   ID:  Maria Maria, DOB 10-Apr-1941, MRN [id number]  PCP:  Seward Carol, MD  Cardiologist:  Donato Heinz, MD  Electrophysiologist:  None   Referring MD: Seward Carol, MD   Chief Complaint  Patient presents with   Congestive Heart Failure     History of Present Illness:    Maria Maria is a 80 y.o. 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  She was admitted to [facility] on 12/02/2020 with atrial fibrillation with RVR and new onset systolic heart failure, EF 25 to 30%.  LHC showed normal coronary arteries.  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  She underwent TEE/cardioversion on 12/05/2020.  She was discharged on amiodarone.  Repeat echo 02/09/2021 showed EF improved to 55%, also with grade 2 diastolic dysfunction, normal RV function, RVSP 56 mmHg, mild MR, moderate to severe TR, mild left atrial dilatation, moderate right atrial dilatation.  Since last clinic visit, she was admitted to [facility] from 7/25 through 05/21/2021 with acute on chronic diastolic heart failure.  She was diagnosed with IV Lasix and discharged on p.o. Lasix 40 mg daily.  Her [name] was discontinued.  Labs on 05/27/2021 showed significant worsening in kidney function (creatinine 1.16-1.74).  Her Lasix was held.  Weight is 117 lbs today, was 116 lbs at last clinic visit in 5/19.  She reports she has been having dyspnea and noted worsening lower extremity edema.  Saw PCP yesterday and started Lasix, took yesterday and today.  Denies any chest pain but reports that she is short of breath with exertion.  Denies any lightheadedness, syncope, or palpitations.    Wt Readings from Last 3 Encounters:  06/03/21 117 lb 6.4 oz (53.3 kg)  05/21/21 112 lb 4.8 oz (50.9 kg)  03/12/21 116 lb 12.8  oz (53 kg)      Past Medical History:  Diagnosis Date   Atrial fibrillation (HCC)    Chronic diastolic (congestive) heart failure (HCC)    Diverticulitis    High cholesterol    Hypertension     Past Surgical History:  Procedure Laterality Date   CARDIOVERSION N/A 12/05/2020   Procedure: CARDIOVERSION;  Surgeon: Sanda Klein, MD;  Location: [facility];  Service: Cardiovascular;  Laterality: N/A;   CHOLECYSTECTOMY     COLOSTOMY     RIGHT/LEFT HEART CATH AND CORONARY ANGIOGRAPHY N/A 12/04/2020   Procedure: RIGHT/LEFT HEART CATH AND CORONARY ANGIOGRAPHY;  Surgeon: Belva Crome, MD;  Location: [place] CV LAB;  Service: Cardiovascular;  Laterality: N/A;   TEE WITHOUT CARDIOVERSION N/A 12/05/2020   Procedure: TRANSESOPHAGEAL ECHOCARDIOGRAM (TEE);  Surgeon: Sanda Klein, MD;  Location: [place] ENDOSCOPY;  Service: Cardiovascular;  Laterality: N/A;    Current Medications: Current Meds  Medication Sig   allopurinol (ZYLOPRIM) 100 MG tablet Take 100 mg by mouth daily.   amiodarone (PACERONE) 200 MG tablet Take 1 tablet (200 mg total) by mouth daily.   apixaban (ELIQUIS) 2.5 MG TABS tablet Take 2.5 mg by mouth 2 (two) times daily.   atorvastatin (LIPITOR) 20 MG tablet Take 20 mg by mouth every evening.   colchicine 0.6 MG tablet Take 0.6 mg by mouth daily.   folic acid (FOLVITE) 1 MG tablet Take 1 mg by mouth in the morning.   magnesium oxide (MAG-OX) 400 MG tablet Take 1 tablet (400 mg total) by mouth daily.  metoprolol succinate (TOPROL-XL) 100 MG 24 hr tablet Take 0.5 tablets (50 mg total) by mouth daily. Take with or immediately following a meal.   pantoprazole (PROTONIX) 40 MG tablet Take 1 tablet (40 mg total) by mouth 2 (two) times daily.   sucralfate (CARAFATE) 1 g tablet Take 1 tablet (1 g total) by mouth 4 (four) times daily -  with meals and at bedtime.   thiamine (VITAMIN B-1) 100 MG tablet Take 100 mg by mouth daily.   vitamin B-12 1000 MCG tablet Take 1 tablet (1,000  mcg total) by mouth daily.   [DISCONTINUED] furosemide (LASIX) 20 MG tablet Take 2 tablets (40 mg total) by mouth daily.     Allergies:   Patient has no known allergies.   Social History   Socioeconomic History   Marital status: Single    Spouse name: Not on file   Number of children: Not on file   Years of education: Not on file   Highest education level: Not on file  Occupational History   Occupation: retired  Tobacco Use   Smoking status: Never   Smokeless tobacco: Never  Vaping Use   Vaping Use: Never used  Substance and Sexual Activity   Alcohol use: Not Currently    Comment: weekends   Drug use: No   Sexual activity: Not on file  Other Topics Concern   Not on file  Social History Narrative   Not on file   Social Determinants of Health   Financial Resource Strain: Not on file  Food Insecurity: Not on file  Transportation Needs: Not on file  Physical Activity: Not on file  Stress: Not on file  Social Connections: Not on file     Family History: The patient's family history is not on file.  ROS:   Please see the history of present illness.     All other systems reviewed and are negative.  EKGs/Labs/Other Studies Reviewed:    The following studies were reviewed today:   EKG:  03/12/2021- The ekg ordered today demonstrates sinus rhythm, rate 69, left axis deviation, poor R wave progression   Recent Labs: 10/18/2020: TSH 1.288 05/18/2021: ALT 36 05/19/2021: B Natriuretic Peptide 2,171.9 05/20/2021: Hemoglobin 12.1; Platelets 169 05/27/2021: BUN 29; Creatinine, Ser 1.74; Magnesium 1.6; Potassium 4.6; Sodium 144  Recent Lipid Panel    Component Value Date/Time   CHOL 162 10/18/2020 0208   TRIG 148 10/18/2020 0208   HDL 53 10/18/2020 0208   CHOLHDL 3.1 10/18/2020 0208   VLDL 30 10/18/2020 0208   LDLCALC 79 10/18/2020 0208    Physical Exam:    VS:  BP 132/76   Pulse 69   Resp 18   Ht '5\' 2"'$  (1.575 m)   Wt 117 lb 6.4 oz (53.3 kg)   SpO2 93%   BMI  21.47 kg/m     Wt Readings from Last 3 Encounters:  06/03/21 117 lb 6.4 oz (53.3 kg)  05/21/21 112 lb 4.8 oz (50.9 kg)  03/12/21 116 lb 12.8 oz (53 kg)     GEN:  in no acute distress HEENT: Normal NECK:+ JVD; No carotid bruits CARDIAC: RRR, 2/6 systolic murmur, no rubs, no gallops RESPIRATORY:  Clear to auscultation without rales, wheezing or rhonchi  ABDOMEN: Soft, non-tender, non-distended MUSCULOSKELETAL:  1+ edema; No deformity  SKIN: Warm and dry NEUROLOGIC:  Alert and oriented x 3 PSYCHIATRIC:  Normal affect   ASSESSMENT:    1. Chronic combined systolic and diastolic heart failure ([place])  2. Persistent atrial fibrillation ([place])   3. AKI (acute kidney injury) ([place])   4. Primary hypertension     PLAN:    Chronic combined systolic and diastolic heart failure:She was admitted to [facility] on 12/02/2020 with atrial fibrillation with RVR and new onset systolic heart failure, EF 25 to 30%.  LHC showed normal coronary arteries.  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  Repeat echocardiogram on 02/09/2021 showed LVEF [id number], grade 2 diastolic dysfunction, normal RV function, mild left atrial dilatation, moderate right atrial dilatation, mild MR, moderate to severe TR. [name], Farxiga were discontinued during recent hospitalization 04/2021.  Hold for now given AKI -Continue Toprol-XL 100 mg daily -Has been taking Lasix 40 mg daily since recent hospitalization, held on 8/3 due to AKI (creatinine had increased from 1.2-1.7).  She does appear more volume overloaded now with holding Lasix.  Creatinine checked yesterday by PCP, had improved to 1.5.  I do suspect that she will need scheduled Lasix but developed AKI taking 40 mg 7 days/week.  Recommend taking 5 days/week, would hold on Wednesdays and Sundays.  Advised to take her potassium supplement when she takes her Lasix.  Will repeat CMP, magnesium, BNP next week.  Recommended monitoring daily weights and call if gains more than 3 pounds  in 1 day or 5 pounds in 1 week          Persistent atrial fibrillation: Presented to [facility] on 12/02/2020 with A. fib with RVR.  CHA2DS2-VASc 5 (CHF, hypertension, age x2, female).  Underwent successful TEE/cardioversion on 12/05/2020 -Continue amiodarone 200 mg daily -Continue Eliquis 2.5 mg twice daily (age greater than 80, weight less than 60 kg).   -Continue Toprol-XL 100 mg daily  Hypertension: Continue metoprolol as above.  Appears controlled  AKI: Creatinine 1.74 on 05/27/2021.  Lasix was held with improvement to creatinine 1.48 on 8/9.  Restarting Lasix 5 days/week as above, will recheck creatinine next week  RTC in 1 month  Medication Adjustments/Labs and Tests Ordered: Current medicines are reviewed at length with the patient today.  Concerns regarding medicines are outlined above.  Orders Placed This Encounter  Procedures   Comprehensive metabolic panel   Magnesium   Brain natriuretic peptide   EKG 12-Lead    Meds ordered this encounter  Medications   furosemide (LASIX) 40 MG tablet    Sig: Take furosemide (40 mg) 5 times a week-HOLD ON Wednesdays AND Sundays    Dispense:  90 tablet    Refill:  3   Potassium Chloride ER 20 MEQ TBCR    Sig: Take potassium (20 meq) 5 days a week (on days you take the furosemide (Lasix))-HOLD on Wednesdays and Sundays    Dispense:  90 tablet    Refill:  3     Patient Instructions  Medication Instructions:  Take furosemide (Lasix) 40 mg five days a week-HOLD on Wednesdays and Sundays  Take potassium 20 meq five days a week (with the lasix)-HOLD on Wednesdays and Sundays  Continue magnesium  *If you need a refill on your cardiac medications before your next appointment, please call your pharmacy*   Lab Work: Please return for labs in 1 week (CMET, Mag, BNP)  Our in office lab hours are Monday-Friday 8:00-4:00, closed for lunch 12:45-1:45 pm.  No appointment needed.  Follow-Up: At [facility], you and your health needs are our  priority.  As part of our continuing mission to provide you with exceptional heart care, we have created designated Provider Care Teams.  These Care  Teams include your primary Cardiologist (physician) and Advanced Practice Providers (APPs -  Physician Assistants and Nurse Practitioners) who all work together to provide you with the care you need, when you need it.  We recommend signing up for the patient portal called "MyChart".  Sign up information is provided on this After Visit Summary.  MyChart is used to connect with patients for Virtual Visits (Telemedicine).  Patients are able to view lab/test results, encounter notes, upcoming appointments, etc.  Non-urgent messages can be sent to your provider as well.   To learn more about what you can do with MyChart, go to [email or web address].    Your next appointment:   Wednesday, September 7th at 10:40 AM with Dr. Gardiner Rhyme  Other Instructions Continue to weigh daily-call the office if you gain 3 lbs overnight or 5 lbs in 1 week    Signed, Donato Heinz, MD  06/03/2021 9:44 AM    [place]

## 2021-06-03 ENCOUNTER — Ambulatory Visit (INDEPENDENT_AMBULATORY_CARE_PROVIDER_SITE_OTHER): Payer: Medicare Other | Admitting: Cardiology

## 2021-06-03 ENCOUNTER — Other Ambulatory Visit: Payer: Self-pay

## 2021-06-03 ENCOUNTER — Encounter: Payer: Self-pay | Admitting: Cardiology

## 2021-06-03 VITALS — BP 132/76 | HR 69 | Resp 18 | Ht 62.0 in | Wt 117.4 lb

## 2021-06-03 DIAGNOSIS — I10 Essential (primary) hypertension: Secondary | ICD-10-CM | POA: Diagnosis not present

## 2021-06-03 DIAGNOSIS — I5042 Chronic combined systolic (congestive) and diastolic (congestive) heart failure: Secondary | ICD-10-CM | POA: Diagnosis not present

## 2021-06-03 DIAGNOSIS — I4819 Other persistent atrial fibrillation: Secondary | ICD-10-CM | POA: Diagnosis not present

## 2021-06-03 DIAGNOSIS — N179 Acute kidney failure, unspecified: Secondary | ICD-10-CM

## 2021-06-03 MED ORDER — POTASSIUM CHLORIDE ER 20 MEQ PO TBCR: EXTENDED_RELEASE_TABLET | ORAL | 3 refills | Status: DC

## 2021-06-03 MED ORDER — FUROSEMIDE 40 MG PO TABS: ORAL_TABLET | ORAL | 3 refills | Status: DC

## 2021-06-03 NOTE — Patient Instructions (Signed)
Medication Instructions:  Take furosemide (Lasix) 40 mg five days a week-HOLD on Wednesdays and Sundays  Take potassium 20 meq five days a week (with the lasix)-HOLD on Wednesdays and Sundays  Continue magnesium  *If you need a refill on your cardiac medications before your next appointment, please call your pharmacy*   Lab Work: Please return for labs in 1 week (CMET, Mag, BNP)  Our in office lab hours are Monday-Friday 8:00-4:00, closed for lunch 12:45-1:45 pm.  No appointment needed.  Follow-Up: At [facility], you and your health needs are our priority.  As part of our continuing mission to provide you with exceptional heart care, we have created designated Provider Care Teams.  These Care Teams include your primary Cardiologist (physician) and Advanced Practice Providers (APPs -  Physician Assistants and Nurse Practitioners) who all work together to provide you with the care you need, when you need it.  We recommend signing up for the patient portal called "MyChart".  Sign up information is provided on this After Visit Summary.  MyChart is used to connect with patients for Virtual Visits (Telemedicine).  Patients are able to view lab/test results, encounter notes, upcoming appointments, etc.  Non-urgent messages can be sent to your provider as well.   To learn more about what you can do with MyChart, go to [email or web address].    Your next appointment:   Wednesday, September 7th at 10:40 AM with Dr. Gardiner Rhyme  Other Instructions Continue to weigh daily-call the office if you gain 3 lbs overnight or 5 lbs in 1 week

## 2021-06-12 LAB — COMPREHENSIVE METABOLIC PANEL
ALT: 23 IU/L (ref 0–32)
AST: 23 IU/L (ref 0–40)
Albumin/Globulin Ratio: 1.5 (ref 1.2–2.2)
Albumin: 3.9 g/dL (ref 3.6–4.6)
Alkaline Phosphatase: 109 IU/L (ref 44–121)
BUN/Creatinine Ratio: 26 (ref 12–28)
BUN: 27 mg/dL (ref 8–27)
Bilirubin Total: 0.3 mg/dL (ref 0.0–1.2)
CO2: 17 mmol/L — ABNORMAL LOW (ref 20–29)
Calcium: 9.2 mg/dL (ref 8.7–10.3)
Chloride: 111 mmol/L — ABNORMAL HIGH (ref 96–106)
Creatinine, Ser: 1.02 mg/dL — ABNORMAL HIGH (ref 0.57–1.00)
Globulin, Total: 2.6 g/dL (ref 1.5–4.5)
Glucose: 76 mg/dL (ref 65–99)
Potassium: 4.4 mmol/L (ref 3.5–5.2)
Sodium: 145 mmol/L — ABNORMAL HIGH (ref 134–144)
Total Protein: 6.5 g/dL (ref 6.0–8.5)
eGFR: 55 mL/min/{1.73_m2} — ABNORMAL LOW (ref >59)

## 2021-06-12 LAB — MAGNESIUM: Magnesium: 1.5 mg/dL — ABNORMAL LOW (ref 1.6–2.3)

## 2021-06-16 ENCOUNTER — Ambulatory Visit: Payer: Medicare Other | Admitting: Cardiology

## 2021-06-17 ENCOUNTER — Other Ambulatory Visit: Payer: Self-pay | Admitting: Cardiology

## 2021-06-28 NOTE — Progress Notes (Signed)
Cardiology Office Note:    Date:  07/01/2021   ID:  [name], DOB [birth year], MRN [id number]  PCP:  Seward Carol, MD  Cardiologist:  Donato Heinz, MD  Electrophysiologist:  None   Referring MD: Seward Carol, MD   Chief Complaint  Patient presents with   Shortness of Breath      History of Present Illness:    [name] is a 80 y.o. 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  She was admitted to [facility] on 12/02/2020 with atrial fibrillation with RVR and new onset systolic heart failure, EF 25 to 30%.  LHC showed normal coronary arteries.  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  She underwent TEE/cardioversion on 12/05/2020.  She was discharged on amiodarone.  Repeat echo 02/09/2021 showed EF improved to 55%, also with grade 2 diastolic dysfunction, normal RV function, RVSP 56 mmHg, mild MR, moderate to severe TR, mild left atrial dilatation, moderate right atrial dilatation.  She was admitted to [facility] from 7/25 through 05/21/2021 with acute on chronic diastolic heart failure.  She was diuresed with IV Lasix and discharged on p.o. Lasix 40 mg daily.  Her [name] was discontinued.  Labs on 05/27/2021 showed significant worsening in kidney function (creatinine 1.16-1.74).  Her Lasix was held.  Since last clinic visit, she reports that she is having shortness of breath and occasional chest pain.  Denies any lightheadedness, syncope, or palpitations.  Reported weight has been stable at home.   Wt Readings from Last 3 Encounters:  07/01/21 119 lb (54 kg)  06/03/21 117 lb 6.4 oz (53.3 kg)  05/21/21 112 lb 4.8 oz (50.9 kg)      Past Medical History:  Diagnosis Date   Atrial fibrillation (HCC)    Chronic diastolic (congestive) heart failure (HCC)    Diverticulitis    High cholesterol     Hypertension     Past Surgical History:  Procedure Laterality Date   CARDIOVERSION N/A 12/05/2020   Procedure: CARDIOVERSION;  Surgeon: Sanda Klein, MD;  Location: [facility];  Service: Cardiovascular;  Laterality: N/A;   CHOLECYSTECTOMY     COLOSTOMY     RIGHT/LEFT HEART CATH AND CORONARY ANGIOGRAPHY N/A 12/04/2020   Procedure: RIGHT/LEFT HEART CATH AND CORONARY ANGIOGRAPHY;  Surgeon: Belva Crome, MD;  Location: [place] CV LAB;  Service: Cardiovascular;  Laterality: N/A;   TEE WITHOUT CARDIOVERSION N/A 12/05/2020   Procedure: TRANSESOPHAGEAL ECHOCARDIOGRAM (TEE);  Surgeon: Sanda Klein, MD;  Location: [place] ENDOSCOPY;  Service: Cardiovascular;  Laterality: N/A;    Current Medications: Current Meds  Medication Sig   allopurinol (ZYLOPRIM) 100 MG tablet Take 100 mg by mouth daily.   amiodarone (PACERONE) 200 MG tablet Take 1 tablet (200 mg total) by mouth daily.   apixaban (ELIQUIS) 2.5 MG TABS tablet Take 2.5 mg by mouth 2 (two) times daily.   atorvastatin (LIPITOR) 20 MG tablet Take 20 mg by mouth every evening.   colchicine 0.6 MG tablet Take 0.6 mg by mouth daily.   folic acid (FOLVITE) 1 MG tablet Take 1 mg by mouth in the morning.   magnesium oxide (MAG-OX) 400 MG tablet Take 1 tablet (400 mg total) by mouth daily.   metoprolol succinate (TOPROL-XL) 100 MG 24 hr tablet Take 0.5 tablets (50 mg total) by mouth daily. Take with or immediately following a meal.   pantoprazole (PROTONIX) 40 MG tablet Take 1  tablet (40 mg total) by mouth 2 (two) times daily.   thiamine (VITAMIN B-1) 100 MG tablet Take 100 mg by mouth daily.   vitamin B-12 1000 MCG tablet Take 1 tablet (1,000 mcg total) by mouth daily.   [DISCONTINUED] furosemide (LASIX) 40 MG tablet Take furosemide (40 mg) 5 times a week-HOLD ON Wednesdays AND Sundays   [DISCONTINUED] MAGnesium-Oxide 400 (240 Mg) MG tablet TAKE 1 TABLET(400 MG) BY MOUTH TWICE DAILY   [DISCONTINUED] Potassium Chloride ER 20 MEQ TBCR Take  potassium (20 meq) 5 days a week (on days you take the furosemide (Lasix))-HOLD on Wednesdays and Sundays   [DISCONTINUED] sucralfate (CARAFATE) 1 g tablet Take 1 tablet (1 g total) by mouth 4 (four) times daily -  with meals and at bedtime.     Allergies:   Patient has no known allergies.   Social History   Socioeconomic History   Marital status: Single    Spouse name: Not on file   Number of children: Not on file   Years of education: Not on file   Highest education level: Not on file  Occupational History   Occupation: retired  Tobacco Use   Smoking status: Never   Smokeless tobacco: Never  Vaping Use   Vaping Use: Never used  Substance and Sexual Activity   Alcohol use: Not Currently    Comment: weekends   Drug use: No   Sexual activity: Not on file  Other Topics Concern   Not on file  Social History Narrative   Not on file   Social Determinants of Health   Financial Resource Strain: Not on file  Food Insecurity: Not on file  Transportation Needs: Not on file  Physical Activity: Not on file  Stress: Not on file  Social Connections: Not on file     Family History: The patient's family history is not on file.  ROS:   Please see the history of present illness.     All other systems reviewed and are negative.  EKGs/Labs/Other Studies Reviewed:    The following studies were reviewed today:   EKG:  03/12/2021- The ekg ordered today demonstrates sinus rhythm, rate 69, left axis deviation, poor R wave progression   Recent Labs: 10/18/2020: TSH 1.288 05/19/2021: B Natriuretic Peptide 2,171.9 05/20/2021: Hemoglobin 12.1; Platelets 169 06/11/2021: ALT 23; BUN 27; Creatinine, Ser 1.02; Magnesium 1.5; Potassium 4.4; Sodium 145  Recent Lipid Panel    Component Value Date/Time   CHOL 162 10/18/2020 0208   TRIG 148 10/18/2020 0208   HDL 53 10/18/2020 0208   CHOLHDL 3.1 10/18/2020 0208   VLDL 30 10/18/2020 0208   LDLCALC 79 10/18/2020 0208    Physical Exam:     VS:  BP 140/82   Pulse 79   Wt 119 lb (54 kg)   SpO2 99%   BMI 21.77 kg/m     Wt Readings from Last 3 Encounters:  07/01/21 119 lb (54 kg)  06/03/21 117 lb 6.4 oz (53.3 kg)  05/21/21 112 lb 4.8 oz (50.9 kg)     GEN:  in no acute distress HEENT: Normal NECK:+ JVD; No carotid bruits CARDIAC: RRR, 2/6 systolic murmur, no rubs, no gallops RESPIRATORY: Scattered crackles ABDOMEN: Soft, non-tender, non-distended MUSCULOSKELETAL:  1+ edema; No deformity  SKIN: Warm and dry NEUROLOGIC:  Alert and oriented x 3 PSYCHIATRIC:  Normal affect   ASSESSMENT:    1. Chronic combined systolic and diastolic heart failure (HCC)   2. Persistent atrial fibrillation ([place])   3.  AKI (acute kidney injury) ([place])   4. Primary hypertension      PLAN:    Chronic combined systolic and diastolic heart failure:She was admitted to [facility] on 12/02/2020 with atrial fibrillation with RVR and new onset systolic heart failure, EF 25 to 30%.  LHC showed normal coronary arteries.  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  Repeat echocardiogram on 02/09/2021 showed LVEF [id number], grade 2 diastolic dysfunction, normal RV function, mild left atrial dilatation, moderate right atrial dilatation, mild MR, moderate to severe TR. [name], Farxiga were discontinued during recent hospitalization 04/2021.  Held given recent AKI.  We will recheck BMP, if stable renal function/potassium, plan to add losartan -Continue Toprol-XL 50 mg daily -Increase Lasix to 40 mg daily.  Take potassium 20 mEq daily.  Recommended monitoring daily weights and call if gains more than 3 pounds in 1 day or 5 pounds in 1 week          Persistent atrial fibrillation: Presented to [facility] on 12/02/2020 with A. fib with RVR.  CHA2DS2-VASc 5 (CHF, hypertension, age x2, female).  Underwent successful TEE/cardioversion on 12/05/2020 -Continue amiodarone 200 mg daily -Continue Eliquis 2.5 mg twice daily (age greater than 80, weight less than 60 kg).    -Continue Toprol-XL 50 mg daily  Hypertension: Continue metoprolol as above.  Mildly elevated in clinic today, plan to add back losartan if stable renal function/potassium.  AKI: Creatinine 1.74 on 05/27/2021.  Lasix was held with improvement to creatinine 1.48 on 8/9.  Restarting Lasix 5 days/week with improvement in creatinine of 1.0 on 8/18.  We will recheck BMP  RTC in 6-8 weeks  Medication Adjustments/Labs and Tests Ordered: Current medicines are reviewed at length with the patient today.  Concerns regarding medicines are outlined above.  Orders Placed This Encounter  Procedures   Brain natriuretic peptide   Magnesium   Basic metabolic panel     Meds ordered this encounter  Medications   furosemide (LASIX) 40 MG tablet    Sig: Take 1 tablet (40 mg total) by mouth daily.    Dispense:  90 tablet    Refill:  3   Potassium Chloride ER 20 MEQ TBCR    Sig: Take 20 mEq by mouth daily.    Dispense:  90 tablet    Refill:  3      Patient Instructions  Medication Instructions:  INCREASE furosemide (Lasix) to 40 mg daily INCREASE potassium to 20 meq daily  *If you need a refill on your cardiac medications before your next appointment, please call your pharmacy*   Lab Work: BMET, Mag, BNP today  If you have labs (blood work) drawn today and your tests are completely normal, you will receive your results only by: [place] (if you have MyChart) OR A paper copy in the mail If you have any lab test that is abnormal or we need to change your treatment, we will call you to review the results.   Follow-Up: At [facility], you and your health needs are our priority.  As part of our continuing mission to provide you with exceptional heart care, we have created designated Provider Care Teams.  These Care Teams include your primary Cardiologist (physician) and Advanced Practice Providers (APPs -  Physician Assistants and Nurse Practitioners) who all work together to provide  you with the care you need, when you need it.  We recommend signing up for the patient portal called "MyChart".  Sign up information is provided on this After Visit Summary.  MyChart is used to connect with patients for Virtual Visits (Telemedicine).  Patients are able to view lab/test results, encounter notes, upcoming appointments, etc.  Non-urgent messages can be sent to your provider as well.   To learn more about what you can do with MyChart, go to [email or web address].    Your next appointment:   6 week(s)  The format for your next appointment:   In Person  Provider:   Oswaldo Milian, MD      Signed, Donato Heinz, MD  07/01/2021 3:25 PM    [place]

## 2021-07-01 ENCOUNTER — Ambulatory Visit (INDEPENDENT_AMBULATORY_CARE_PROVIDER_SITE_OTHER): Payer: Medicare Other | Admitting: Cardiology

## 2021-07-01 ENCOUNTER — Other Ambulatory Visit: Payer: Self-pay

## 2021-07-01 VITALS — BP 140/82 | HR 79 | Wt 119.0 lb

## 2021-07-01 DIAGNOSIS — I5042 Chronic combined systolic (congestive) and diastolic (congestive) heart failure: Secondary | ICD-10-CM | POA: Diagnosis not present

## 2021-07-01 DIAGNOSIS — I4819 Other persistent atrial fibrillation: Secondary | ICD-10-CM | POA: Diagnosis not present

## 2021-07-01 DIAGNOSIS — N179 Acute kidney failure, unspecified: Secondary | ICD-10-CM

## 2021-07-01 DIAGNOSIS — I10 Essential (primary) hypertension: Secondary | ICD-10-CM

## 2021-07-01 MED ORDER — POTASSIUM CHLORIDE ER 20 MEQ PO TBCR: 20.0000 meq | EXTENDED_RELEASE_TABLET | Freq: Every day | ORAL | 3 refills | Status: DC

## 2021-07-01 MED ORDER — FUROSEMIDE 40 MG PO TABS: 40.0000 mg | ORAL_TABLET | Freq: Every day | ORAL | 3 refills | Status: DC

## 2021-07-01 NOTE — Patient Instructions (Signed)
Medication Instructions:  INCREASE furosemide (Lasix) to 40 mg daily INCREASE potassium to 20 meq daily  *If you need a refill on your cardiac medications before your next appointment, please call your pharmacy*   Lab Work: BMET, Mag, BNP today  If you have labs (blood work) drawn today and your tests are completely normal, you will receive your results only by: [place] (if you have MyChart) OR A paper copy in the mail If you have any lab test that is abnormal or we need to change your treatment, we will call you to review the results.   Follow-Up: At [facility], you and your health needs are our priority.  As part of our continuing mission to provide you with exceptional heart care, we have created designated Provider Care Teams.  These Care Teams include your primary Cardiologist (physician) and Advanced Practice Providers (APPs -  Physician Assistants and Nurse Practitioners) who all work together to provide you with the care you need, when you need it.  We recommend signing up for the patient portal called "MyChart".  Sign up information is provided on this After Visit Summary.  MyChart is used to connect with patients for Virtual Visits (Telemedicine).  Patients are able to view lab/test results, encounter notes, upcoming appointments, etc.  Non-urgent messages can be sent to your provider as well.   To learn more about what you can do with MyChart, go to [email or web address].    Your next appointment:   6 week(s)  The format for your next appointment:   In Person  Provider:   Oswaldo Milian, MD

## 2021-07-02 ENCOUNTER — Other Ambulatory Visit: Payer: Self-pay | Admitting: *Deleted

## 2021-07-02 ENCOUNTER — Ambulatory Visit
Admission: RE | Admit: 2021-07-02 | Discharge: 2021-07-02 | Disposition: A | Discharge status: Home / Self Care | Payer: Medicare Other | Source: Ambulatory Visit | Attending: Cardiology | Admitting: Cardiology

## 2021-07-02 DIAGNOSIS — R790 Abnormal level of blood mineral: Secondary | ICD-10-CM

## 2021-07-02 DIAGNOSIS — R0602 Shortness of breath: Secondary | ICD-10-CM

## 2021-07-02 DIAGNOSIS — Z79899 Other long term (current) drug therapy: Secondary | ICD-10-CM

## 2021-07-02 LAB — BRAIN NATRIURETIC PEPTIDE: BNP: 1760.3 pg/mL — ABNORMAL HIGH (ref 0.0–100.0)

## 2021-07-02 LAB — BASIC METABOLIC PANEL
BUN/Creatinine Ratio: 17 (ref 12–28)
BUN: 25 mg/dL (ref 8–27)
CO2: 18 mmol/L — ABNORMAL LOW (ref 20–29)
Calcium: 9.2 mg/dL (ref 8.7–10.3)
Chloride: 112 mmol/L — ABNORMAL HIGH (ref 96–106)
Creatinine, Ser: 1.45 mg/dL — ABNORMAL HIGH (ref 0.57–1.00)
Glucose: 96 mg/dL (ref 65–99)
Potassium: 4.1 mmol/L (ref 3.5–5.2)
Sodium: 148 mmol/L — ABNORMAL HIGH (ref 134–144)
eGFR: 36 mL/min/{1.73_m2} — ABNORMAL LOW (ref >59)

## 2021-07-02 LAB — MAGNESIUM: Magnesium: 1.4 mg/dL — ABNORMAL LOW (ref 1.6–2.3)

## 2021-07-10 LAB — BASIC METABOLIC PANEL
BUN/Creatinine Ratio: 16 (ref 12–28)
BUN: 22 mg/dL (ref 8–27)
CO2: 19 mmol/L — ABNORMAL LOW (ref 20–29)
Calcium: 9 mg/dL (ref 8.7–10.3)
Chloride: 109 mmol/L — ABNORMAL HIGH (ref 96–106)
Creatinine, Ser: 1.35 mg/dL — ABNORMAL HIGH (ref 0.57–1.00)
Glucose: 88 mg/dL (ref 65–99)
Potassium: 4 mmol/L (ref 3.5–5.2)
Sodium: 144 mmol/L (ref 134–144)
eGFR: 39 mL/min/{1.73_m2} — ABNORMAL LOW (ref >59)

## 2021-07-10 LAB — MAGNESIUM: Magnesium: 1.6 mg/dL (ref 1.6–2.3)

## 2021-07-17 ENCOUNTER — Other Ambulatory Visit: Payer: Self-pay

## 2021-07-17 ENCOUNTER — Telehealth: Payer: Self-pay | Admitting: Cardiology

## 2021-07-17 ENCOUNTER — Inpatient Hospital Stay (HOSPITAL_COMMUNITY)
Admission: EM | Admit: 2021-07-17 | Discharge: 2021-07-23 | DRG: 291 | Disposition: A | Discharge status: Hospice | Payer: Medicare Other | Attending: Student | Admitting: Student

## 2021-07-17 ENCOUNTER — Emergency Department (HOSPITAL_COMMUNITY): Payer: Medicare Other

## 2021-07-17 ENCOUNTER — Inpatient Hospital Stay (HOSPITAL_COMMUNITY): Payer: Medicare Other

## 2021-07-17 DIAGNOSIS — K8689 Other specified diseases of pancreas: Secondary | ICD-10-CM | POA: Diagnosis present

## 2021-07-17 DIAGNOSIS — R140 Abdominal distension (gaseous): Secondary | ICD-10-CM

## 2021-07-17 DIAGNOSIS — E871 Hypo-osmolality and hyponatremia: Secondary | ICD-10-CM | POA: Diagnosis present

## 2021-07-17 DIAGNOSIS — R188 Other ascites: Secondary | ICD-10-CM | POA: Diagnosis present

## 2021-07-17 DIAGNOSIS — I480 Paroxysmal atrial fibrillation: Secondary | ICD-10-CM | POA: Diagnosis not present

## 2021-07-17 DIAGNOSIS — Z20822 Contact with and (suspected) exposure to covid-19: Secondary | ICD-10-CM | POA: Diagnosis present

## 2021-07-17 DIAGNOSIS — I4819 Other persistent atrial fibrillation: Secondary | ICD-10-CM | POA: Diagnosis present

## 2021-07-17 DIAGNOSIS — R7689 Other specified abnormal immunological findings in serum: Secondary | ICD-10-CM

## 2021-07-17 DIAGNOSIS — E876 Hypokalemia: Secondary | ICD-10-CM | POA: Diagnosis present

## 2021-07-17 DIAGNOSIS — R531 Weakness: Secondary | ICD-10-CM | POA: Diagnosis not present

## 2021-07-17 DIAGNOSIS — Z515 Encounter for palliative care: Secondary | ICD-10-CM

## 2021-07-17 DIAGNOSIS — E43 Unspecified severe protein-calorie malnutrition: Secondary | ICD-10-CM | POA: Diagnosis present

## 2021-07-17 DIAGNOSIS — I159 Secondary hypertension, unspecified: Secondary | ICD-10-CM

## 2021-07-17 DIAGNOSIS — R778 Other specified abnormalities of plasma proteins: Secondary | ICD-10-CM

## 2021-07-17 DIAGNOSIS — I110 Hypertensive heart disease with heart failure: Secondary | ICD-10-CM

## 2021-07-17 DIAGNOSIS — I428 Other cardiomyopathies: Secondary | ICD-10-CM | POA: Diagnosis present

## 2021-07-17 DIAGNOSIS — N1832 Chronic kidney disease, stage 3b: Secondary | ICD-10-CM | POA: Diagnosis present

## 2021-07-17 DIAGNOSIS — R601 Generalized edema: Secondary | ICD-10-CM | POA: Diagnosis present

## 2021-07-17 DIAGNOSIS — E7800 Pure hypercholesterolemia, unspecified: Secondary | ICD-10-CM | POA: Diagnosis present

## 2021-07-17 DIAGNOSIS — K435 Parastomal hernia without obstruction or  gangrene: Secondary | ICD-10-CM | POA: Diagnosis present

## 2021-07-17 DIAGNOSIS — Z66 Do not resuscitate: Secondary | ICD-10-CM | POA: Diagnosis present

## 2021-07-17 DIAGNOSIS — I071 Rheumatic tricuspid insufficiency: Secondary | ICD-10-CM | POA: Diagnosis present

## 2021-07-17 DIAGNOSIS — E8770 Fluid overload, unspecified: Secondary | ICD-10-CM | POA: Diagnosis not present

## 2021-07-17 DIAGNOSIS — K7460 Unspecified cirrhosis of liver: Secondary | ICD-10-CM | POA: Diagnosis present

## 2021-07-17 DIAGNOSIS — I5043 Acute on chronic combined systolic (congestive) and diastolic (congestive) heart failure: Secondary | ICD-10-CM | POA: Diagnosis present

## 2021-07-17 DIAGNOSIS — R0602 Shortness of breath: Secondary | ICD-10-CM | POA: Diagnosis not present

## 2021-07-17 DIAGNOSIS — N179 Acute kidney failure, unspecified: Secondary | ICD-10-CM | POA: Diagnosis present

## 2021-07-17 DIAGNOSIS — I130 Hypertensive heart and chronic kidney disease with heart failure and stage 1 through stage 4 chronic kidney disease, or unspecified chronic kidney disease: Principal | ICD-10-CM | POA: Diagnosis present

## 2021-07-17 DIAGNOSIS — R0789 Other chest pain: Secondary | ICD-10-CM | POA: Diagnosis not present

## 2021-07-17 DIAGNOSIS — R7989 Other specified abnormal findings of blood chemistry: Secondary | ICD-10-CM

## 2021-07-17 DIAGNOSIS — J9811 Atelectasis: Secondary | ICD-10-CM | POA: Diagnosis present

## 2021-07-17 DIAGNOSIS — I5041 Acute combined systolic (congestive) and diastolic (congestive) heart failure: Secondary | ICD-10-CM

## 2021-07-17 DIAGNOSIS — Z933 Colostomy status: Secondary | ICD-10-CM

## 2021-07-17 DIAGNOSIS — R509 Fever, unspecified: Secondary | ICD-10-CM | POA: Diagnosis not present

## 2021-07-17 DIAGNOSIS — R768 Other specified abnormal immunological findings in serum: Secondary | ICD-10-CM

## 2021-07-17 DIAGNOSIS — R52 Pain, unspecified: Secondary | ICD-10-CM

## 2021-07-17 DIAGNOSIS — I5031 Acute diastolic (congestive) heart failure: Secondary | ICD-10-CM | POA: Diagnosis not present

## 2021-07-17 DIAGNOSIS — R0902 Hypoxemia: Secondary | ICD-10-CM | POA: Diagnosis present

## 2021-07-17 DIAGNOSIS — I272 Pulmonary hypertension, unspecified: Secondary | ICD-10-CM | POA: Diagnosis present

## 2021-07-17 DIAGNOSIS — T502X5A Adverse effect of carbonic-anhydrase inhibitors, benzothiadiazides and other diuretics, initial encounter: Secondary | ICD-10-CM | POA: Diagnosis present

## 2021-07-17 DIAGNOSIS — M19011 Primary osteoarthritis, right shoulder: Secondary | ICD-10-CM | POA: Diagnosis present

## 2021-07-17 DIAGNOSIS — Z7189 Other specified counseling: Secondary | ICD-10-CM | POA: Diagnosis not present

## 2021-07-17 DIAGNOSIS — I5082 Biventricular heart failure: Secondary | ICD-10-CM | POA: Diagnosis present

## 2021-07-17 DIAGNOSIS — Z6820 Body mass index (BMI) 20.0-20.9, adult: Secondary | ICD-10-CM

## 2021-07-17 DIAGNOSIS — K760 Fatty (change of) liver, not elsewhere classified: Secondary | ICD-10-CM | POA: Diagnosis present

## 2021-07-17 DIAGNOSIS — I50814 Right heart failure due to left heart failure: Secondary | ICD-10-CM | POA: Diagnosis not present

## 2021-07-17 DIAGNOSIS — B182 Chronic viral hepatitis C: Secondary | ICD-10-CM | POA: Diagnosis not present

## 2021-07-17 DIAGNOSIS — I10 Essential (primary) hypertension: Secondary | ICD-10-CM | POA: Diagnosis present

## 2021-07-17 DIAGNOSIS — Z9049 Acquired absence of other specified parts of digestive tract: Secondary | ICD-10-CM

## 2021-07-17 DIAGNOSIS — R627 Adult failure to thrive: Secondary | ICD-10-CM | POA: Diagnosis present

## 2021-07-17 LAB — COMPREHENSIVE METABOLIC PANEL
ALT: 16 U/L (ref 0–44)
AST: 34 U/L (ref 15–41)
Albumin: 3.3 g/dL — ABNORMAL LOW (ref 3.5–5.0)
Alkaline Phosphatase: 86 U/L (ref 38–126)
Anion gap: 9 (ref 5–15)
BUN: 21 mg/dL (ref 8–23)
CO2: 21 mmol/L — ABNORMAL LOW (ref 22–32)
Calcium: 9 mg/dL (ref 8.9–10.3)
Chloride: 112 mmol/L — ABNORMAL HIGH (ref 98–111)
Creatinine, Ser: 1.3 mg/dL — ABNORMAL HIGH (ref 0.44–1.00)
GFR, Estimated: 41 mL/min — ABNORMAL LOW (ref >60)
Glucose, Bld: 86 mg/dL (ref 70–99)
Potassium: 4.3 mmol/L (ref 3.5–5.1)
Sodium: 142 mmol/L (ref 135–145)
Total Bilirubin: 1.3 mg/dL — ABNORMAL HIGH (ref 0.3–1.2)
Total Protein: 6 g/dL — ABNORMAL LOW (ref 6.5–8.1)

## 2021-07-17 LAB — CBC WITH DIFFERENTIAL/PLATELET
Abs Immature Granulocytes: 0.08 10*3/uL — ABNORMAL HIGH (ref 0.00–0.07)
Basophils Absolute: 0 10*3/uL (ref 0.0–0.1)
Basophils Relative: 0 %
Eosinophils Absolute: 0 10*3/uL (ref 0.0–0.5)
Eosinophils Relative: 0 %
HCT: 32.1 % — ABNORMAL LOW (ref 36.0–46.0)
Hemoglobin: 10.2 g/dL — ABNORMAL LOW (ref 12.0–15.0)
Immature Granulocytes: 1 %
Lymphocytes Relative: 6 %
Lymphs Abs: 0.7 10*3/uL (ref 0.7–4.0)
MCH: 30 pg (ref 26.0–34.0)
MCHC: 31.8 g/dL (ref 30.0–36.0)
MCV: 94.4 fL (ref 80.0–100.0)
Monocytes Absolute: 1.1 10*3/uL — ABNORMAL HIGH (ref 0.1–1.0)
Monocytes Relative: 10 %
Neutro Abs: 9.1 10*3/uL — ABNORMAL HIGH (ref 1.7–7.7)
Neutrophils Relative %: 83 %
Platelets: 97 10*3/uL — ABNORMAL LOW (ref 150–400)
RBC: 3.4 MIL/uL — ABNORMAL LOW (ref 3.87–5.11)
RDW: 16.7 % — ABNORMAL HIGH (ref 11.5–15.5)
WBC: 10.9 10*3/uL — ABNORMAL HIGH (ref 4.0–10.5)
nRBC: 0 % (ref 0.0–0.2)

## 2021-07-17 LAB — RESP PANEL BY RT-PCR (FLU A&B, COVID) ARPGX2
Influenza A by PCR: NEGATIVE
Influenza B by PCR: NEGATIVE
SARS Coronavirus 2 by RT PCR: NEGATIVE

## 2021-07-17 LAB — LIPASE, BLOOD: Lipase: 39 U/L (ref 11–51)

## 2021-07-17 LAB — TROPONIN I (HIGH SENSITIVITY)
Troponin I (High Sensitivity): 44 ng/L — ABNORMAL HIGH (ref <18)
Troponin I (High Sensitivity): 58 ng/L — ABNORMAL HIGH (ref <18)

## 2021-07-17 LAB — BRAIN NATRIURETIC PEPTIDE
B Natriuretic Peptide: 2777.9 pg/mL — ABNORMAL HIGH (ref 0.0–100.0)
B Natriuretic Peptide: 4004.4 pg/mL — ABNORMAL HIGH (ref 0.0–100.0)

## 2021-07-17 LAB — PROTIME-INR
INR: 1.5 — ABNORMAL HIGH (ref 0.8–1.2)
Prothrombin Time: 17.8 seconds — ABNORMAL HIGH (ref 11.4–15.2)

## 2021-07-17 LAB — MAGNESIUM: Magnesium: 1.5 mg/dL — ABNORMAL LOW (ref 1.7–2.4)

## 2021-07-17 LAB — FERRITIN: Ferritin: 85 ng/mL (ref 11–307)

## 2021-07-17 LAB — TSH: TSH: 2.581 u[IU]/mL (ref 0.350–4.500)

## 2021-07-17 MED ORDER — VITAMIN B-12 1000 MCG PO TABS
1000.0000 ug | ORAL_TABLET | Freq: Every day | ORAL | Status: DC
  Administered 2021-07-17 – 2021-07-22 (×6): 1000 ug via ORAL
  Filled 2021-07-17 (×6): qty 1

## 2021-07-17 MED ORDER — ATORVASTATIN CALCIUM 10 MG PO TABS
20.0000 mg | ORAL_TABLET | Freq: Every evening | ORAL | Status: DC
  Administered 2021-07-17 – 2021-07-21 (×5): 20 mg via ORAL
  Filled 2021-07-17 (×5): qty 2

## 2021-07-17 MED ORDER — FUROSEMIDE 10 MG/ML IJ SOLN
40.0000 mg | Freq: Once | INTRAMUSCULAR | Status: AC
  Administered 2021-07-17: 40 mg via INTRAVENOUS
  Filled 2021-07-17: qty 4

## 2021-07-17 MED ORDER — COLCHICINE 0.6 MG PO TABS
0.6000 mg | ORAL_TABLET | Freq: Every day | ORAL | Status: DC
  Administered 2021-07-19 – 2021-07-20 (×2): 0.6 mg via ORAL
  Filled 2021-07-17 (×2): qty 1

## 2021-07-17 MED ORDER — FUROSEMIDE 10 MG/ML IJ SOLN
40.0000 mg | Freq: Four times a day (QID) | INTRAMUSCULAR | Status: DC
Start: 2021-07-17 — End: 2021-07-17

## 2021-07-17 MED ORDER — FUROSEMIDE 10 MG/ML IJ SOLN
40.0000 mg | Freq: Two times a day (BID) | INTRAMUSCULAR | Status: DC
  Administered 2021-07-18 (×3): 40 mg via INTRAVENOUS
  Filled 2021-07-17 (×3): qty 4

## 2021-07-17 MED ORDER — MAGNESIUM OXIDE 400 MG PO TABS
400.0000 mg | ORAL_TABLET | Freq: Every day | ORAL | Status: DC
  Filled 2021-07-17: qty 1

## 2021-07-17 MED ORDER — AMIODARONE HCL 200 MG PO TABS
200.0000 mg | ORAL_TABLET | Freq: Every day | ORAL | Status: DC
  Administered 2021-07-18 – 2021-07-22 (×5): 200 mg via ORAL
  Filled 2021-07-17 (×6): qty 1

## 2021-07-17 MED ORDER — ACETAMINOPHEN 500 MG PO TABS
1000.0000 mg | ORAL_TABLET | Freq: Once | ORAL | Status: AC
  Administered 2021-07-17: 1000 mg via ORAL
  Filled 2021-07-17: qty 2

## 2021-07-17 MED ORDER — METOPROLOL SUCCINATE ER 100 MG PO TB24
100.0000 mg | ORAL_TABLET | Freq: Every day | ORAL | Status: DC
  Administered 2021-07-18 – 2021-07-22 (×5): 100 mg via ORAL
  Filled 2021-07-17 (×5): qty 1

## 2021-07-17 MED ORDER — COLCHICINE 0.6 MG PO TABS
0.6000 mg | ORAL_TABLET | Freq: Every day | ORAL | Status: DC
  Filled 2021-07-17: qty 1

## 2021-07-17 MED ORDER — KETOROLAC TROMETHAMINE 15 MG/ML IJ SOLN: 15.0000 mg | Freq: Four times a day (QID) | INTRAMUSCULAR | Status: DC | PRN

## 2021-07-17 MED ORDER — ONDANSETRON HCL 4 MG/2ML IJ SOLN: 4.0000 mg | Freq: Four times a day (QID) | INTRAMUSCULAR | Status: DC | PRN

## 2021-07-17 MED ORDER — ALLOPURINOL 100 MG PO TABS
100.0000 mg | ORAL_TABLET | Freq: Every day | ORAL | Status: DC
  Administered 2021-07-18 – 2021-07-22 (×5): 100 mg via ORAL
  Filled 2021-07-17 (×5): qty 1

## 2021-07-17 MED ORDER — FOLIC ACID 1 MG PO TABS
1.0000 mg | ORAL_TABLET | Freq: Every morning | ORAL | Status: DC
  Administered 2021-07-18 – 2021-07-20 (×3): 1 mg via ORAL
  Filled 2021-07-17 (×4): qty 1

## 2021-07-17 MED ORDER — SODIUM CHLORIDE 0.9% FLUSH: 3.0000 mL | INTRAVENOUS | Status: DC | PRN

## 2021-07-17 MED ORDER — ALLOPURINOL 100 MG PO TABS
100.0000 mg | ORAL_TABLET | Freq: Every day | ORAL | Status: DC
  Filled 2021-07-17: qty 1

## 2021-07-17 MED ORDER — ACETAMINOPHEN 325 MG PO TABS
650.0000 mg | ORAL_TABLET | ORAL | Status: DC | PRN
  Administered 2021-07-19 – 2021-07-21 (×3): 650 mg via ORAL
  Filled 2021-07-17 (×3): qty 2

## 2021-07-17 MED ORDER — POTASSIUM CHLORIDE CRYS ER 10 MEQ PO TBCR
20.0000 meq | EXTENDED_RELEASE_TABLET | ORAL | Status: DC
  Administered 2021-07-17 – 2021-07-21 (×3): 20 meq via ORAL
  Filled 2021-07-17 (×5): qty 2

## 2021-07-17 MED ORDER — SODIUM CHLORIDE 0.9 % IV SOLN: 250.0000 mL | INTRAVENOUS | Status: DC | PRN

## 2021-07-17 MED ORDER — PANTOPRAZOLE SODIUM 40 MG PO TBEC
40.0000 mg | DELAYED_RELEASE_TABLET | Freq: Two times a day (BID) | ORAL | Status: DC
  Administered 2021-07-17 – 2021-07-22 (×10): 40 mg via ORAL
  Filled 2021-07-17 (×10): qty 1

## 2021-07-17 MED ORDER — SPIRONOLACTONE 25 MG PO TABS
25.0000 mg | ORAL_TABLET | Freq: Every day | ORAL | Status: DC
Start: 2021-07-17 — End: 2021-07-22
  Administered 2021-07-17 – 2021-07-22 (×6): 25 mg via ORAL
  Filled 2021-07-17 (×7): qty 1

## 2021-07-17 MED ORDER — THIAMINE HCL 100 MG PO TABS
100.0000 mg | ORAL_TABLET | Freq: Every day | ORAL | Status: DC
  Administered 2021-07-18 – 2021-07-22 (×5): 100 mg via ORAL
  Filled 2021-07-17 (×7): qty 1

## 2021-07-17 MED ORDER — IOHEXOL 300 MG/ML  SOLN
100.0000 mL | Freq: Once | INTRAMUSCULAR | Status: AC | PRN
  Administered 2021-07-17: 100 mL via INTRAVENOUS

## 2021-07-17 MED ORDER — APIXABAN 2.5 MG PO TABS
2.5000 mg | ORAL_TABLET | Freq: Two times a day (BID) | ORAL | Status: DC
  Administered 2021-07-17 – 2021-07-22 (×10): 2.5 mg via ORAL
  Filled 2021-07-17 (×10): qty 1

## 2021-07-17 MED ORDER — AMIODARONE HCL 200 MG PO TABS: 200.0000 mg | ORAL_TABLET | Freq: Every day | ORAL | Status: DC

## 2021-07-17 MED ORDER — MAGNESIUM OXIDE -MG SUPPLEMENT 400 (240 MG) MG PO TABS
400.0000 mg | ORAL_TABLET | Freq: Every day | ORAL | Status: DC
  Administered 2021-07-17 – 2021-07-22 (×6): 400 mg via ORAL
  Filled 2021-07-17 (×6): qty 1

## 2021-07-17 MED ORDER — SODIUM CHLORIDE 0.9% FLUSH
3.0000 mL | Freq: Two times a day (BID) | INTRAVENOUS | Status: DC
Start: 2021-07-17 — End: 2021-07-22
  Administered 2021-07-17 – 2021-07-22 (×10): 3 mL via INTRAVENOUS

## 2021-07-17 NOTE — Consult Note (Signed)
Cardiology Consultation:   Patient ID: Maria Travis MRN: [id number]; DOB: 1941/01/21  Admit date: 07/17/2021 Date of Consult: 07/17/2021  PCP:  Seward Carol, MD   [facility] Providers Cardiologist:  Donato Heinz, MD    Patient Profile:   Maria Travis is a 80 y.o. 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 Vanita Panda.  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  She was admitted in February 2022 with atrial fibrillation with RVR and new onset systolic heart failure with an LVEF of 25 to 30%.  Left heart cath showed normal coronaries.  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  She was discharged on amiodarone and repeat echocardiogram on 02/09/2021 showed improved EF to 74%, grade 2 diastolic dysfunction, mild MR, moderate to severe TR, mild left atrial dilation, and moderate right atrial dilation.  She was admitted in July for acute on chronic diastolic heart failure.  She responded to IV diuresis and was discharged on p.o. Lasix 40 mg daily.  Maria Travis was discontinued.  She was found to have worsening renal function with a creatinine between 1.16-1.74.  Lasix was held.  She was recently seen by Dr. Gardiner Rhyme in clinic on 07/01/2021.  She reported shortness of breath with occasional chest pain, but no weight increase at home.  He reinstated Lasix 40 mg daily with potassium supplementation but due to Cr 1.45 and Na 148 dropped to every other day  She is continued on Toprol 50 mg daily.  Maria Travis and  [place] had been discontinued due to renal insufficiency.  He plan to check labs and was hopeful to add losartan back to her goal-directed medical therapy.  She called our office today reporting swelling in her feet and ankles but no weight gain.  With further questioning, she also reported abdominal swelling that was painful to touch.  She also reported that her colostomy was "huge" today.  She has been drinking a lot of soups at home.  Dr. Nechama Guard directed her to the ER for further evaluation.  On arrival to the ER, she was hypoxic requiring supplemental O2.  EDP noted grossly distended abdomen with concern for ascites and bilateral lower extremity edema.  Cardiology was consulted for heart failure exacerbation. The pt's daughter present    Answers some questions   Says her mom is in independent living at [facility]      She had fallen recently   Hurt L side   has been weak  Appetite not too good    Today she went to check in on her and colostomy had burst   She thinks her mom was just too weak to take care of it    Says that over past few days when she has spoken with her mom she has been very SOB    BNP 2778 sCr 1.30 K 4.3 Albumin 3.3 Mg 1.5 HS troponin 44 --> 58  CXR does not show congestion or pleural effusion.      Past Medical History:  Diagnosis Date   Atrial fibrillation (HCC)    Chronic diastolic (congestive) heart failure (HCC)    Diverticulitis    High  cholesterol    Hypertension     Past Surgical History:  Procedure Laterality Date   CARDIOVERSION N/A 12/05/2020   Procedure: CARDIOVERSION;  Surgeon: Sanda Klein, MD;  Location: [facility] ENDOSCOPY;  Service: Cardiovascular;  Laterality: N/A;   CHOLECYSTECTOMY     COLOSTOMY     RIGHT/LEFT HEART CATH AND CORONARY ANGIOGRAPHY N/A 12/04/2020   Procedure: RIGHT/LEFT HEART CATH AND CORONARY ANGIOGRAPHY;  Surgeon: Belva Crome, MD;  Location: [place] CV LAB;  Service: Cardiovascular;  Laterality: N/A;   TEE WITHOUT  CARDIOVERSION N/A 12/05/2020   Procedure: TRANSESOPHAGEAL ECHOCARDIOGRAM (TEE);  Surgeon: Sanda Klein, MD;  Location: [facility] ENDOSCOPY;  Service: Cardiovascular;  Laterality: N/A;     Home Medications:  Prior to Admission medications   Medication Sig Start Date End Date Taking? Authorizing Provider  acetaminophen (TYLENOL) 325 MG tablet Take 650 mg by mouth every 6 (six) hours as needed for mild pain or headache.   Yes [provider]  allopurinol (ZYLOPRIM) 100 MG tablet Take 100 mg by mouth daily.   Yes [provider]  amiodarone (PACERONE) 200 MG tablet Take 1 tablet (200 mg total) by mouth daily. 12/08/20  Yes Amponsah, Charisse March, MD  apixaban (ELIQUIS) 2.5 MG TABS tablet Take 2.5 mg by mouth 2 (two) times daily.   Yes [provider]  atorvastatin (LIPITOR) 20 MG tablet Take 20 mg by mouth every evening.   Yes [provider]  colchicine 0.6 MG tablet Take 0.6 mg by mouth daily.   Yes [provider]  folic acid (FOLVITE) 1 MG tablet Take 1 mg by mouth in the morning.   Yes [provider]  furosemide (LASIX) 40 MG tablet Take 1 tablet (40 mg total) by mouth daily. Patient taking differently: Take 40 mg by mouth every other day. 07/01/21  Yes Donato Heinz, MD  Loratadine 10 MG CAPS Take 10 mg by mouth daily.   Yes [provider]  magnesium oxide (MAG-OX) 400 MG tablet Take 1 tablet (400 mg total) by mouth daily. 03/31/21  Yes Donato Heinz, MD  metoprolol succinate (TOPROL-XL) 100 MG 24 hr tablet Take 0.5 tablets (50 mg total) by mouth daily. Take with or immediately following a meal. Patient taking differently: Take 100 mg by mouth daily. 01/09/21  Yes Cleaver, Jossie Ng, NP  pantoprazole (PROTONIX) 40 MG tablet Take 1 tablet (40 mg total) by mouth 2 (two) times daily. 05/21/21  Yes Regalado, Belkys A, MD  Potassium Chloride ER 20 MEQ TBCR Take 20 mEq by mouth daily. Patient taking differently: Take 20 mEq by  mouth every other day. Take with furosemide 07/01/21  Yes Donato Heinz, MD  thiamine (VITAMIN B-1) 100 MG tablet Take 100 mg by mouth daily.   Yes [provider]  vitamin B-12 1000 MCG tablet Take 1 tablet (1,000 mcg total) by mouth daily. 05/22/21  Yes Regalado, Belkys A, MD  spironolactone (ALDACTONE) 25 MG tablet Take 0.5 tablets (12.5 mg total) by mouth daily. ON HOLD AS OF 3/11 per Dr. Gardiner Rhyme, see phone note Patient not taking: No sig reported 01/02/21 05/19/21  Donato Heinz, MD    Inpatient Medications: Scheduled Meds:  magnesium oxide  400 mg Oral Daily   Continuous Infusions:  PRN Meds:   Allergies:   No Known Allergies  Social History:   Social History   Socioeconomic History   Marital status: Single    Spouse name: Not on file   Number of children: Not on  file   Years of education: Not on file   Highest education level: Not on file  Occupational History   Occupation: retired  Tobacco Use   Smoking status: Never   Smokeless tobacco: Never  Vaping Use   Vaping Use: Never used  Substance and Sexual Activity   Alcohol use: Not Currently    Comment: weekends   Drug use: No   Sexual activity: Not on file  Other Topics Concern   Not on file  Social History Narrative   Not on file   Social Determinants of Health   Financial Resource Strain: Not on file  Food Insecurity: Not on file  Transportation Needs: Not on file  Physical Activity: Not on file  Stress: Not on file  Social Connections: Not on file  Intimate Partner Violence: Not on file    Family History:   Defrerred   ROS:  Please see the history of present illness.   All other ROS reviewed and negative.     Physical Exam/Data:   Vitals:   07/17/21 1400 07/17/21 1415 07/17/21 1543 07/17/21 1930  BP: 140/87 (!) 152/88  (!) 166/90  Pulse: 78 76 77 78  Resp: (!) 22 19 20  (!) 24  Temp:      TempSrc:      SpO2: 98% 99% 95% 96%  Weight:      Height:       No  intake or output data in the 24 hours ending 07/17/21 1943 Last 3 Weights 07/17/2021 07/01/2021 06/03/2021  Weight (lbs) 119 lb 0.8 oz 119 lb 117 lb 6.4 oz  Weight (kg) 54 kg 53.978 kg 53.252 kg     Body mass index is 20.43 kg/m.  General:  Thin 80 yo in NAD    Winces with movement of L arm, deep breath   HEENT: normal Neck: no JVD Vascular: No carotid bruits; Distal pulses 2+ bilaterally Cardiac:  normal S1, S2; RRR; no murmur  Lungs:  clear to auscultation bilaterally, no wheezing, rhonchi or rales  Chest: L rib cage is tender to palpation   Abd: Sl distended   Colostomy wit hsome gas   Few BS   Tender to palpation   No rebound   Ext: Tr to 1+ edema Musculoskeletal:  No deformities,  L arm tender on movement  Deferred    Skin: warm and dry  Neuro:  CNs 2-12 intact, no focal abnormalities noted Psych:  Normal affect   EKG:  The EKG was personally reviewed and demonstrates:  Not done yet Telemetry:  Telemetry was personally reviewed and demonstrates:  SR    Relevant CV Studies:  Echo 02/09/21: 1. Left ventricular ejection fraction, by estimation, is 55%. The left  ventricle has normal function. The left ventricle has no regional wall  motion abnormalities. Left ventricular diastolic parameters are consistent  with Grade II diastolic dysfunction  (pseudonormalization).   2. Right ventricular systolic function is normal. The right ventricular  size is normal. There is moderately elevated pulmonary artery systolic  pressure. The estimated right ventricular systolic pressure is 42.5 mmHg.   3. Left atrial size was mildly dilated.   4. Right atrial size was moderately dilated.   5. The mitral valve is normal in structure. Mild mitral valve  regurgitation. No evidence of mitral stenosis. Moderate mitral annular  calcification.   6. The tricuspid valve is abnormal. Tricuspid valve regurgitation is  moderate to severe, there appears to be inadequate coaptation of the  valve.   7.  The  aortic valve is tricuspid. Aortic valve regurgitation is mild.  Mild to moderate aortic valve sclerosis/calcification is present, without  any evidence of aortic stenosis.   8. The inferior vena cava is dilated in size with >50% respiratory  variability, suggesting right atrial pressure of 8 mmHg.   Laboratory Data:  High Sensitivity Troponin:   Recent Labs  Lab 07/17/21 1454 07/17/21 1644  TROPONINIHS 44* 58*     Chemistry Recent Labs  Lab 07/17/21 1454  NA 142  K 4.3  CL 112*  CO2 21*  GLUCOSE 86  BUN 21  CREATININE 1.30*  CALCIUM 9.0  MG 1.5*  GFRNONAA 41*  ANIONGAP 9    Recent Labs  Lab 07/17/21 1454  PROT 6.0*  ALBUMIN 3.3*  AST 34  ALT 16  ALKPHOS 86  BILITOT 1.3*   Lipids No results for input(s): CHOL, TRIG, HDL, LABVLDL, LDLCALC, CHOLHDL in the last 168 hours.  Hematology Recent Labs  Lab 07/17/21 1454  WBC 10.9*  RBC 3.40*  HGB 10.2*  HCT 32.1*  MCV 94.4  MCH 30.0  MCHC 31.8  RDW 16.7*  PLT 97*   Thyroid No results for input(s): TSH, FREET4 in the last 168 hours.  BNP Recent Labs  Lab 07/17/21 1444  BNP 2,777.9*    DDimer No results for input(s): DDIMER in the last 168 hours.   Radiology/Studies:  CT ABDOMEN PELVIS W CONTRAST  Result Date: 07/17/2021 CLINICAL DATA:  Abdominal distension. EXAM: CT ABDOMEN AND PELVIS WITH CONTRAST TECHNIQUE: Multidetector CT imaging of the abdomen and pelvis was performed using the standard protocol following bolus administration of intravenous contrast. CONTRAST:  131mL OMNIPAQUE IOHEXOL 300 MG/ML  SOLN COMPARISON:  May 19, 2021 FINDINGS: Lower chest: Mild atelectasis is seen within the bilateral lung bases. There is a small right pleural effusion. Hepatobiliary: There is diffuse fatty infiltration of the liver parenchyma. No focal liver abnormality is seen. Status post cholecystectomy. No biliary dilatation. Pancreas: Pancreatic duct is visualized and measures 5 mm in diameter. This is stable in size when  compared to the prior study. The pancreatic parenchyma is otherwise unremarkable. Spleen: Normal in size without focal abnormality. Adrenals/Urinary Tract: Adrenal glands are unremarkable. Kidneys are normal in size, without renal calculi or hydronephrosis. A 1.5 cm diameter cyst is seen along the anterolateral aspect of the mid to lower right kidney. Bladder is unremarkable. Stomach/Bowel: Stomach is within normal limits. The appendix is not identified. Surgically anastomosed bowel is again seen along the midline of the lower abdomen and pelvis. This bowel loop is stable in caliber and is persistently dilated. Vascular/Lymphatic: Aortic atherosclerosis. No enlarged abdominal or pelvic lymph nodes. Reproductive: Uterus and bilateral adnexa are unremarkable. 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 This hernia contains fat, fluid and multiple loops of nondilated small bowel. A stable 8.4 cm x 4.4 cm ventral hernia is seen along the midline of the anterior pelvic wall, adjacent to the previously noted ostomy site. This also contains fat and nondilated small bowel loops. A moderate amount of abdominopelvic ascites is seen. Musculoskeletal: There is moderate to marked severity anasarca throughout the abdominal and pelvic walls. Multilevel degenerative changes are seen throughout the thoracolumbar spine with approximately 5 mm anterolisthesis of the L4 vertebral body on L5. IMPRESSION: 1. Small right pleural effusion. 2. Stable postoperative changes with a left lower quadrant ostomy site and large, stable parastomal hernia. 3. Stable ventral hernia along  the midline of the anterior pelvic wall, adjacent to the previously noted ostomy site. 4. Evidence of prior cholecystectomy. 5. Moderate severity abdominopelvic ascites. 6. Moderate to marked severity anasarca. 7. Aortic atherosclerosis. Aortic Atherosclerosis (ICD10-I70.0). Electronically Signed   By:  Virgina Norfolk M.D.   On: 07/17/2021 17:49   DG Chest Portable 1 View  Result Date: 07/17/2021 CLINICAL DATA:  Shortness of breath. EXAM: PORTABLE CHEST 1 VIEW COMPARISON:  July 02, 2021. FINDINGS: Stable cardiomegaly. Pneumothorax or pleural effusion is noted. Left lung is clear. Stable right basilar subsegmental atelectasis or scarring is noted. Bony thorax is unremarkable. IMPRESSION: Stable right basilar subsegmental atelectasis or scarring. Electronically Signed   By: Marijo Conception M.D.   On: 07/17/2021 15:23     Assessment and Plan:   Acute on chronic combined systolic and diastolic heart failure   Pt with LVEF 25 to 30% in Feb 2022  Normal coronary arteries   Tachy induced   Improved with rate/rhythm control    - Echo from 02/09/21 was  55%, grade 2 DD   Pt admitted in July with CHF  Diastolic   Meds adjusted    She was seen in clinic on 07/01/21 with some increased volume   Lasix increased to 40 daily but then pulled back to qod due to renal function and increased NA    Since that time pt has remained weak   Daughter says that edema increased over past few days  Since presentation, pt has received IV lasix and per daughter has put out a lot of urine   She is now 99% on RA     Recomm:   WOuld continue to follow  Give BID lasix      2  Troponin   Minimal elevation   Repeat x 1  Follow   Pt hypoxic with volume increase  3  A trial fibrillation   Remains in SR   Keep on amiodarone   Pharmacy to dose coumadin  4  HTN  BP is high    Keep on home meds   Follow for now    5  GI   Pt with diffuse abdominal pain  CT shows ascites but no acute findings   COntinue IV lasix   6 Thrombocytopenia   Plt 97K   Down from 169 in July 2022  Follow     7   Hx fall   Pt recently fell  [facility]  CXR did not comment on fx but she is very tender    ? Dispo after    May need more assistance.      Will continue to follow    -       For questions or updates,  please contact [place] Please consult [email or web address] for contact info under    Signed, Ledora Bottcher, [place]  07/17/2021 7:43 PM

## 2021-07-17 NOTE — ED Provider Notes (Signed)
I assumed care of the patient at signout.  Studies notable for ascites, no acute abdominal processes.  With worsening hepatic function, anasarca, fluid overload status I discussed her case with her cardiology colleagues.  Cardiology will follow as a consulting team patient admitted to the hospitalist service.   Carmin Muskrat, MD 07/17/21 2248

## 2021-07-17 NOTE — Telephone Encounter (Signed)
Spoke to daughter-aware of recommendations and verbalized understanding.

## 2021-07-17 NOTE — Telephone Encounter (Signed)
FYI--[name] is following up. She states she is about to call EMS to take her to the hospital and she wanted to inform Dr. Gardiner Rhyme.

## 2021-07-17 NOTE — Telephone Encounter (Signed)
Recommend going to ED for evaluation.

## 2021-07-17 NOTE — Telephone Encounter (Signed)
Spoke to daughter-EMS and there and will be transporting to the hospital.  Updated cardmaster

## 2021-07-17 NOTE — ED Provider Notes (Signed)
[facility] Provider Note   CSN: [id number] Arrival date & time: 07/17/21  1308     History Chief Complaint  Patient presents with   Weakness    [facility] is a 80 y.o. female.  Pt is a 80 yo female presenting for generalized weakness and sob. Patient admits to shortness of breath, difficulty speaking due to sob, and lower extremity swelling bilaterally that has been worsening over the past several weeks. Patient hypoxic on evaluation by EMS and placed on [place]. Currently sating at 94 % on 2 L. Hx of CHF on lasix 40 mg, previously every day, recently changed to every other day. Admits to new abdominal swelling and distension.   The history is provided by the patient. No language interpreter was used.  Weakness Associated symptoms: shortness of breath   Associated symptoms: no abdominal pain, no arthralgias, no chest pain, no cough, no dysuria, no fever, no seizures and no vomiting       Past Medical History:  Diagnosis Date   Atrial fibrillation (HCC)    Chronic diastolic (congestive) heart failure (HCC)    Diverticulitis    High cholesterol    Hypertension     Patient Active Problem List   Diagnosis Date Noted   Acute on chronic diastolic CHF (congestive heart failure) ([place]) 05/19/2021   Gastritis 05/19/2021   Acute HFrEF (heart failure with reduced ejection fraction) ([place])    Enterocolitis    Atrial fibrillation with RVR ([place]) 12/02/2020   Elevated troponin 10/18/2020   Hypertension 10/18/2020   Hyperlipidemia 10/18/2020   AKI (acute kidney injury) ([place]) 10/18/2020   Paroxysmal atrial fibrillation ([place]) 10/17/2020   Atrial fibrillation, rapid ([place])    Hypokalemia    Syncope     Past Surgical History:  Procedure Laterality Date   CARDIOVERSION N/A 12/05/2020   Procedure: CARDIOVERSION;  Surgeon: Sanda Klein, MD;  Location: [facility] ENDOSCOPY;  Service: Cardiovascular;  Laterality: N/A;   CHOLECYSTECTOMY     COLOSTOMY      RIGHT/LEFT HEART CATH AND CORONARY ANGIOGRAPHY N/A 12/04/2020   Procedure: RIGHT/LEFT HEART CATH AND CORONARY ANGIOGRAPHY;  Surgeon: Belva Crome, MD;  Location: [facility];  Service: Cardiovascular;  Laterality: N/A;   TEE WITHOUT CARDIOVERSION N/A 12/05/2020   Procedure: TRANSESOPHAGEAL ECHOCARDIOGRAM (TEE);  Surgeon: Sanda Klein, MD;  Location: [facility] ENDOSCOPY;  Service: Cardiovascular;  Laterality: N/A;     OB History   No obstetric history on file.     No family history on file.  Social History   Tobacco Use   Smoking status: Never   Smokeless tobacco: Never  Vaping Use   Vaping Use: Never used  Substance Use Topics   Alcohol use: Not Currently    Comment: weekends   Drug use: No    Home Medications Prior to Admission medications   Medication Sig Start Date End Date Taking? Authorizing Provider  allopurinol (ZYLOPRIM) 100 MG tablet Take 100 mg by mouth daily.    [provider]  amiodarone (PACERONE) 200 MG tablet Take 1 tablet (200 mg total) by mouth daily. 12/08/20   Lacinda Axon, MD  apixaban (ELIQUIS) 2.5 MG TABS tablet Take 2.5 mg by mouth 2 (two) times daily.    [provider]  atorvastatin (LIPITOR) 20 MG tablet Take 20 mg by mouth every evening.    [provider]  colchicine 0.6 MG tablet Take 0.6 mg by mouth daily.    [provider]  folic acid (  FOLVITE) 1 MG tablet Take 1 mg by mouth in the morning.    [provider]  furosemide (LASIX) 40 MG tablet Take 1 tablet (40 mg total) by mouth daily. 07/01/21   Donato Heinz, MD  magnesium oxide (MAG-OX) 400 MG tablet Take 1 tablet (400 mg total) by mouth daily. 03/31/21   Donato Heinz, MD  metoprolol succinate (TOPROL-XL) 100 MG 24 hr tablet Take 0.5 tablets (50 mg total) by mouth daily. Take with or immediately following a meal. 01/09/21   Cleaver, Jossie Ng, NP  pantoprazole (PROTONIX) 40 MG tablet Take 1 tablet (40 mg total) by mouth 2 (two)  times daily. 05/21/21   Regalado, Belkys A, MD  Potassium Chloride ER 20 MEQ TBCR Take 20 mEq by mouth daily. 07/01/21   Donato Heinz, MD  thiamine (VITAMIN B-1) 100 MG tablet Take 100 mg by mouth daily.    [provider]  vitamin B-12 1000 MCG tablet Take 1 tablet (1,000 mcg total) by mouth daily. 05/22/21   Regalado, Belkys A, MD  spironolactone (ALDACTONE) 25 MG tablet Take 0.5 tablets (12.5 mg total) by mouth daily. ON HOLD AS OF 3/11 per Dr. Gardiner Rhyme, see phone note Patient not taking: No sig reported 01/02/21 05/19/21  Donato Heinz, MD    Allergies    Patient has no known allergies.  Review of Systems   Review of Systems  Constitutional:  Negative for chills and fever.  HENT:  Negative for ear pain and sore throat.   Eyes:  Negative for pain and visual disturbance.  Respiratory:  Positive for shortness of breath. Negative for cough.   Cardiovascular:  Positive for leg swelling. Negative for chest pain and palpitations.  Gastrointestinal:  Positive for abdominal distention. Negative for abdominal pain and vomiting.  Genitourinary:  Negative for dysuria and hematuria.  Musculoskeletal:  Negative for arthralgias and back pain.  Skin:  Negative for color change and rash.  Neurological:  Positive for weakness. Negative for seizures and syncope.  All other systems reviewed and are negative.  Physical Exam Updated Vital Signs BP (!) 152/88   Pulse 76   Temp 98.2 F (36.8 C) (Oral)   Resp 19   Ht 5\' 4"  (1.626 m)   Wt 54 kg   SpO2 99%   BMI 20.43 kg/m   Physical Exam Vitals and nursing note reviewed.  Constitutional:      General: She is not in acute distress.    Appearance: She is well-developed.  HENT:     Head: Normocephalic and atraumatic.  Eyes:     Conjunctiva/sclera: Conjunctivae normal.  Cardiovascular:     Rate and Rhythm: Normal rate and regular rhythm.     Heart sounds: No murmur heard. Pulmonary:     Effort: Pulmonary effort is  normal. No respiratory distress.     Breath sounds: Normal breath sounds.  Abdominal:     General: Abdomen is protuberant.     Palpations: Abdomen is soft.     Tenderness: There is no abdominal tenderness.     Comments: Abdominal distension and generalized tenderness in all quadrants   Musculoskeletal:     Cervical back: Neck supple.     Right lower leg: 4+ Pitting Edema present.     Left lower leg: 4+ Pitting Edema present.  Skin:    General: Skin is warm and dry.  Neurological:     Mental Status: She is alert.    ED Results / Procedures / Treatments  Labs (all labs ordered are listed, but only abnormal results are displayed) Labs Reviewed  RESP PANEL BY RT-PCR (FLU A&B, COVID) ARPGX2  CBC WITH DIFFERENTIAL/PLATELET  BRAIN NATRIURETIC PEPTIDE  COMPREHENSIVE METABOLIC PANEL  LIPASE, BLOOD  PROTIME-INR  URINALYSIS, ROUTINE W REFLEX MICROSCOPIC  TROPONIN I (HIGH SENSITIVITY)    EKG None  Radiology No results found.  Procedures Procedures   Medications Ordered in ED Medications - No data to display  ED Course  I have reviewed the triage vital signs and the nursing notes.  Pertinent labs & imaging results that were available during my care of the patient were reviewed by me and considered in my medical decision making (see chart for details).    MDM Rules/Calculators/A&P                          2:45 PM  80 yo female presenting for generalized weakness and sob. Patient is Aox3 with conversational dyspnea, hypoxia-no hx of home oxygen, placed on 2 L , grossly distended abdomen concerning for ascites, with bilateral lower extremity edema +3. Concerns for fluid overloaded state.   EKG stable with no ST segment elevation or depression. Lab studies pending. CT abdomen pending. Pt signed out to oncoming physician.    Final Clinical Impression(s) / ED Diagnoses Final diagnoses:  Weakness  Abdominal distension  SOB (shortness of breath)  Hypervolemia,  unspecified hypervolemia type  Hypoxia    Rx / DC Orders ED Discharge Orders     None        Lianne Cure, DO 44/31/54 1148

## 2021-07-17 NOTE — Telephone Encounter (Signed)
Pt c/o swelling: STAT is pt has developed SOB within 24 hours  If swelling, where is the swelling located? Feet ankles and legs  How much weight have you gained and in what time span? States it's been up and down a pound or 2   Have you gained 3 pounds in a day or 5 pounds in a week? no  Do you have a log of your daily weights (if so, list)? This morning 116.4, yesterday 120.2, day prior 118. 2, 20th 119.7  Are you currently taking a fluid pill? yes  Are you currently SOB? no  Have you traveled recently? No   [name] states the patient has swelling in her feet, ankles, and legs. She states it feels like pins and needles in her toes. She states the patient's weight is up and down and her BP has also been elevated. Her BP from a few minutes ago was 153/86 HR 92, yesterday 145/90 HR 78. 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

## 2021-07-17 NOTE — ED Notes (Signed)
Patient transported to X-ray 

## 2021-07-17 NOTE — Telephone Encounter (Signed)
Returned call to daughter-daughter reports increased swelling in patients feet, ankles, and calves.   Also reports abdominal swelling and painful to touch.  Reports abdomen is normally larger than normal due to colostomy but states it is "huge" today.  Reports this happening last time she was hospitalized with CHF.   Daughter reports patient is not complaint with compressions stockings, elevating her feet or eating low sodium.  Reports she drinks a lot of soups.  She got there this morning and patient had not taken her AM medications yet, just took them a few minutes ago.   She does have patient weigh daily and her weights have been fluctuating (see weights in previous message).  BP this AM 153/86 HR 92, was before medications taken.  Patient reports some SOB with exertion, not significantly increased from usual.   Patient is lying in bed and not wanting to do much today, daughter reports this is normal when she doesn't feel well.     Advised would discuss with Dr. Gardiner Rhyme and call back

## 2021-07-17 NOTE — H&P (Signed)
History and Physical    Maria Travis FFM:[id number] DOB: [birth year] DOA: 07/17/2021  PCP: Seward Carol, MD (Confirm with patient/family/NH records and if not entered, this has to be entered at [facility] point of entry) Patient coming from: [place]  I have personally briefly reviewed patient's old medical records in [place]  Chief Complaint: increased swelling, abdominal pain, increased SOB  HPI: Maria Travis is a 80 y.o. 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  She was admitted to [facility] on 12/02/2020 with atrial fibrillation with RVR and new onset systolic heart failure, EF 25 to 30%.  LHC showed normal coronary arteries.  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  She underwent TEE/cardioversion on 12/05/2020.  She was discharged on amiodarone.  Repeat echo 02/09/2021 showed EF improved to 55%, also with grade 2 diastolic dysfunction, normal RV function, RVSP 56 mmHg, mild MR, moderate to severe TR, mild left atrial dilatation, moderate right atrial dilatation.   She was admitted to [facility] from 7/25 through 05/21/2021 with acute on chronic diastolic heart failure.  She was diuresed with IV Lasix and discharged on p.o. Lasix 40 mg daily.  Her [name] was discontinued.  Per patient and her daughter she has had increasing SOB and abdominal pain for several days. She has had distention of her abdomen and her colostomy bag. Increased swelling of the LE is also reported. Dr. Nechama Guard, cardiology, who saw the patient 07/01/21 was called and referred the patient to [facility].    ED Course: T98.2  166/90  HR 78 rr 24, Cmet reveals Cr 1.3 (baseline <1, recent values as high as 1.75) Troponin #1 44, #2 58, INR1.5  T. Bili 1.3  Hgb 10.9 (11.4 12/06/20). CT abdomen/pelvis reveals moderate ascite, moderate to severe  anasarca. CXR w/ atelectasis. Patient was given 40 mg IV lasix in ED with good UOP. Dr. Harrington Challenger for cardiology saw the patient in the ED. TRH is called to admit for continued management.   Review of Systems: As per HPI otherwise 10 point review of systems negative except for a complaint of left chest wall pain at the axillary line.  Past Medical History:  Diagnosis Date   Atrial fibrillation (HCC)    Chronic diastolic (congestive) heart failure (HCC)    Diverticulitis    High cholesterol    Hypertension     Past Surgical History:  Procedure Laterality Date   CARDIOVERSION N/A 12/05/2020   Procedure: CARDIOVERSION;  Surgeon: Sanda Klein, MD;  Location: [facility] ENDOSCOPY;  Service: Cardiovascular;  Laterality: N/A;   CHOLECYSTECTOMY     COLOSTOMY     RIGHT/LEFT HEART CATH AND CORONARY ANGIOGRAPHY N/A 12/04/2020   Procedure: RIGHT/LEFT HEART CATH AND CORONARY ANGIOGRAPHY;  Surgeon: Belva Crome, MD;  Location: [place] CV LAB;  Service: Cardiovascular;  Laterality: N/A;   TEE WITHOUT CARDIOVERSION N/A 12/05/2020   Procedure: TRANSESOPHAGEAL ECHOCARDIOGRAM (TEE);  Surgeon: Sanda Klein, MD;  Location: [facility] ENDOSCOPY;  Service: Cardiovascular;  Laterality: N/A;   Soc Hx - married 31 years then widowed. She has one daughter and several grandchildren. She lived in [place]. 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 She recently moved to [facility] and is at assisted living. She has a long history of heavy alcohol abuse but currently does not drink.    reports that she has never smoked. She has never used smokeless  tobacco. She reports that she does not currently use alcohol. She reports that she does not use drugs.  No Known Allergies  No family history on file.   Prior to Admission medications   Medication Sig Start Date End Date Taking? Authorizing Provider  acetaminophen (TYLENOL) 325 MG tablet Take 650 mg by mouth every 6 (six) hours as needed for mild pain or  headache.   Yes [provider]  allopurinol (ZYLOPRIM) 100 MG tablet Take 100 mg by mouth daily.   Yes [provider]  amiodarone (PACERONE) 200 MG tablet Take 1 tablet (200 mg total) by mouth daily. 12/08/20  Yes Amponsah, Charisse March, MD  apixaban (ELIQUIS) 2.5 MG TABS tablet Take 2.5 mg by mouth 2 (two) times daily.   Yes [provider]  atorvastatin (LIPITOR) 20 MG tablet Take 20 mg by mouth every evening.   Yes [provider]  colchicine 0.6 MG tablet Take 0.6 mg by mouth daily.   Yes [provider]  folic acid (FOLVITE) 1 MG tablet Take 1 mg by mouth in the morning.   Yes [provider]  furosemide (LASIX) 40 MG tablet Take 1 tablet (40 mg total) by mouth daily. Patient taking differently: Take 40 mg by mouth every other day. 07/01/21  Yes Donato Heinz, MD  Loratadine 10 MG CAPS Take 10 mg by mouth daily.   Yes [provider]  magnesium oxide (MAG-OX) 400 MG tablet Take 1 tablet (400 mg total) by mouth daily. 03/31/21  Yes Donato Heinz, MD  metoprolol succinate (TOPROL-XL) 100 MG 24 hr tablet Take 0.5 tablets (50 mg total) by mouth daily. Take with or immediately following a meal. Patient taking differently: Take 100 mg by mouth daily. 01/09/21  Yes Cleaver, Jossie Ng, NP  pantoprazole (PROTONIX) 40 MG tablet Take 1 tablet (40 mg total) by mouth 2 (two) times daily. 05/21/21  Yes Regalado, Belkys A, MD  Potassium Chloride ER 20 MEQ TBCR Take 20 mEq by mouth daily. Patient taking differently: Take 20 mEq by mouth every other day. Take with furosemide 07/01/21  Yes Donato Heinz, MD  thiamine (VITAMIN B-1) 100 MG tablet Take 100 mg by mouth daily.   Yes [provider]  vitamin B-12 1000 MCG tablet Take 1 tablet (1,000 mcg total) by mouth daily. 05/22/21  Yes Regalado, Belkys A, MD  spironolactone (ALDACTONE) 25 MG tablet Take 0.5 tablets (12.5 mg total) by mouth daily. ON HOLD AS OF 3/11 per Dr.  Gardiner Rhyme, see phone note Patient not taking: No sig reported 01/02/21 05/19/21  Donato Heinz, MD    Physical Exam: Vitals:   07/17/21 1400 07/17/21 1415 07/17/21 1543 07/17/21 1930  BP: 140/87 (!) 152/88  (!) 166/90  Pulse: 78 76 77 78  Resp: (!) 22 19 20  (!) 24  Temp:      TempSrc:      SpO2: 98% 99% 95% 96%  Weight:      Height:         Vitals:   07/17/21 1400 07/17/21 1415 07/17/21 1543 07/17/21 1930  BP: 140/87 (!) 152/88  (!) 166/90  Pulse: 78 76 77 78  Resp: (!) 22 19 20  (!) 24  Temp:      TempSrc:      SpO2: 98% 99% 95% 96%  Weight:      Height:       General: elderly woman who is uncomfortable and c/o chest wall pain.Eyes: PERRL, lids and conjunctivae normal  ENMT: Mucous membranes are moist. Posterior pharynx clear of any exudate or lesions.dentition fair with missing teeth.  Neck: normal, supple, no masses, no thyromegaly Respiratory: clear to auscultation bilaterally, no wheezing, no crackles. Normal respiratory effort. No accessory muscle use.  Cardiovascular: Regular rate and rhythm. Heart sounds distant: no murmurs / rubs / gallops. 1+ LE edema to knees.  1+ pedal pulses. No carotid bruits.  Abdomen: BS + x 4, colostomy left side of abdomen. Moderate abdominal wall distention. Ventral scar. Liver edge 3 cm below costal margin-exam limited by tenderness to palpation and percussion.  Musculoskeletal: no clubbing / cyanosis. No joint deformity upper and lower extremities. Good ROM, no contractures. Normal muscle tone.  Skin: no rashes, lesions, ulcers. No induration Neurologic: CN 2-12 grossly intact. Sensation intact. Psychiatric: Normal judgment and insight. Alert and oriented x 3. Normal mood.     Labs on Admission: I have personally reviewed following labs and imaging studies  CBC: Recent Labs  Lab 07/17/21 1454  WBC 10.9*  NEUTROABS 9.1*  HGB 10.2*  HCT 32.1*  MCV 94.4  PLT 97*   Basic Metabolic Panel: Recent Labs  Lab 07/17/21 1454   NA 142  K 4.3  CL 112*  CO2 21*  GLUCOSE 86  BUN 21  CREATININE 1.30*  CALCIUM 9.0  MG 1.5*   GFR: Estimated Creatinine Clearance: 28.9 mL/min (A) (by C-G formula based on SCr of 1.3 mg/dL (H)). Liver Function Tests: Recent Labs  Lab 07/17/21 1454  AST 34  ALT 16  ALKPHOS 86  BILITOT 1.3*  PROT 6.0*  ALBUMIN 3.3*   Recent Labs  Lab 07/17/21 1454  LIPASE 39   No results for input(s): AMMONIA in the last 168 hours. Coagulation Profile: Recent Labs  Lab 07/17/21 1454  INR 1.5*   Cardiac Enzymes: No results for input(s): CKTOTAL, CKMB, CKMBINDEX, TROPONINI in the last 168 hours. BNP (last 3 results) No results for input(s): PROBNP in the last 8760 hours. HbA1C: No results for input(s): HGBA1C in the last 72 hours. CBG: No results for input(s): GLUCAP in the last 168 hours. Lipid Profile: No results for input(s): CHOL, HDL, LDLCALC, TRIG, CHOLHDL, LDLDIRECT in the last 72 hours. Thyroid Function Tests: No results for input(s): TSH, T4TOTAL, FREET4, T3FREE, THYROIDAB in the last 72 hours. Anemia Panel: No results for input(s): VITAMINB12, FOLATE, FERRITIN, TIBC, IRON, RETICCTPCT in the last 72 hours. Urine analysis:    Component Value Date/Time   COLORURINE YELLOW 05/19/2021 1319   APPEARANCEUR HAZY (A) 05/19/2021 1319   LABSPEC 1.020 05/19/2021 1319   PHURINE 5.0 05/19/2021 1319   GLUCOSEU NEGATIVE 05/19/2021 1319   HGBUR SMALL (A) 05/19/2021 1319   BILIRUBINUR NEGATIVE 05/19/2021 1319   KETONESUR NEGATIVE 05/19/2021 1319   PROTEINUR 100 (A) 05/19/2021 1319   NITRITE NEGATIVE 05/19/2021 1319   LEUKOCYTESUR NEGATIVE 05/19/2021 1319    Radiological Exams on Admission: CT ABDOMEN PELVIS W CONTRAST  Result Date: 07/17/2021 CLINICAL DATA:  Abdominal distension. EXAM: CT ABDOMEN AND PELVIS WITH CONTRAST TECHNIQUE: Multidetector CT imaging of the abdomen and pelvis was performed using the standard protocol following bolus administration of intravenous  contrast. CONTRAST:  135mL OMNIPAQUE IOHEXOL 300 MG/ML  SOLN COMPARISON:  May 19, 2021 FINDINGS: Lower chest: Mild atelectasis is seen within the bilateral lung bases. There is a small right pleural effusion. Hepatobiliary: There is diffuse fatty infiltration of the liver parenchyma. No focal liver abnormality is seen. Status post cholecystectomy. No biliary dilatation. Pancreas: Pancreatic duct is visualized and measures  5 mm in diameter. This is stable in size when compared to the prior study. The pancreatic parenchyma is otherwise unremarkable. Spleen: Normal in size without focal abnormality. Adrenals/Urinary Tract: Adrenal glands are unremarkable. Kidneys are normal in size, without renal calculi or hydronephrosis. A 1.5 cm diameter cyst is seen along the anterolateral aspect of the mid to lower right kidney. Bladder is unremarkable. Stomach/Bowel: Stomach is within normal limits. The appendix is not identified. Surgically anastomosed bowel is again seen along the midline of the lower abdomen and pelvis. This bowel loop is stable in caliber and is persistently dilated. Vascular/Lymphatic: Aortic atherosclerosis. No enlarged abdominal or pelvic lymph nodes. Reproductive: Uterus and bilateral adnexa are unremarkable. 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 This hernia contains fat, fluid and multiple loops of nondilated small bowel. A stable 8.4 cm x 4.4 cm ventral hernia is seen along the midline of the anterior pelvic wall, adjacent to the previously noted ostomy site. This also contains fat and nondilated small bowel loops. A moderate amount of abdominopelvic ascites is seen. Musculoskeletal: There is moderate to marked severity anasarca throughout the abdominal and pelvic walls. Multilevel degenerative changes are seen throughout the thoracolumbar spine with approximately 5 mm anterolisthesis of the L4 vertebral body on L5. IMPRESSION: 1.  Small right pleural effusion. 2. Stable postoperative changes with a left lower quadrant ostomy site and large, stable parastomal hernia. 3. Stable ventral hernia along the midline of the anterior pelvic wall, adjacent to the previously noted ostomy site. 4. Evidence of prior cholecystectomy. 5. Moderate severity abdominopelvic ascites. 6. Moderate to marked severity anasarca. 7. Aortic atherosclerosis. Aortic Atherosclerosis (ICD10-I70.0). Electronically Signed   By: Virgina Norfolk M.D.   On: 07/17/2021 17:49   DG Chest Portable 1 View  Result Date: 07/17/2021 CLINICAL DATA:  Shortness of breath. EXAM: PORTABLE CHEST 1 VIEW COMPARISON:  July 02, 2021. FINDINGS: Stable cardiomegaly. Pneumothorax or pleural effusion is noted. Left lung is clear. Stable right basilar subsegmental atelectasis or scarring is noted. Bony thorax is unremarkable. IMPRESSION: Stable right basilar subsegmental atelectasis or scarring. Electronically Signed   By: Marijo Conception M.D.   On: 07/17/2021 15:23    EKG: Independently reviewed. No EKG on chart  Assessment/Plan Active Problems:   Acute on chronic combined systolic and diastolic CHF (congestive heart failure) (HCC)   Anasarca   Hepatic steatosis   Abdominal ascites   Left-sided chest wall pain   Paroxysmal atrial fibrillation (HCC)   Hypertension   Acute on chronic combined systolic (congestive) and diastolic (congestive) heart failure (HCC)  Acute on chronic combined heart failure - CXR w/o pulmonary edema. Troponin mildly elevated x 2. No EKG. Cardiology has seen patient Plan BNP ordered and pending  EKG ordered  Continue home cardiac meds - not on ACE/ARB 2/2 renal insufficiency.  Lasix 40 mg IV q 12 x 3 doses  Cardiac tele admit  2D echo - last done in April '22.  2. GI - patient with hepatic steatosis and ascites. She has a h/o heavy alcohol use. Suspect Cirrhosis as cause of ascites. Plan Lab: hepatitis panel, serum ferritin  IR for  paracentesis for analysis, r/o ABP  GI consult-Secure chat sent to Dr. Silverio Decamp  3. Left-sided chest wall pain. Patient has had several falls. Plan Left chest rib details  Ketorolac 15 mg q 6 prn pain  4. PAF - in stable rhythm on exam Plan EKG  5.  HTN- stable. Will continue home meds  6. Code status - asked patient: she would not want attempted resuscitation. Confirmed with daughter who agrees stating her mother has said that this was her wish on multiple occasions.       DVT prophylaxis: eliquis  Code Status: DNR  Disposition Plan: return to [place] when stable  Consults called: Cardiology - Dr. Harrington Challenger; GI - secure chat to Dr. Silverio Decamp  Admission status: inpatient-tele    Adella Hare MD [facility] Pager [phone]  If 7PM-7AM, please contact night-coverage [email or web address] Password [facility]  07/17/2021, 9:57 PM

## 2021-07-17 NOTE — ED Triage Notes (Signed)
Pt BIB [facility] for increased weakness and shortness of breath this AM, pt has abdominal distension, and colostomy bag that had "exploded" upon EMS arrival, pt has hx of CHF and has bilateral pitting lower leg edema

## 2021-07-18 ENCOUNTER — Inpatient Hospital Stay (HOSPITAL_COMMUNITY): Payer: Medicare Other

## 2021-07-18 ENCOUNTER — Encounter (HOSPITAL_COMMUNITY): Payer: Self-pay | Admitting: Internal Medicine

## 2021-07-18 DIAGNOSIS — E8770 Fluid overload, unspecified: Secondary | ICD-10-CM | POA: Diagnosis not present

## 2021-07-18 DIAGNOSIS — R188 Other ascites: Secondary | ICD-10-CM

## 2021-07-18 DIAGNOSIS — R0602 Shortness of breath: Secondary | ICD-10-CM

## 2021-07-18 DIAGNOSIS — R140 Abdominal distension (gaseous): Secondary | ICD-10-CM | POA: Diagnosis not present

## 2021-07-18 DIAGNOSIS — I5031 Acute diastolic (congestive) heart failure: Secondary | ICD-10-CM | POA: Diagnosis not present

## 2021-07-18 DIAGNOSIS — R768 Other specified abnormal immunological findings in serum: Secondary | ICD-10-CM | POA: Diagnosis not present

## 2021-07-18 DIAGNOSIS — I5043 Acute on chronic combined systolic (congestive) and diastolic (congestive) heart failure: Secondary | ICD-10-CM | POA: Diagnosis not present

## 2021-07-18 LAB — URINALYSIS, ROUTINE W REFLEX MICROSCOPIC
Bacteria, UA: NONE SEEN
Bilirubin Urine: NEGATIVE
Glucose, UA: NEGATIVE mg/dL
Ketones, ur: NEGATIVE mg/dL
Leukocytes,Ua: NEGATIVE
Nitrite: NEGATIVE
Protein, ur: NEGATIVE mg/dL
Specific Gravity, Urine: 1.015 (ref 1.005–1.030)
pH: 5 (ref 5.0–8.0)

## 2021-07-18 LAB — BASIC METABOLIC PANEL
Anion gap: 10 (ref 5–15)
BUN: 19 mg/dL (ref 8–23)
CO2: 20 mmol/L — ABNORMAL LOW (ref 22–32)
Calcium: 9.3 mg/dL (ref 8.9–10.3)
Chloride: 110 mmol/L (ref 98–111)
Creatinine, Ser: 1.25 mg/dL — ABNORMAL HIGH (ref 0.44–1.00)
GFR, Estimated: 43 mL/min — ABNORMAL LOW (ref >60)
Glucose, Bld: 76 mg/dL (ref 70–99)
Potassium: 3.6 mmol/L (ref 3.5–5.1)
Sodium: 140 mmol/L (ref 135–145)

## 2021-07-18 LAB — HEPATITIS PANEL, ACUTE
HCV Ab: REACTIVE — AB
Hep A IgM: NONREACTIVE
Hep B C IgM: NONREACTIVE
Hepatitis B Surface Ag: NONREACTIVE

## 2021-07-18 LAB — ECHOCARDIOGRAM COMPLETE
Area-P 1/2: 5.02 cm2
Height: 63 in
MV M vel: 5.45 m/s
MV Peak grad: 118.8 mmHg
P 1/2 time: 340 msec
Radius: 0.4 cm
S' Lateral: 3.8 cm
Single Plane A2C EF: 23.7 %
Weight: 1869.5 oz

## 2021-07-18 MED ORDER — LIDOCAINE 5 % EX PTCH
1.0000 | MEDICATED_PATCH | CUTANEOUS | Status: DC
  Administered 2021-07-18 – 2021-07-19 (×2): 1 via TRANSDERMAL
  Filled 2021-07-18 (×3): qty 1

## 2021-07-18 NOTE — Progress Notes (Signed)
  Echocardiogram 2D Echocardiogram has been performed.  Maria Travis 07/18/2021, 4:43 PM

## 2021-07-18 NOTE — Progress Notes (Signed)
Progress Note  Patient Name: Maria Travis Date of Encounter: 07/18/2021  [facility] Cardiologist: Donato Heinz, MD   Subjective   Ongoing SOB  Inpatient Medications    Scheduled Meds:  allopurinol  100 mg Oral Daily   amiodarone  200 mg Oral Daily   apixaban  2.5 mg Oral BID   atorvastatin  20 mg Oral QPM   colchicine  0.6 mg Oral Daily   folic acid  1 mg Oral q AM   furosemide  40 mg Intravenous Q12H   magnesium oxide  400 mg Oral Daily   metoprolol succinate  100 mg Oral Daily   pantoprazole  40 mg Oral BID   potassium chloride  20 mEq Oral QODAY   sodium chloride flush  3 mL Intravenous Q12H   spironolactone  25 mg Oral Daily   thiamine  100 mg Oral Daily   cyanocobalamin  1,000 mcg Oral Daily   Continuous Infusions:  sodium chloride     PRN Meds: sodium chloride, acetaminophen, ketorolac, ondansetron (ZOFRAN) IV, sodium chloride flush   Vital Signs    Vitals:   07/18/21 0630 07/18/21 0700 07/18/21 0900 07/18/21 1030  BP: (!) 143/83 (!) 164/88 (!) 175/92 (!) 159/92  Pulse: 71 67 83 84  Resp: (!) 24 16 (!) 21 (!) 26  Temp:  98.5 F (36.9 C)    TempSrc:  Oral    SpO2: 97% 97% 98% 99%  Weight:      Height:        Intake/Output Summary (Last 24 hours) at 07/18/2021 1125 Last data filed at 07/18/2021 0849 Gross per 24 hour  Intake --  Output 1300 ml  Net -1300 ml   Last 3 Weights 07/17/2021 07/01/2021 06/03/2021  Weight (lbs) 119 lb 0.8 oz 119 lb 117 lb 6.4 oz  Weight (kg) 54 kg 53.978 kg 53.252 kg      Telemetry    NSR - Personally Reviewed  ECG    N/a - Personally Reviewed  Physical Exam   GEN: No acute distress.   Neck: No JVD Cardiac: RRR, no murmurs, rubs, or gallops.  Respiratory: Crackles bilateral bases GI: Soft, nontender, non-distended  MS: 1+ bilateral LE edema Neuro:  Nonfocal  Psych: Normal affect   Labs    High Sensitivity Troponin:   Recent Labs  Lab 07/17/21 1454 07/17/21 1644  TROPONINIHS 44* 58*      Chemistry Recent Labs  Lab 07/17/21 1454 07/18/21 0341  NA 142 140  K 4.3 3.6  CL 112* 110  CO2 21* 20*  GLUCOSE 86 76  BUN 21 19  CREATININE 1.30* 1.25*  CALCIUM 9.0 9.3  MG 1.5*  --   PROT 6.0*  --   ALBUMIN 3.3*  --   AST 34  --   ALT 16  --   ALKPHOS 86  --   BILITOT 1.3*  --   GFRNONAA 41* 43*  ANIONGAP 9 10    Lipids No results for input(s): CHOL, TRIG, HDL, LABVLDL, LDLCALC, CHOLHDL in the last 168 hours.  Hematology Recent Labs  Lab 07/17/21 1454  WBC 10.9*  RBC 3.40*  HGB 10.2*  HCT 32.1*  MCV 94.4  MCH 30.0  MCHC 31.8  RDW 16.7*  PLT 97*   Thyroid  Recent Labs  Lab 07/17/21 2052  TSH 2.581    BNP Recent Labs  Lab 07/17/21 1444 07/17/21 2052  BNP 2,777.9* 4,004.4*    DDimer No results for input(s): DDIMER in the  last 168 hours.   Radiology    DG Ribs Unilateral Left  Result Date: 07/17/2021 CLINICAL DATA:  Left-sided chest wall pain. Pt mentioned a h/o falls. Pt stated that her upper left side chest is painful when breathing or moving. Pt also stated that it is sore to the touch. Hx; A-fib, chronic diastolic heart failure, high cholesterol, HTN EXAM: LEFT RIBS - 2 VIEW COMPARISON:  None. FINDINGS: No acute displaced fracture or other bone lesions are seen involving the ribs. Persistent cardiomegaly. IMPRESSION: No acute displaced left rib fracture. Please note, nondisplaced rib fractures may be occult on radiograph. Electronically Signed   By: Iven Finn M.D.   On: 07/17/2021 22:55   CT ABDOMEN PELVIS W CONTRAST  Result Date: 07/17/2021 CLINICAL DATA:  Abdominal distension. EXAM: CT ABDOMEN AND PELVIS WITH CONTRAST TECHNIQUE: Multidetector CT imaging of the abdomen and pelvis was performed using the standard protocol following bolus administration of intravenous contrast. CONTRAST:  175mL OMNIPAQUE IOHEXOL 300 MG/ML  SOLN COMPARISON:  May 19, 2021 FINDINGS: Lower chest: Mild atelectasis is seen within the bilateral lung bases. There is a  small right pleural effusion. Hepatobiliary: There is diffuse fatty infiltration of the liver parenchyma. No focal liver abnormality is seen. Status post cholecystectomy. No biliary dilatation. Pancreas: Pancreatic duct is visualized and measures 5 mm in diameter. This is stable in size when compared to the prior study. The pancreatic parenchyma is otherwise unremarkable. Spleen: Normal in size without focal abnormality. Adrenals/Urinary Tract: Adrenal glands are unremarkable. Kidneys are normal in size, without renal calculi or hydronephrosis. A 1.5 cm diameter cyst is seen along the anterolateral aspect of the mid to lower right kidney. Bladder is unremarkable. Stomach/Bowel: Stomach is within normal limits. The appendix is not identified. Surgically anastomosed bowel is again seen along the midline of the lower abdomen and pelvis. This bowel loop is stable in caliber and is persistently dilated. Vascular/Lymphatic: Aortic atherosclerosis. No enlarged abdominal or pelvic lymph nodes. Reproductive: Uterus and bilateral adnexa are unremarkable. 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 This hernia contains fat, fluid and multiple loops of nondilated small bowel. A stable 8.4 cm x 4.4 cm ventral hernia is seen along the midline of the anterior pelvic wall, adjacent to the previously noted ostomy site. This also contains fat and nondilated small bowel loops. A moderate amount of abdominopelvic ascites is seen. Musculoskeletal: There is moderate to marked severity anasarca throughout the abdominal and pelvic walls. Multilevel degenerative changes are seen throughout the thoracolumbar spine with approximately 5 mm anterolisthesis of the L4 vertebral body on L5. IMPRESSION: 1. Small right pleural effusion. 2. Stable postoperative changes with a left lower quadrant ostomy site and large, stable parastomal hernia. 3. Stable ventral hernia along the midline  of the anterior pelvic wall, adjacent to the previously noted ostomy site. 4. Evidence of prior cholecystectomy. 5. Moderate severity abdominopelvic ascites. 6. Moderate to marked severity anasarca. 7. Aortic atherosclerosis. Aortic Atherosclerosis (ICD10-I70.0). Electronically Signed   By: Virgina Norfolk M.D.   On: 07/17/2021 17:49   US Abdomen Limited  Result Date: 07/18/2021 CLINICAL DATA:  Abdominal distension, concern for ascites. Please perform ascites search ultrasound and ultrasound-guided paracentesis as indicated. EXAM: LIMITED ABDOMEN ULTRASOUND FOR ASCITES TECHNIQUE: Limited ultrasound survey for ascites was performed in all four abdominal quadrants. COMPARISON:  CT abdomen pelvis-07/17/2021 FINDINGS: Sonographic evaluation of the abdomen demonstrates a trace amount of perihepatic ascites (image 4), too small  to allow for safe therapeutic or diagnostic paracentesis. No paracentesis attempted. IMPRESSION: Trace amount of perihepatic ascites, too small to allow for safe ultrasound-guided therapeutic or diagnostic paracentesis. No paracentesis attempted. Electronically Signed   By: Sandi Mariscal M.D.   On: 07/18/2021 10:45   DG Chest Portable 1 View  Result Date: 07/17/2021 CLINICAL DATA:  Shortness of breath. EXAM: PORTABLE CHEST 1 VIEW COMPARISON:  July 02, 2021. FINDINGS: Stable cardiomegaly. Pneumothorax or pleural effusion is noted. Left lung is clear. Stable right basilar subsegmental atelectasis or scarring is noted. Bony thorax is unremarkable. IMPRESSION: Stable right basilar subsegmental atelectasis or scarring. Electronically Signed   By: Marijo Conception M.D.   On: 07/17/2021 15:23    Cardiac Studies     Patient Profile     Maria Travis is a 80 y.o. 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 Vanita Panda.  Assessment & Plan    1.Acute on chronic combined systolic/diastolic HF - 10/1550 echo LVEF 25-30% - 01/2021 echo LVEF 55%, no WMAs, grade II dd.  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 Outpatient diuretics have been complicated by decrease in renal function - odd BNP pattern, elevated at 2700 on admit later same day reported at 4000  - very I/Os data thus far, she received IV lasix 40mg  x 1 yesterday, due for bid dosing today. Cr is trending down consistent with venous congestion and CHF.  - continue IV lasix - f/u repeat echo  2. Elevated troponin - mild with mild delta in setting of HF - at this time do not suspect ACS   3. Afib - she is on amio, coumadin - EKG on admit showed SR  4. Ascites - to be evaluated by GI - US showed only trace fluid   For questions or updates, please contact [place] Please consult [email or web address] for contact info under        Signed, Carlyle Dolly, MD  07/18/2021, 11:25 AM

## 2021-07-18 NOTE — Plan of Care (Signed)

## 2021-07-18 NOTE — TOC Initial Note (Signed)
Transition of Care [facility]) - Initial/Assessment Note    Patient Details  Name: Maria Travis MRN: [id number] Date of Birth: 15-Nov-1940  Transition of Care [facility]) CM/SW Contact:    Verdell Carmine, RN Phone Number: 07/18/2021, 12:26 PM  Clinical Narrative:                  [name] is a 80 year old who came within the last year to live with her family. She has a history of CHF EF 25-30 as well as remote ETOH history, stopped one year ago. Presents today with increased swelling of , abdomen, legs. Abd films show ascites,  fatty liver, She has hernias exhibited near ostomy site.   PT consult placed for tomorrow for recommendations on rehab vs [facility] CM will follow for needs, recommendations, and transitions.   Expected Discharge Plan: [place] (SNF Vs [facility]) Barriers to Discharge: Continued Medical Work up   Patient Goals and CMS Choice        Expected Discharge Plan and Services Expected Discharge Plan: [place] (SNF Vs [facility])       Living arrangements for the past 2 months: Single Family Home                                      Prior Living Arrangements/Services Living arrangements for the past 2 months: Single Family Home Lives with:: Adult Children Patient language and need for interpreter reviewed:: Yes        Need for Family Participation in Patient Care: Yes (Comment) Care giver support system in place?: Yes (comment)   Criminal Activity/Legal Involvement Pertinent to Current Situation/Hospitalization: No - Comment as needed  Activities of Daily Living      Permission Sought/Granted                  Emotional Assessment       Orientation: : Oriented to Self, Oriented to  Time, Oriented to Situation, Oriented to Place Alcohol / Substance Use: Not Applicable Psych Involvement: No (comment)  Admission diagnosis:  Acute on chronic combined systolic (congestive) and diastolic (congestive) heart failure (HCC)  [I50.43] Patient Active Problem List   Diagnosis Date Noted   Acute on chronic combined systolic and diastolic CHF (congestive heart failure) ([place]) 07/17/2021   Anasarca 07/17/2021   Hepatic steatosis 07/17/2021   Abdominal ascites 07/17/2021   Acute on chronic combined systolic (congestive) and diastolic (congestive) heart failure ([place]) 07/17/2021   Left-sided chest wall pain 07/17/2021   Acute on chronic diastolic CHF (congestive heart failure) ([place]) 05/19/2021   Gastritis 05/19/2021   Acute HFrEF (heart failure with reduced ejection fraction) ([place])    Enterocolitis    Atrial fibrillation with RVR ([place]) 12/02/2020   Elevated troponin 10/18/2020   Hypertension 10/18/2020   Hyperlipidemia 10/18/2020   AKI (acute kidney injury) ([place]) 10/18/2020   Paroxysmal atrial fibrillation ([place]) 10/17/2020   Atrial fibrillation, rapid ([place])    Hypokalemia    Syncope    PCP:  Seward Carol, MD Pharmacy:   [facility] [place], [place] [address] Phone: [phone] Fax: [place], [address] AT [address] [place] [address] Phone: [phone] Fax: [phone]  [id number] - [place], [place] -  [address] AT [address] [place] [address] Phone: [phone] Fax: [phone]  [facility] [place], [place] [address] [place] [address] Phone: [phone] Fax: [place], [place] AT [facility] [address] [place] [address] Phone: [phone] Fax: [place] [id number] [place], [place] AT [facility] [address] [place] [address] Phone: [phone] Fax: [phone]     Social Determinants of Health ([place]) Interventions    Readmission Risk Interventions No flowsheet data found.

## 2021-07-18 NOTE — Progress Notes (Addendum)
Progress Note    Maria Travis  NOI:[id number] DOB: 15-Oct-1941  DOA: 07/17/2021 PCP: Seward Carol, MD    Brief Narrative:    Medical records reviewed and are as summarized below:  Maria Travis is an 80 y.o. female increasing SOB and abdominal pain for several days. She has had distention of her abdomen and her colostomy bag. Increased swelling of the LE is also reported. Dr. Nechama Guard, cardiology, who saw the patient 07/01/21 was called and referred the patient to [facility].  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 Troponin mildly elevated x 2. No EKG. Cardiology has seen patient - Lasix 40 mg IV q 12 -tele admit  -paracentesis-- U/S shows NOT enough ascites to safely do -cards/GI consult appreciated     Left-sided chest wall pain.  -no obvious fx -lidocaine   PAF - in stable rhythm on exam    HTN -monitor while diuresing       Family Communication/Anticipated D/C date and plan/Code Status   DVT prophylaxis: eliquis Code Status: Full Code.  Disposition Plan: Status is: Inpatient  Remains inpatient appropriate because:Inpatient level of care appropriate due to severity of illness  Dispo: The patient is from: Home              Anticipated d/c is to: Home              Patient currently is not medically stable to d/c.   Difficult to place patient No         Medical Consultants:   GI cards  Subjective:   In procedure  Objective:    Vitals:   07/18/21 0630 07/18/21 0700 07/18/21 0900 07/18/21 1030  BP: (!) 143/83 (!)  164/88 (!) 175/92 (!) 159/92  Pulse: 71 67 83 84  Resp: (!) 24 16 (!) 21 (!) 26  Temp:  98.5 F (36.9 C)    TempSrc:  Oral    SpO2: 97% 97% 98% 99%  Weight:      Height:        Intake/Output Summary (Last 24 hours) at 07/18/2021 1141 Last data filed at 07/18/2021 0849 Gross per 24 hour  Intake --  Output 1300 ml  Net -1300 ml   Filed Weights   07/17/21 1325  Weight: 54 kg    Exam: Off floor  Data Reviewed:   I have personally reviewed following labs and imaging studies:  Labs: Labs show the following:   Basic Metabolic Panel: Recent Labs  Lab 07/17/21 1454 07/18/21 0341  NA 142 140  K 4.3 3.6  CL 112* 110  CO2 21* 20*  GLUCOSE 86 76  BUN 21 19  CREATININE 1.30* 1.25*  CALCIUM 9.0 9.3  MG 1.5*  --    GFR Estimated Creatinine Clearance: 30.1 mL/min (A) (by C-G formula based on SCr of 1.25 mg/dL (H)). Liver Function Tests: Recent Labs  Lab 07/17/21 1454  AST 34  ALT 16  ALKPHOS 86  BILITOT 1.3*  PROT 6.0*  ALBUMIN 3.3*   Recent Labs  Lab 07/17/21 1454  LIPASE 39  No results for input(s): AMMONIA in the last 168 hours. Coagulation profile Recent Labs  Lab 07/17/21 1454  INR 1.5*    CBC: Recent Labs  Lab 07/17/21 1454  WBC 10.9*  NEUTROABS 9.1*  HGB 10.2*  HCT 32.1*  MCV 94.4  PLT 97*   Cardiac Enzymes: No results for input(s): CKTOTAL, CKMB, CKMBINDEX, TROPONINI in the last 168 hours. BNP (last 3 results) No results for input(s): PROBNP in the last 8760 hours. CBG: No results for input(s): GLUCAP in the last 168 hours. D-Dimer: No results for input(s): DDIMER in the last 72 hours. Hgb A1c: No results for input(s): HGBA1C in the last 72 hours. Lipid Profile: No results for input(s): CHOL, HDL, LDLCALC, TRIG, CHOLHDL, LDLDIRECT in the last 72 hours. Thyroid function studies: Recent Labs    07/17/21 01-20-2051  TSH 2.581   Anemia work up: Recent Labs    07/17/21 Jan 20, 2103  FERRITIN 85   Sepsis Labs: Recent Labs  Lab  07/17/21 1454  WBC 10.9*    Microbiology Recent Results (from the past 240 hour(s))  Resp Panel by RT-PCR (Flu A&B, Covid) Nasopharyngeal Swab     Status: None   Collection Time: 07/17/21  2:51 PM   Specimen: Nasopharyngeal Swab; Nasopharyngeal(NP) swabs in vial transport medium  Result Value Ref Range Status   SARS Coronavirus 2 by RT PCR NEGATIVE NEGATIVE Final    Comment: (NOTE) SARS-CoV-2 target nucleic acids are NOT DETECTED.  The SARS-CoV-2 RNA is generally detectable in upper respiratory specimens during the acute phase of infection. The lowest concentration of SARS-CoV-2 viral copies this assay can detect is 138 copies/mL. A negative result does not preclude SARS-Cov-2 infection and should not be used as the sole basis for treatment or other patient management decisions. A negative result may occur with  improper specimen collection/handling, submission of specimen other than nasopharyngeal swab, presence of viral mutation(s) within the areas targeted by this assay, and inadequate number of viral copies(<138 copies/mL). A negative result must be combined with clinical observations, patient history, and epidemiological information. The expected result is Negative.  Fact Sheet for Patients:  EntrepreneurPulse.com.au  Fact Sheet for Healthcare Providers:  IncredibleEmployment.be  This test is no t yet approved or cleared by the [place] FDA and  has been authorized for detection and/or diagnosis of SARS-CoV-2 by FDA under an Emergency Use Authorization (EUA). This EUA will remain  in effect (meaning this test can be used) for the duration of the COVID-19 declaration under Section 564(b)(1) of the Act, 21 U.S.C.section 360bbb-3(b)(1), unless the authorization is terminated  or revoked sooner.       Influenza A by PCR NEGATIVE NEGATIVE Final   Influenza B by PCR NEGATIVE NEGATIVE Final    Comment: (NOTE) The Xpert Xpress  SARS-CoV-2/FLU/RSV plus assay is intended as an aid in the diagnosis of influenza from Nasopharyngeal swab specimens and should not be used as a sole basis for treatment. Nasal washings and aspirates are unacceptable for Xpert Xpress SARS-CoV-2/FLU/RSV testing.  Fact Sheet for Patients: EntrepreneurPulse.com.au  Fact Sheet for Healthcare Providers: IncredibleEmployment.be  This test is not yet approved or cleared by the [place] FDA and has been authorized for detection and/or diagnosis of SARS-CoV-2 by FDA under an Emergency Use Authorization (EUA). This EUA will remain in effect (meaning this test can be used) for the duration of the COVID-19 declaration under Section 564(b)(1) of the Act, 21 U.S.C. section 360bbb-3(b)(1), unless the authorization is terminated or revoked.  Performed at [facility], [place] [address], [place],  [address]     Procedures and diagnostic studies:  DG Ribs Unilateral Left  Result Date: 07/17/2021 CLINICAL DATA:  Left-sided chest wall pain. Pt mentioned a h/o falls. Pt stated that her upper left side chest is painful when breathing or moving. Pt also stated that it is sore to the touch. Hx; A-fib, chronic diastolic heart failure, high cholesterol, HTN EXAM: LEFT RIBS - 2 VIEW COMPARISON:  None. FINDINGS: No acute displaced fracture or other bone lesions are seen involving the ribs. Persistent cardiomegaly. IMPRESSION: No acute displaced left rib fracture. Please note, nondisplaced rib fractures may be occult on radiograph. Electronically Signed   By: Iven Finn M.D.   On: 07/17/2021 22:55   CT ABDOMEN PELVIS W CONTRAST  Result Date: 07/17/2021 CLINICAL DATA:  Abdominal distension. EXAM: CT ABDOMEN AND PELVIS WITH CONTRAST TECHNIQUE: Multidetector CT imaging of the abdomen and pelvis was performed using the standard protocol following bolus administration of intravenous contrast. CONTRAST:  156mL  OMNIPAQUE IOHEXOL 300 MG/ML  SOLN COMPARISON:  May 19, 2021 FINDINGS: Lower chest: Mild atelectasis is seen within the bilateral lung bases. There is a small right pleural effusion. Hepatobiliary: There is diffuse fatty infiltration of the liver parenchyma. No focal liver abnormality is seen. Status post cholecystectomy. No biliary dilatation. Pancreas: Pancreatic duct is visualized and measures 5 mm in diameter. This is stable in size when compared to the prior study. The pancreatic parenchyma is otherwise unremarkable. Spleen: Normal in size without focal abnormality. Adrenals/Urinary Tract: Adrenal glands are unremarkable. Kidneys are normal in size, without renal calculi or hydronephrosis. A 1.5 cm diameter cyst is seen along the anterolateral aspect of the mid to lower right kidney. Bladder is unremarkable. Stomach/Bowel: Stomach is within normal limits. The appendix is not identified. Surgically anastomosed bowel is again seen along the midline of the lower abdomen and pelvis. This bowel loop is stable in caliber and is persistently dilated. Vascular/Lymphatic: Aortic atherosclerosis. No enlarged abdominal or pelvic lymph nodes. Reproductive: Uterus and bilateral adnexa are unremarkable. 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 This hernia contains fat, fluid and multiple loops of nondilated small bowel. A stable 8.4 cm x 4.4 cm ventral hernia is seen along the midline of the anterior pelvic wall, adjacent to the previously noted ostomy site. This also contains fat and nondilated small bowel loops. A moderate amount of abdominopelvic ascites is seen. Musculoskeletal: There is moderate to marked severity anasarca throughout the abdominal and pelvic walls. Multilevel degenerative changes are seen throughout the thoracolumbar spine with approximately 5 mm anterolisthesis of the L4 vertebral body on L5. IMPRESSION: 1. Small right pleural  effusion. 2. Stable postoperative changes with a left lower quadrant ostomy site and large, stable parastomal hernia. 3. Stable ventral hernia along the midline of the anterior pelvic wall, adjacent to the previously noted ostomy site. 4. Evidence of prior cholecystectomy. 5. Moderate severity abdominopelvic ascites. 6. Moderate to marked severity anasarca. 7. Aortic atherosclerosis. Aortic Atherosclerosis (ICD10-I70.0). Electronically Signed   By: Virgina Norfolk M.D.   On: 07/17/2021 17:49   US Abdomen Limited  Result Date: 07/18/2021 CLINICAL DATA:  Abdominal distension, concern for ascites. Please perform ascites search ultrasound and ultrasound-guided paracentesis as indicated. EXAM: LIMITED ABDOMEN ULTRASOUND FOR ASCITES TECHNIQUE: Limited ultrasound survey for ascites was performed in all four abdominal quadrants. COMPARISON:  CT abdomen pelvis-07/17/2021 FINDINGS: Sonographic evaluation of the abdomen demonstrates a  trace amount of perihepatic ascites (image 4), too small to allow for safe therapeutic or diagnostic paracentesis. No paracentesis attempted. IMPRESSION: Trace amount of perihepatic ascites, too small to allow for safe ultrasound-guided therapeutic or diagnostic paracentesis. No paracentesis attempted. Electronically Signed   By: Sandi Mariscal M.D.   On: 07/18/2021 10:45   DG Chest Portable 1 View  Result Date: 07/17/2021 CLINICAL DATA:  Shortness of breath. EXAM: PORTABLE CHEST 1 VIEW COMPARISON:  July 02, 2021. FINDINGS: Stable cardiomegaly. Pneumothorax or pleural effusion is noted. Left lung is clear. Stable right basilar subsegmental atelectasis or scarring is noted. Bony thorax is unremarkable. IMPRESSION: Stable right basilar subsegmental atelectasis or scarring. Electronically Signed   By: Marijo Conception M.D.   On: 07/17/2021 15:23    Medications:    allopurinol  100 mg Oral Daily   amiodarone  200 mg Oral Daily   apixaban  2.5 mg Oral BID   atorvastatin  20 mg Oral  QPM   colchicine  0.6 mg Oral Daily   folic acid  1 mg Oral q AM   furosemide  40 mg Intravenous Q12H   magnesium oxide  400 mg Oral Daily   metoprolol succinate  100 mg Oral Daily   pantoprazole  40 mg Oral BID   potassium chloride  20 mEq Oral QODAY   sodium chloride flush  3 mL Intravenous Q12H   spironolactone  25 mg Oral Daily   thiamine  100 mg Oral Daily   cyanocobalamin  1,000 mcg Oral Daily   Continuous Infusions:  sodium chloride       LOS: 1 day   Geradine Girt  [facility]   How to contact the [facility] Attending or Consulting provider [place] or covering provider during after hours [place], for this patient?  Check the care team in [facility] and look for a) attending/consulting TRH provider listed and b) the [facility] team listed Log into [email or web address] and use [place]'s universal password to access. If you do not have the password, please contact the hospital operator. Locate the [facility] provider you are looking for under [facility] and page to a number that you can be directly reached. If you still have difficulty reaching the provider, please page the [facility] (Director on Call) for the Hospitalists listed on amion for assistance.  07/18/2021, 11:41 AM

## 2021-07-18 NOTE — Consult Note (Addendum)
Consultation  Referring Provider: [facility]/ Eliseo Squires  Primary Care Physician:  Seward Carol, MD Primary Gastroenterologist:  unassigned  Reason for Consultation: New onset ascites, anasarca question underlying liver disease  HPI: Maria Travis is a 80 y.o. female , who was admitted through the emergency room last evening after she had presented with complaints of progressive weakness and shortness of breath.  She has also had worsening lower extremity edema over the past couple of weeks and has noted that her abdomen has become distended and swollen as well. Patient has known chronic congestive heart failure with most recent EF at 25 to 23%, combined systolic and diastolic, history of atrial fibrillation, hypertension, hyperlipidemia and is status post sigmoid resection and colostomy for what sounds like complicated diverticulitis around 2014. She had an admission here in February 2022 with atrial fibrillation and new onset of systolic heart failure-cath at that time showed normal coronaries She was admitted again in July 2022 for acute exacerbation of congestive heart failure, was diuresed, and Entresto was discontinued I believe secondary to worsening renal function. She was seen by cardiology as an outpatient a couple of weeks ago restarted on Lasix but due to worsening creatinine this was decreased to every other day.  Patient in the emergency room she was noted to be hypoxic and with a grossly distended abdomen and significant lower extremity edema. 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 CT scan done in July 2022 Had shown a small right effusion, enlarged heart especially right sided with reflux of contrast into  the IVC and hepatic veins consistent with right heart failure, at that time liver was reported as normal, also with chronic pancreatic ductal dilation.  Labs today BNP 2700+ WBC 10.9/hemoglobin 10.3/platelets 97 Liver test within normal limits with exception of T bili at 1.3 BUN 21/creatinine 1.3 INR 1.5 in setting of Eliquis Troponin 44, 58 Hepatitis C antibody positive  Patient does not have any prior history of liver disease that she is aware of, unaware of any prior history of hepatitis or diagnosis of hepatitis C. She used to drink fairly heavily for about 16 to 17 years by her report but stopped longer than a year ago and moved to [place] to be with her family in February.  She is also status postcholecystectomy.  Patient has been seen by cardiology, has been started on IV Lasix, and 2D echo is pending Paracentesis has also been ordered   Past Medical History:  Diagnosis Date   Atrial fibrillation ([place])    Chronic diastolic (congestive) heart failure ([place])    Diverticulitis    High cholesterol    Hypertension     Past Surgical History:  Procedure Laterality Date   CARDIOVERSION N/A 12/05/2020   Procedure: CARDIOVERSION;  Surgeon: Sanda Klein, MD;  Location: [facility];  Service: Cardiovascular;  Laterality: N/A;   CHOLECYSTECTOMY     COLOSTOMY     RIGHT/LEFT HEART CATH AND CORONARY ANGIOGRAPHY N/A 12/04/2020   Procedure: RIGHT/LEFT HEART CATH AND CORONARY ANGIOGRAPHY;  Surgeon: Belva Crome, MD;  Location: [facility];  Service: Cardiovascular;  Laterality: N/A;   TEE WITHOUT CARDIOVERSION N/A 12/05/2020   Procedure: TRANSESOPHAGEAL ECHOCARDIOGRAM (TEE);  Surgeon: Sanda Klein,  MD;  Location: [facility];  Service: Cardiovascular;  Laterality: N/A;    Prior to Admission medications   Medication Sig Start Date End Date Taking? Authorizing Provider  acetaminophen (TYLENOL) 325 MG tablet Take 650 mg by mouth every 6 (six) hours as needed for mild pain  or headache.   Yes [provider]  allopurinol (ZYLOPRIM) 100 MG tablet Take 100 mg by mouth daily.   Yes [provider]  amiodarone (PACERONE) 200 MG tablet Take 1 tablet (200 mg total) by mouth daily. 12/08/20  Yes Amponsah, Charisse March, MD  apixaban (ELIQUIS) 2.5 MG TABS tablet Take 2.5 mg by mouth 2 (two) times daily.   Yes [provider]  atorvastatin (LIPITOR) 20 MG tablet Take 20 mg by mouth every evening.   Yes [provider]  colchicine 0.6 MG tablet Take 0.6 mg by mouth daily.   Yes [provider]  folic acid (FOLVITE) 1 MG tablet Take 1 mg by mouth in the morning.   Yes [provider]  furosemide (LASIX) 40 MG tablet Take 1 tablet (40 mg total) by mouth daily. Patient taking differently: Take 40 mg by mouth every other day. 07/01/21  Yes Donato Heinz, MD  Loratadine 10 MG CAPS Take 10 mg by mouth daily.   Yes [provider]  magnesium oxide (MAG-OX) 400 MG tablet Take 1 tablet (400 mg total) by mouth daily. 03/31/21  Yes Donato Heinz, MD  metoprolol succinate (TOPROL-XL) 100 MG 24 hr tablet Take 0.5 tablets (50 mg total) by mouth daily. Take with or immediately following a meal. Patient taking differently: Take 100 mg by mouth daily. 01/09/21  Yes Cleaver, Jossie Ng, NP  pantoprazole (PROTONIX) 40 MG tablet Take 1 tablet (40 mg total) by mouth 2 (two) times daily. 05/21/21  Yes Regalado, Belkys A, MD  Potassium Chloride ER 20 MEQ TBCR Take 20 mEq by mouth daily. Patient taking differently: Take 20 mEq by mouth every other day. Take with furosemide 07/01/21  Yes Donato Heinz, MD  thiamine (VITAMIN B-1) 100 MG tablet Take 100 mg by mouth daily.   Yes [provider]  vitamin B-12 1000 MCG tablet Take 1 tablet (1,000 mcg total) by mouth daily. 05/22/21  Yes Regalado, Belkys A, MD  spironolactone (ALDACTONE) 25 MG tablet Take 0.5 tablets (12.5 mg total) by mouth daily. ON HOLD AS OF 3/11 per Dr.  Gardiner Rhyme, see phone note Patient not taking: No sig reported 01/02/21 05/19/21  Donato Heinz, MD    Current Facility-Administered Medications  Medication Dose Route Frequency Provider Last Rate Last Admin   0.9 %  sodium chloride infusion  250 mL Intravenous PRN Norins, Heinz Knuckles, MD       acetaminophen (TYLENOL) tablet 650 mg  650 mg Oral Q4H PRN Norins, Heinz Knuckles, MD       allopurinol (ZYLOPRIM) tablet 100 mg  100 mg Oral Daily Carmin Muskrat, MD   100 mg at 07/18/21 1032   amiodarone (PACERONE) tablet 200 mg  200 mg Oral Daily Carmin Muskrat, MD   200 mg at 07/18/21 1030   apixaban (ELIQUIS) tablet 2.5 mg  2.5 mg Oral BID Norins, Heinz Knuckles, MD   2.5 mg at 07/18/21 1031   atorvastatin (LIPITOR) tablet 20 mg  20 mg Oral QPM Norins, Heinz Knuckles, MD   20 mg at 07/17/21 2137   colchicine tablet 0.6 mg  0.6 mg Oral Daily Carmin Muskrat, MD       folic  acid (FOLVITE) tablet 1 mg  1 mg Oral q AM Norins, Heinz Knuckles, MD   1 mg at 07/18/21 1032   furosemide (LASIX) injection 40 mg  40 mg Intravenous Q12H Fay Records, MD   40 mg at 07/18/21 0313   ketorolac (TORADOL) 15 MG/ML injection 15 mg  15 mg Intravenous Q6H PRN Norins, Heinz Knuckles, MD       magnesium oxide (MAG-OX) tablet 400 mg  400 mg Oral Daily Campbell Stall P, DO   185 mg at 07/18/21 1029   metoprolol succinate (TOPROL-XL) 24 hr tablet 100 mg  100 mg Oral Daily Norins, Heinz Knuckles, MD   100 mg at 07/18/21 1031   ondansetron (ZOFRAN) injection 4 mg  4 mg Intravenous Q6H PRN Norins, Heinz Knuckles, MD       pantoprazole (PROTONIX) EC tablet 40 mg  40 mg Oral BID Neena Rhymes, MD   40 mg at 07/18/21 1031   potassium chloride (KLOR-CON) CR tablet 20 mEq  20 mEq Oral Myrene Buddy, MD   20 mEq at 07/17/21 2137   sodium chloride flush (NS) 0.9 % injection 3 mL  3 mL Intravenous Q12H Norins, Heinz Knuckles, MD   3 mL at 07/18/21 1032   sodium chloride flush (NS) 0.9 % injection 3 mL  3 mL Intravenous PRN Norins, Heinz Knuckles, MD        spironolactone (ALDACTONE) tablet 25 mg  25 mg Oral Daily Norins, Heinz Knuckles, MD   25 mg at 07/18/21 1031   thiamine tablet 100 mg  100 mg Oral Daily Norins, Heinz Knuckles, MD   100 mg at 07/18/21 1031   vitamin B-12 (CYANOCOBALAMIN) tablet 1,000 mcg  1,000 mcg Oral Daily Neena Rhymes, MD   1,000 mcg at 07/18/21 1033   Current Outpatient Medications  Medication Sig Dispense Refill   acetaminophen (TYLENOL) 325 MG tablet Take 650 mg by mouth every 6 (six) hours as needed for mild pain or headache.     allopurinol (ZYLOPRIM) 100 MG tablet Take 100 mg by mouth daily.     amiodarone (PACERONE) 200 MG tablet Take 1 tablet (200 mg total) by mouth daily. 90 tablet 1   apixaban (ELIQUIS) 2.5 MG TABS tablet Take 2.5 mg by mouth 2 (two) times daily.     atorvastatin (LIPITOR) 20 MG tablet Take 20 mg by mouth every evening.     colchicine 0.6 MG tablet Take 0.6 mg by mouth daily.     folic acid (FOLVITE) 1 MG tablet Take 1 mg by mouth in the morning.     furosemide (LASIX) 40 MG tablet Take 1 tablet (40 mg total) by mouth daily. (Patient taking differently: Take 40 mg by mouth every other day.) 90 tablet 3   Loratadine 10 MG CAPS Take 10 mg by mouth daily.     magnesium oxide (MAG-OX) 400 MG tablet Take 1 tablet (400 mg total) by mouth daily. 60 tablet 3   metoprolol succinate (TOPROL-XL) 100 MG 24 hr tablet Take 0.5 tablets (50 mg total) by mouth daily. Take with or immediately following a meal. (Patient taking differently: Take 100 mg by mouth daily.) 90 tablet 1   pantoprazole (PROTONIX) 40 MG tablet Take 1 tablet (40 mg total) by mouth 2 (two) times daily. 30 tablet 1   Potassium Chloride ER 20 MEQ TBCR Take 20 mEq by mouth daily. (Patient taking differently: Take 20 mEq by mouth every other day. Take with furosemide) 90 tablet 3  thiamine (VITAMIN B-1) 100 MG tablet Take 100 mg by mouth daily.     vitamin B-12 1000 MCG tablet Take 1 tablet (1,000 mcg total) by mouth daily. 30 tablet 0    Allergies  as of 07/17/2021   (No Known Allergies)    No family history on file.  Social History   Socioeconomic History   Marital status: Single                   Occupational History   Occupation: retired  Tobacco Use   Smoking status: Never   Smokeless tobacco: Never  Vaping Use   Vaping Use: Never used  Substance and Sexual Activity   Alcohol use: Not Currently    Comment: weekends   Drug use: No   Review of Systems: Pertinent positive and negative review of systems were noted in the above HPI section.  All other review of systems was otherwise negative.   Physical Exam: Vital signs in last 24 hours:su Temp:  [98.2 F (36.8 C)-98.5 F (36.9 C)] 98.5 F (36.9 C) (09/24 0700) Pulse Rate:  [65-84] 84 (09/24 1030) Resp:  [16-31] 26 (09/24 1030) BP: (140-175)/(82-106) 159/92 (09/24 1030) SpO2:  [93 %-100 %] 99 % (09/24 1030) Weight:  [54 kg] 54 kg (09/23 1325)   General:   Alert,  Well-developed, well-nourished, elderly African-American female pleasant and cooperative in NAD.  Daughter at bedside Head:  Normocephalic and atraumatic. Eyes:  Sclera clear, no icterus.   Conjunctiva pink. Ears:  Normal auditory acuity. Nose:  No deformity, discharge,  or lesions. Mouth:  No deformity or lesions.   Neck:  Supple; no masses or thyromegaly.  No JVD Lungs: Basilar crackles  heart:  Regular rate and rhythm; systolic murmur, with gallop Abdomen: Protuberant, no definite fluid wave, nontense no definite hepatomegaly, she has a large parastomal hernia surrounding the left lower quadrant colostomy somewhat tender Rectal: Not done Msk:  Symmetrical without gross deformities. . Pulses:  Normal pulses noted. Extremities: 1-2+ edema bilateral lower extremities to the shins Neurologic:  Alert and  oriented x4;  grossly normal neurologically. Skin:  Intact without significant lesions or rashes.. Psych:  Alert and cooperative. Normal mood and affect.  Intake/Output from previous  day: 09/23 0701 - 09/24 0700 In: -  Out: 900 [Urine:900] Intake/Output this shift: Total I/O In: -  Out: 400 [Stool:400]  Lab Results: Recent Labs    07/17/21 1454  WBC 10.9*  HGB 10.2*  HCT 32.1*  PLT 97*   BMET Recent Labs    07/17/21 1454 07/18/21 0341  NA 142 140  K 4.3 3.6  CL 112* 110  CO2 21* 20*  GLUCOSE 86 76  BUN 21 19  CREATININE 1.30* 1.25*  CALCIUM 9.0 9.3   LFT Recent Labs    07/17/21 1454  PROT 6.0*  ALBUMIN 3.3*  AST 34  ALT 16  ALKPHOS 86  BILITOT 1.3*   PT/INR Recent Labs    07/17/21 1454  LABPROT 17.8*  INR 1.5*   Hepatitis Panel Recent Labs    07/17/21 2104  HEPBSAG NON REACTIVE  HCVAB Reactive*  HEPAIGM NON REACTIVE  HEPBIGM NON REACTIVE     IMPRESSION:  #60  80 year old African-American female with known combined systolic and diastolic congestive heart failure admitted with progressive weakness and shortness of breath as well as lower extremity edema and abdominal distention and fullness progressive over the past couple of weeks. Work-up thus far consistent with acute exacerbation of congestive heart failure.  I suspect the ascites is secondary to right-sided heart failure rather than underlying chronic liver disease. CT of the abdomen done July 2022 had confirmed an enlarged heart particularly right-sided dilation with reflux of contrast into the IVC and hepatic vein consistent with right heart failure. She does not have any definite cirrhosis by labs or imaging though does have history of EtOH abuse which she discontinued about a year ago.  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 I agree with the Advanced Practitioner's note, impression and recommendations.  Majority the medical decision-making in the formulation of the assessment and plan were performed by me.  Low PLT raise ? 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  When you do paracentesis get a total protein (and albumin). High protein fluid goes w/ RHF, low protein w/ portal htn.  Gatha Mayer, MD, [facility] 07/18/2021 3:52 PM

## 2021-07-19 DIAGNOSIS — R188 Other ascites: Secondary | ICD-10-CM | POA: Diagnosis not present

## 2021-07-19 DIAGNOSIS — R0602 Shortness of breath: Secondary | ICD-10-CM | POA: Diagnosis not present

## 2021-07-19 DIAGNOSIS — R140 Abdominal distension (gaseous): Secondary | ICD-10-CM | POA: Diagnosis not present

## 2021-07-19 DIAGNOSIS — I5043 Acute on chronic combined systolic (congestive) and diastolic (congestive) heart failure: Secondary | ICD-10-CM | POA: Diagnosis not present

## 2021-07-19 DIAGNOSIS — E43 Unspecified severe protein-calorie malnutrition: Secondary | ICD-10-CM | POA: Diagnosis present

## 2021-07-19 DIAGNOSIS — R531 Weakness: Secondary | ICD-10-CM

## 2021-07-19 DIAGNOSIS — E8770 Fluid overload, unspecified: Secondary | ICD-10-CM | POA: Diagnosis not present

## 2021-07-19 DIAGNOSIS — R768 Other specified abnormal immunological findings in serum: Secondary | ICD-10-CM | POA: Diagnosis not present

## 2021-07-19 LAB — BASIC METABOLIC PANEL
Anion gap: 14 (ref 5–15)
BUN: 22 mg/dL (ref 8–23)
CO2: 20 mmol/L — ABNORMAL LOW (ref 22–32)
Calcium: 9 mg/dL (ref 8.9–10.3)
Chloride: 104 mmol/L (ref 98–111)
Creatinine, Ser: 1.3 mg/dL — ABNORMAL HIGH (ref 0.44–1.00)
GFR, Estimated: 41 mL/min — ABNORMAL LOW (ref >60)
Glucose, Bld: 105 mg/dL — ABNORMAL HIGH (ref 70–99)
Potassium: 3.2 mmol/L — ABNORMAL LOW (ref 3.5–5.1)
Sodium: 138 mmol/L (ref 135–145)

## 2021-07-19 LAB — CBC
HCT: 33.9 % — ABNORMAL LOW (ref 36.0–46.0)
Hemoglobin: 11.1 g/dL — ABNORMAL LOW (ref 12.0–15.0)
MCH: 29.4 pg (ref 26.0–34.0)
MCHC: 32.7 g/dL (ref 30.0–36.0)
MCV: 89.9 fL (ref 80.0–100.0)
Platelets: 124 10*3/uL — ABNORMAL LOW (ref 150–400)
RBC: 3.77 MIL/uL — ABNORMAL LOW (ref 3.87–5.11)
RDW: 16.3 % — ABNORMAL HIGH (ref 11.5–15.5)
WBC: 12.4 10*3/uL — ABNORMAL HIGH (ref 4.0–10.5)
nRBC: 0.2 % (ref 0.0–0.2)

## 2021-07-19 LAB — PROTIME-INR
INR: 1.8 — ABNORMAL HIGH (ref 0.8–1.2)
Prothrombin Time: 21 seconds — ABNORMAL HIGH (ref 11.4–15.2)

## 2021-07-19 MED ORDER — POTASSIUM CHLORIDE CRYS ER 20 MEQ PO TBCR
40.0000 meq | EXTENDED_RELEASE_TABLET | Freq: Once | ORAL | Status: AC
  Administered 2021-07-19: 40 meq via ORAL
  Filled 2021-07-19: qty 2

## 2021-07-19 MED ORDER — LOSARTAN POTASSIUM 25 MG PO TABS
25.0000 mg | ORAL_TABLET | Freq: Every day | ORAL | Status: DC
  Administered 2021-07-19 – 2021-07-22 (×4): 25 mg via ORAL
  Filled 2021-07-19 (×4): qty 1

## 2021-07-19 MED ORDER — DICLOFENAC SODIUM 1 % EX GEL
2.0000 g | Freq: Four times a day (QID) | CUTANEOUS | Status: DC
  Administered 2021-07-19 – 2021-07-20 (×5): 2 g via TOPICAL
  Filled 2021-07-19: qty 100

## 2021-07-19 MED ORDER — FUROSEMIDE 10 MG/ML IJ SOLN
40.0000 mg | Freq: Two times a day (BID) | INTRAMUSCULAR | Status: DC
  Administered 2021-07-19 – 2021-07-21 (×5): 40 mg via INTRAVENOUS
  Filled 2021-07-19 (×5): qty 4

## 2021-07-19 NOTE — Progress Notes (Signed)
Progress Note  Patient Name: Maria Travis Date of Encounter: 07/19/2021  [facility] Cardiologist: Donato Heinz, MD   Subjective   Ongoing SOB  Inpatient Medications    Scheduled Meds:  allopurinol  100 mg Oral Daily   amiodarone  200 mg Oral Daily   apixaban  2.5 mg Oral BID   atorvastatin  20 mg Oral QPM   colchicine  0.6 mg Oral Daily   diclofenac Sodium  2 g Topical QID   folic acid  1 mg Oral q AM   furosemide  40 mg Intravenous Q12H   lidocaine  1 patch Transdermal Q24H   magnesium oxide  400 mg Oral Daily   metoprolol succinate  100 mg Oral Daily   pantoprazole  40 mg Oral BID   potassium chloride  20 mEq Oral QODAY   potassium chloride  40 mEq Oral Once   sodium chloride flush  3 mL Intravenous Q12H   spironolactone  25 mg Oral Daily   thiamine  100 mg Oral Daily   cyanocobalamin  1,000 mcg Oral Daily   Continuous Infusions:  sodium chloride     PRN Meds: sodium chloride, acetaminophen, ondansetron (ZOFRAN) IV, sodium chloride flush   Vital Signs    Vitals:   07/18/21 1522 07/18/21 2027 07/18/21 2320 07/19/21 0422  BP:  (!) 159/92 (!) 165/95 (!) 161/84  Pulse:  84 92 79  Resp:  17 (!) 22 16  Temp:  97.9 F (36.6 C) 99.1 F (37.3 C) 99.9 F (37.7 C)  TempSrc:  Oral Oral Oral  SpO2:  91% 97% 97%  Weight: 53 kg   52.6 kg  Height: 5\' 3"  (1.6 m)       Intake/Output Summary (Last 24 hours) at 07/19/2021 0958 Last data filed at 07/19/2021 1696 Gross per 24 hour  Intake 200 ml  Output 2100 ml  Net -1900 ml   Last 3 Weights 07/19/2021 07/18/2021 07/17/2021  Weight (lbs) 115 lb 15.4 oz 116 lb 13.5 oz 119 lb 0.8 oz  Weight (kg) 52.6 kg 53 kg 54 kg      Telemetry    SR - Personally Reviewed  ECG    N/a - Personally Reviewed  Physical Exam   GEN: No acute distress.   Neck: elevated JVD Cardiac: RRR, 2/6 systolic murmur LLSB Respiratory: crackles bilateral bases GI: Soft, nontender, non-distended  MS: No edema; No  deformity. Neuro:  Nonfocal  Psych: Normal affect   Labs    High Sensitivity Troponin:   Recent Labs  Lab 07/17/21 1454 07/17/21 1644  TROPONINIHS 44* 58*     Chemistry Recent Labs  Lab 07/17/21 1454 07/18/21 0341 07/19/21 0319  NA 142 140 138  K 4.3 3.6 3.2*  CL 112* 110 104  CO2 21* 20* 20*  GLUCOSE 86 76 105*  BUN 21 19 22   CREATININE 1.30* 1.25* 1.30*  CALCIUM 9.0 9.3 9.0  MG 1.5*  --   --   PROT 6.0*  --   --   ALBUMIN 3.3*  --   --   AST 34  --   --   ALT 16  --   --   ALKPHOS 86  --   --   BILITOT 1.3*  --   --   GFRNONAA 41* 43* 41*  ANIONGAP 9 10 14     Lipids No results for input(s): CHOL, TRIG, HDL, LABVLDL, LDLCALC, CHOLHDL in the last 168 hours.  Hematology Recent Labs  Lab 07/17/21 1454  07/19/21 0319  WBC 10.9* 12.4*  RBC 3.40* 3.77*  HGB 10.2* 11.1*  HCT 32.1* 33.9*  MCV 94.4 89.9  MCH 30.0 29.4  MCHC 31.8 32.7  RDW 16.7* 16.3*  PLT 97* 124*   Thyroid  Recent Labs  Lab 07/17/21 2052  TSH 2.581    BNP Recent Labs  Lab 07/17/21 1444 07/17/21 2052  BNP 2,777.9* 4,004.4*    DDimer No results for input(s): DDIMER in the last 168 hours.   Radiology    DG Ribs Unilateral Left  Result Date: 07/17/2021 CLINICAL DATA:  Left-sided chest wall pain. Pt mentioned a h/o falls. Pt stated that her upper left side chest is painful when breathing or moving. Pt also stated that it is sore to the touch. Hx; A-fib, chronic diastolic heart failure, high cholesterol, HTN EXAM: LEFT RIBS - 2 VIEW COMPARISON:  None. FINDINGS: No acute displaced fracture or other bone lesions are seen involving the ribs. Persistent cardiomegaly. IMPRESSION: No acute displaced left rib fracture. Please note, nondisplaced rib fractures may be occult on radiograph. Electronically Signed   By: Iven Finn M.D.   On: 07/17/2021 22:55   CT ABDOMEN PELVIS W CONTRAST  Result Date: 07/17/2021 CLINICAL DATA:  Abdominal distension. EXAM: CT ABDOMEN AND PELVIS WITH CONTRAST  TECHNIQUE: Multidetector CT imaging of the abdomen and pelvis was performed using the standard protocol following bolus administration of intravenous contrast. CONTRAST:  151mL OMNIPAQUE IOHEXOL 300 MG/ML  SOLN COMPARISON:  May 19, 2021 FINDINGS: Lower chest: Mild atelectasis is seen within the bilateral lung bases. There is a small right pleural effusion. Hepatobiliary: There is diffuse fatty infiltration of the liver parenchyma. No focal liver abnormality is seen. Status post cholecystectomy. No biliary dilatation. Pancreas: Pancreatic duct is visualized and measures 5 mm in diameter. This is stable in size when compared to the prior study. The pancreatic parenchyma is otherwise unremarkable. Spleen: Normal in size without focal abnormality. Adrenals/Urinary Tract: Adrenal glands are unremarkable. Kidneys are normal in size, without renal calculi or hydronephrosis. A 1.5 cm diameter cyst is seen along the anterolateral aspect of the mid to lower right kidney. Bladder is unremarkable. Stomach/Bowel: Stomach is within normal limits. The appendix is not identified. Surgically anastomosed bowel is again seen along the midline of the lower abdomen and pelvis. This bowel loop is stable in caliber and is persistently dilated. Vascular/Lymphatic: Aortic atherosclerosis. No enlarged abdominal or pelvic lymph nodes. Reproductive: Uterus and bilateral adnexa are unremarkable. 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 This hernia contains fat, fluid and multiple loops of nondilated small bowel. A stable 8.4 cm x 4.4 cm ventral hernia is seen along the midline of the anterior pelvic wall, adjacent to the previously noted ostomy site. This also contains fat and nondilated small bowel loops. A moderate amount of abdominopelvic ascites is seen. Musculoskeletal: There is moderate to marked severity anasarca throughout the abdominal and pelvic walls. Multilevel  degenerative changes are seen throughout the thoracolumbar spine with approximately 5 mm anterolisthesis of the L4 vertebral body on L5. IMPRESSION: 1. Small right pleural effusion. 2. Stable postoperative changes with a left lower quadrant ostomy site and large, stable parastomal hernia. 3. Stable ventral hernia along the midline of the anterior pelvic wall, adjacent to the previously noted ostomy site. 4. Evidence of prior cholecystectomy. 5. Moderate severity abdominopelvic ascites. 6. Moderate to marked severity anasarca. 7. Aortic atherosclerosis. Aortic Atherosclerosis (ICD10-I70.0). Electronically Signed  By: Virgina Norfolk M.D.   On: 07/17/2021 17:49   US Abdomen Limited  Result Date: 07/18/2021 CLINICAL DATA:  Abdominal distension, concern for ascites. Please perform ascites search ultrasound and ultrasound-guided paracentesis as indicated. EXAM: LIMITED ABDOMEN ULTRASOUND FOR ASCITES TECHNIQUE: Limited ultrasound survey for ascites was performed in all four abdominal quadrants. COMPARISON:  CT abdomen pelvis-07/17/2021 FINDINGS: Sonographic evaluation of the abdomen demonstrates a trace amount of perihepatic ascites (image 4), too small to allow for safe therapeutic or diagnostic paracentesis. No paracentesis attempted. IMPRESSION: Trace amount of perihepatic ascites, too small to allow for safe ultrasound-guided therapeutic or diagnostic paracentesis. No paracentesis attempted. Electronically Signed   By: Sandi Mariscal M.D.   On: 07/18/2021 10:45   DG Chest Portable 1 View  Result Date: 07/17/2021 CLINICAL DATA:  Shortness of breath. EXAM: PORTABLE CHEST 1 VIEW COMPARISON:  July 02, 2021. FINDINGS: Stable cardiomegaly. Pneumothorax or pleural effusion is noted. Left lung is clear. Stable right basilar subsegmental atelectasis or scarring is noted. Bony thorax is unremarkable. IMPRESSION: Stable right basilar subsegmental atelectasis or scarring. Electronically Signed   By: Marijo Conception  M.D.   On: 07/17/2021 15:23   ECHOCARDIOGRAM COMPLETE  Result Date: 07/18/2021    ECHOCARDIOGRAM REPORT   Patient Name:   Maria Travis Date of Exam: 07/18/2021 Medical Rec #:  [id number]        Height:       64.0 in Accession #:    [id number]       Weight:       119.0 lb Date of Birth:  Mar 06, 1941        BSA:          1.569 m Patient Age:    62 years         BP:           161/94 mmHg Patient Gender: F                HR:           82 bpm. Exam Location:  Inpatient Procedure: 2D Echo Indications:    acute diastolic chf  History:        Patient has prior history of Echocardiogram examinations, most                 recent 02/09/2021. Arrythmias:Atrial Fibrillation; Risk                 Factors:Hypertension and Dyslipidemia.  Sonographer:    Johny Chess RDCS Referring Phys: Tsel[place]ai Dezza  1. Left ventricular ejection fraction, by estimation, is 35 to 40%. The left ventricle has moderately decreased function. The left ventricle demonstrates global hypokinesis. Left ventricular diastolic parameters are consistent with Grade III diastolic dysfunction (restrictive). Elevated left atrial pressure.  2. Right ventricular systolic function is normal. The right ventricular size is normal. There is severely elevated pulmonary artery systolic pressure.  3. Left atrial size was moderately dilated.  4. Right atrial size was moderately dilated.  5. The mitral valve is abnormal. Mild to moderate mitral valve regurgitation. No evidence of mitral stenosis. Moderate mitral annular calcification.  6. The tricuspid valve is abnormal. Tricuspid valve regurgitation is severe.  7. The aortic valve is tricuspid. There is moderate calcification of the aortic valve. There is moderate thickening of the aortic valve. Aortic valve regurgitation is mild to moderate. No aortic stenosis is present.  8. The inferior vena cava is dilated in size with >50% respiratory variability, suggesting right  atrial pressure of 8 mmHg.  FINDINGS  Left Ventricle: Left ventricular ejection fraction, by estimation, is 35 to 40%. The left ventricle has moderately decreased function. The left ventricle demonstrates global hypokinesis. The left ventricular internal cavity size was normal in size. There is no left ventricular hypertrophy. Left ventricular diastolic parameters are consistent with Grade III diastolic dysfunction (restrictive). Elevated left atrial pressure. Right Ventricle: The right ventricular size is normal. No increase in right ventricular wall thickness. Right ventricular systolic function is normal. There is severely elevated pulmonary artery systolic pressure. The tricuspid regurgitant velocity is 3.84 m/s, and with an assumed right atrial pressure of 8 mmHg, the estimated right ventricular systolic pressure is 67.8 mmHg. Left Atrium: Left atrial size was moderately dilated. Right Atrium: Right atrial size was moderately dilated. Pericardium: There is no evidence of pericardial effusion. Mitral Valve: The mitral valve is abnormal. There is moderate thickening of the mitral valve leaflet(s). There is moderate calcification of the mitral valve leaflet(s). Moderate mitral annular calcification. Mild to moderate mitral valve regurgitation. No evidence of mitral valve stenosis. Tricuspid Valve: There is systolic flow reversal of hepatic veins consistent with severe TR. The tricuspid valve is abnormal. Tricuspid valve regurgitation is severe. No evidence of tricuspid stenosis. Aortic Valve: The aortic valve is tricuspid. There is moderate calcification of the aortic valve. There is moderate thickening of the aortic valve. There is moderate aortic valve annular calcification. Aortic valve regurgitation is mild to moderate. Aortic regurgitation PHT measures 340 msec. No aortic stenosis is present. Pulmonic Valve: The pulmonic valve was not well visualized. Pulmonic valve regurgitation is mild. No evidence of pulmonic stenosis. Aorta: The  aortic root is normal in size and structure. Venous: The inferior vena cava is dilated in size with greater than 50% respiratory variability, suggesting right atrial pressure of 8 mmHg. IAS/Shunts: No atrial level shunt detected by color flow Doppler.  LEFT VENTRICLE PLAX 2D LVIDd:         4.50 cm     Diastology LVIDs:         3.80 cm     LV e' medial:    4.24 cm/s LV PW:         0.90 cm     LV E/e' medial:  22.7 LV IVS:        0.80 cm     LV e' lateral:   7.18 cm/s LVOT diam:     1.80 cm     LV E/e' lateral: 13.4 LV SV:         33 LV SV Index:   21 LVOT Area:     2.54 cm  LV Volumes (MOD) LV vol d, MOD A2C: 55.6 ml LV vol s, MOD A2C: 42.4 ml LV SV MOD A2C:     13.2 ml RIGHT VENTRICLE            IVC RV S prime:     8.38 cm/s  IVC diam: 1.90 cm TAPSE (M-mode): 1.1 cm LEFT ATRIUM             Index       RIGHT ATRIUM           Index LA diam:        4.00 cm 2.55 cm/m  RA Area:     21.80 cm LA Vol (A2C):   70.9 ml 45.18 ml/m RA Volume:   63.30 ml  40.34 ml/m LA Vol (A4C):   70.2 ml 44.73 ml/m LA Biplane Vol: 71.9 ml  45.82 ml/m  AORTIC VALVE LVOT Vmax:   90.80 cm/s LVOT Vmean:  56.100 cm/s LVOT VTI:    0.128 m AI PHT:      340 msec  AORTA Ao Root diam: 2.60 cm Ao Asc diam:  3.10 cm MITRAL VALVE                 TRICUSPID VALVE MV Area (PHT): 5.02 cm      TR Peak grad:   59.0 mmHg MV Decel Time: 151 msec      TR Vmax:        384.00 cm/s MR Peak grad:    118.8 mmHg MR Mean grad:    74.0 mmHg   SHUNTS MR Vmax:         545.00 cm/s Systemic VTI:  0.13 m MR Vmean:        408.0 cm/s  Systemic Diam: 1.80 cm MR PISA:         1.01 cm MR PISA Eff ROA: 7 mm MR PISA Radius:  0.40 cm MV E velocity: 96.40 cm/s MV A velocity: 41.10 cm/s MV E/A ratio:  2.35 Carlyle Dolly MD Electronically signed by Carlyle Dolly MD Signature Date/Time: 07/18/2021/5:34:47 PM    Final     Cardiac Studies     Patient Profile     Maria Travis is a 80 y.o. 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 Vanita Panda. Assessment & Plan    1.Acute on chronic combined systolic/diastolic HF, recurrent LV dysfunction - 11/2020 echo LVEF 25-30% - 11/2020 cath no significant CAD - 01/2021 echo LVEF 55%, no WMAs, grade II dd.  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 Outpatient diuretics have been complicated by decrease in renal function - odd BNP pattern, elevated at 2700 on admit later same day reported at 4000  -neg 2.3 L yesterday and 3.2 L since admission. She received IV lasix 40mg  x 3 doses yesterday. Reported weights 119-->115 lbs since admission. Overall stable renal function.  - she is on aldactone 25, toprol 100.  - she had been on entresto and farxiga in the past, stopped during prior AKI and ongoing issues with labile renal dysfunction. Try losartan 25mg  daily and monitor renal function during admission     2. Elevated troponin - mild with mild delta in setting of HF - at this time do not suspect ACS     3. Afib - she is on amio, coumadin - EKG on admit showed SR, tele continues to show SR   4. Ascites - to be evaluated by GI - US showed only trace fluid  For questions or updates, please contact [place] Please consult [email or web address] for contact info under        Signed, Carlyle Dolly, MD  07/19/2021, 9:58 AM

## 2021-07-19 NOTE — Progress Notes (Signed)
Progress Note    Bricia Taher  IWP:[id number] DOB: 15-Apr-1941  DOA: 07/17/2021 PCP: Seward Carol, MD    Brief Narrative:    Medical records reviewed and are as summarized below:  Maria Travis is an 80 y.o. female increasing SOB and abdominal pain for several days. She has had distention of her abdomen and her colostomy bag. Increased swelling of the LE is also reported. Dr. Nechama Guard, cardiology, who saw the patient 07/01/21 was called and referred the patient to [facility].  Responding to IV diuresis.    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 Troponin mildly elevated x 2. No EKG. Cardiology has seen patient - Lasix 40 mg IV q 12- seems to be responding well- weight down  -tele admit  -paracentesis-- U/S shows NOT enough ascites to safely do -cards/GI consult appreciated   Left-sided chest wall pain.  -no obvious fx -lidocaine patch   PAF - in stable rhythm on exam -eliquis   HTN -monitor while diuresing  -adjust meds as able   CKD stage IIIB -monitor with diuresis  Hypokelamia -replete  Family Communication/Anticipated D/C date and plan/Code Status   DVT prophylaxis: eliquis Code Status: Full Code.  Disposition Plan: Status is: Inpatient  Remains inpatient appropriate because:Inpatient level of care appropriate due to severity of illness  Dispo: The patient is from: Home              Anticipated d/c is to: Home              Patient currently is not medically stable to d/c.   Difficult to place patient No         Medical  Consultants:   GI cards  Subjective:   C/o b/l shoulder pain  Objective:    Vitals:   07/18/21 1522 07/18/21 2027 07/18/21 2320 07/19/21 0422  BP:  (!) 159/92 (!) 165/95 (!) 161/84  Pulse:  84 92 79  Resp:  17 (!) 22 16  Temp:  97.9 F (36.6 C) 99.1 F (37.3 C) 99.9 F (37.7 C)  TempSrc:  Oral Oral Oral  SpO2:  91% 97% 97%  Weight: 53 kg   52.6 kg  Height: 5\' 3"  (1.6 m)       Intake/Output Summary (Last 24 hours) at 07/19/2021 0947 Last data filed at 07/19/2021 5053 Gross per 24 hour  Intake 200 ml  Output 2100 ml  Net -1900 ml   Filed Weights   07/17/21 1325 07/18/21 1522 07/19/21 0422  Weight: 54 kg 53 kg 52.6 kg    Exam:  General: Appearance:    elderly female in no acute distress     Lungs:     Diminished, respirations unlabored  Heart:    Normal heart rate.   MS:   All extremities are intact. Minimal LE edema   Neurologic:   Awake, alert, pleasant and cooperative      Data Reviewed:   I have personally reviewed following labs and imaging studies:  Labs: Labs show the following:   Basic Metabolic Panel: Recent Labs  Lab 07/17/21 1454 07/18/21 0341 07/19/21 0319  NA 142 140 138  K 4.3 3.6 3.2*  CL 112* 110 104  CO2 21* 20* 20*  GLUCOSE 86 76 105*  BUN 21 19 22   CREATININE 1.30* 1.25* 1.30*  CALCIUM 9.0 9.3 9.0  MG 1.5*  --   --    GFR Estimated Creatinine Clearance: 28.1 mL/min (A) (by C-G formula based on SCr of 1.3 mg/dL (H)). Liver Function Tests: Recent Labs  Lab 07/17/21 1454  AST 34  ALT 16  ALKPHOS 86  BILITOT 1.3*  PROT 6.0*  ALBUMIN 3.3*   Recent Labs  Lab 07/17/21 1454  LIPASE 39   No results for input(s): AMMONIA in the last 168 hours. Coagulation profile Recent Labs  Lab 07/17/21 1454 07/19/21 0319  INR 1.5* 1.8*    CBC: Recent Labs  Lab 07/17/21 1454 07/19/21 0319  WBC 10.9* 12.4*  NEUTROABS 9.1*  --   HGB 10.2* 11.1*  HCT 32.1* 33.9*  MCV 94.4 89.9  PLT 97* 124*   Cardiac Enzymes: No  results for input(s): CKTOTAL, CKMB, CKMBINDEX, TROPONINI in the last 168 hours. BNP (last 3 results) No results for input(s): PROBNP in the last 8760 hours. CBG: No results for input(s): GLUCAP in the last 168 hours. D-Dimer: No results for input(s): DDIMER in the last 72 hours. Hgb A1c: No results for input(s): HGBA1C in the last 72 hours. Lipid Profile: No results for input(s): CHOL, HDL, LDLCALC, TRIG, CHOLHDL, LDLDIRECT in the last 72 hours. Thyroid function studies: Recent Labs    07/17/21 01-15-2051  TSH 2.581   Anemia work up: Recent Labs    07/17/21 January 15, 2103  FERRITIN 85   Sepsis Labs: Recent Labs  Lab 07/17/21 1454 07/19/21 0319  WBC 10.9* 12.4*    Microbiology Recent Results (from the past 240 hour(s))  Resp Panel by RT-PCR (Flu A&B, Covid) Nasopharyngeal Swab     Status: None   Collection Time: 07/17/21  2:51 PM   Specimen: Nasopharyngeal Swab; Nasopharyngeal(NP) swabs in vial transport medium  Result Value Ref Range Status   SARS Coronavirus 2 by RT PCR NEGATIVE NEGATIVE Final    Comment: (NOTE) SARS-CoV-2 target nucleic acids are NOT DETECTED.  The SARS-CoV-2 RNA is generally detectable in upper respiratory specimens during the acute phase of infection. The lowest concentration of SARS-CoV-2 viral copies this assay can detect is 138 copies/mL. A negative result does not preclude SARS-Cov-2 infection and should not be used as the sole basis for treatment or other patient management decisions. A negative result may occur with  improper specimen collection/handling, submission of specimen other than nasopharyngeal swab, presence of viral mutation(s) within the areas targeted by this assay, and inadequate number of viral copies(<138 copies/mL). A negative result must be combined with clinical observations, patient history, and epidemiological information. The expected result is Negative.  Fact Sheet for Patients:   EntrepreneurPulse.com.au  Fact Sheet for Healthcare Providers:  IncredibleEmployment.be  This test is no t yet approved or cleared by the [place] FDA and  has been authorized for detection and/or diagnosis of SARS-CoV-2 by FDA under an Emergency Use Authorization (EUA). This EUA will remain  in effect (meaning this test can be used) for the duration of the COVID-19 declaration under Section 564(b)(1) of the Act, 21 U.S.C.section 360bbb-3(b)(1), unless the authorization is terminated  or revoked sooner.       Influenza A by PCR NEGATIVE NEGATIVE Final   Influenza B by PCR NEGATIVE NEGATIVE Final  Comment: (NOTE) The Xpert Xpress SARS-CoV-2/FLU/RSV plus assay is intended as an aid in the diagnosis of influenza from Nasopharyngeal swab specimens and should not be used as a sole basis for treatment. Nasal washings and aspirates are unacceptable for Xpert Xpress SARS-CoV-2/FLU/RSV testing.  Fact Sheet for Patients: EntrepreneurPulse.com.au  Fact Sheet for Healthcare Providers: IncredibleEmployment.be  This test is not yet approved or cleared by the [place] FDA and has been authorized for detection and/or diagnosis of SARS-CoV-2 by FDA under an Emergency Use Authorization (EUA). This EUA will remain in effect (meaning this test can be used) for the duration of the COVID-19 declaration under Section 564(b)(1) of the Act, 21 U.S.C. section 360bbb-3(b)(1), unless the authorization is terminated or revoked.  Performed at [facility], [place] [address] [place], [place] [address]     Procedures and diagnostic studies:  DG Ribs Unilateral Left  Result Date: 07/17/2021 CLINICAL DATA:  Left-sided chest wall pain. Pt mentioned a h/o falls. Pt stated that her upper left side chest is painful when breathing or moving. Pt also stated that it is sore to the touch. Hx; A-fib, chronic diastolic heart  failure, high cholesterol, HTN EXAM: LEFT RIBS - 2 VIEW COMPARISON:  None. FINDINGS: No acute displaced fracture or other bone lesions are seen involving the ribs. Persistent cardiomegaly. IMPRESSION: No acute displaced left rib fracture. Please note, nondisplaced rib fractures may be occult on radiograph. Electronically Signed   By: Iven Finn M.D.   On: 07/17/2021 22:55   CT ABDOMEN PELVIS W CONTRAST  Result Date: 07/17/2021 CLINICAL DATA:  Abdominal distension. EXAM: CT ABDOMEN AND PELVIS WITH CONTRAST TECHNIQUE: Multidetector CT imaging of the abdomen and pelvis was performed using the standard protocol following bolus administration of intravenous contrast. CONTRAST:  151mL OMNIPAQUE IOHEXOL 300 MG/ML  SOLN COMPARISON:  May 19, 2021 FINDINGS: Lower chest: Mild atelectasis is seen within the bilateral lung bases. There is a small right pleural effusion. Hepatobiliary: There is diffuse fatty infiltration of the liver parenchyma. No focal liver abnormality is seen. Status post cholecystectomy. No biliary dilatation. Pancreas: Pancreatic duct is visualized and measures 5 mm in diameter. This is stable in size when compared to the prior study. The pancreatic parenchyma is otherwise unremarkable. Spleen: Normal in size without focal abnormality. Adrenals/Urinary Tract: Adrenal glands are unremarkable. Kidneys are normal in size, without renal calculi or hydronephrosis. A 1.5 cm diameter cyst is seen along the anterolateral aspect of the mid to lower right kidney. Bladder is unremarkable. Stomach/Bowel: Stomach is within normal limits. The appendix is not identified. Surgically anastomosed bowel is again seen along the midline of the lower abdomen and pelvis. This bowel loop is stable in caliber and is persistently dilated. Vascular/Lymphatic: Aortic atherosclerosis. No enlarged abdominal or pelvic lymph nodes. Reproductive: Uterus and bilateral adnexa are unremarkable. 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 This hernia contains fat, fluid and multiple loops of nondilated small bowel. A stable 8.4 cm x 4.4 cm ventral hernia is seen along the midline of the anterior pelvic wall, adjacent to the previously noted ostomy site. This also contains fat and nondilated small bowel loops. A moderate amount of abdominopelvic ascites is seen. Musculoskeletal: There is moderate to marked severity anasarca throughout the abdominal and pelvic walls. Multilevel degenerative changes are seen throughout the thoracolumbar spine with approximately 5 mm anterolisthesis of the L4 vertebral body on L5. IMPRESSION: 1. Small right pleural effusion.  2. Stable postoperative changes with a left lower quadrant ostomy site and large, stable parastomal hernia. 3. Stable ventral hernia along the midline of the anterior pelvic wall, adjacent to the previously noted ostomy site. 4. Evidence of prior cholecystectomy. 5. Moderate severity abdominopelvic ascites. 6. Moderate to marked severity anasarca. 7. Aortic atherosclerosis. Aortic Atherosclerosis (ICD10-I70.0). Electronically Signed   By: Virgina Norfolk M.D.   On: 07/17/2021 17:49   US Abdomen Limited  Result Date: 07/18/2021 CLINICAL DATA:  Abdominal distension, concern for ascites. Please perform ascites search ultrasound and ultrasound-guided paracentesis as indicated. EXAM: LIMITED ABDOMEN ULTRASOUND FOR ASCITES TECHNIQUE: Limited ultrasound survey for ascites was performed in all four abdominal quadrants. COMPARISON:  CT abdomen pelvis-07/17/2021 FINDINGS: Sonographic evaluation of the abdomen demonstrates a trace amount of perihepatic ascites (image 4), too small to allow for safe therapeutic or diagnostic paracentesis. No paracentesis attempted. IMPRESSION: Trace amount of perihepatic ascites, too small to allow for safe ultrasound-guided therapeutic or diagnostic paracentesis. No paracentesis attempted.  Electronically Signed   By: Sandi Mariscal M.D.   On: 07/18/2021 10:45   DG Chest Portable 1 View  Result Date: 07/17/2021 CLINICAL DATA:  Shortness of breath. EXAM: PORTABLE CHEST 1 VIEW COMPARISON:  July 02, 2021. FINDINGS: Stable cardiomegaly. Pneumothorax or pleural effusion is noted. Left lung is clear. Stable right basilar subsegmental atelectasis or scarring is noted. Bony thorax is unremarkable. IMPRESSION: Stable right basilar subsegmental atelectasis or scarring. Electronically Signed   By: Marijo Conception M.D.   On: 07/17/2021 15:23   ECHOCARDIOGRAM COMPLETE  Result Date: 07/18/2021    ECHOCARDIOGRAM REPORT   Patient Name:   Maria Travis Date of Exam: 07/18/2021 Medical Rec #:  [id number]        Height:       64.0 in Accession #:    [id number]       Weight:       119.0 lb Date of Birth:  May 26, 1941        BSA:          1.569 m Patient Age:    81 years         BP:           161/94 mmHg Patient Gender: F                HR:           82 bpm. Exam Location:  Inpatient Procedure: 2D Echo Indications:    acute diastolic chf  History:        Patient has prior history of Echocardiogram examinations, most                 recent 02/09/2021. Arrythmias:Atrial Fibrillation; Risk                 Factors:Hypertension and Dyslipidemia.  Sonographer:    Johny Chess RDCS Referring Phys: [place]  1. Left ventricular ejection fraction, by estimation, is 35 to 40%. The left ventricle has moderately decreased function. The left ventricle demonstrates global hypokinesis. Left ventricular diastolic parameters are consistent with Grade III diastolic dysfunction (restrictive). Elevated left atrial pressure.  2. Right ventricular systolic function is normal. The right ventricular size is normal. There is severely elevated pulmonary artery systolic pressure.  3. Left atrial size was moderately dilated.  4. Right atrial size was moderately dilated.  5. The mitral valve is abnormal. Mild to  moderate mitral valve regurgitation. No evidence of mitral stenosis. Moderate mitral annular calcification.  6. The tricuspid valve is abnormal. Tricuspid valve regurgitation is severe.  7. The aortic valve is tricuspid. There is moderate calcification of the aortic valve. There is moderate thickening of the aortic valve. Aortic valve regurgitation is mild to moderate. No aortic stenosis is present.  8. The inferior vena cava is dilated in size with >50% respiratory variability, suggesting right atrial pressure of 8 mmHg. FINDINGS  Left Ventricle: Left ventricular ejection fraction, by estimation, is 35 to 40%. The left ventricle has moderately decreased function. The left ventricle demonstrates global hypokinesis. The left ventricular internal cavity size was normal in size. There is no left ventricular hypertrophy. Left ventricular diastolic parameters are consistent with Grade III diastolic dysfunction (restrictive). Elevated left atrial pressure. Right Ventricle: The right ventricular size is normal. No increase in right ventricular wall thickness. Right ventricular systolic function is normal. There is severely elevated pulmonary artery systolic pressure. The tricuspid regurgitant velocity is 3.84 m/s, and with an assumed right atrial pressure of 8 mmHg, the estimated right ventricular systolic pressure is 93.2 mmHg. Left Atrium: Left atrial size was moderately dilated. Right Atrium: Right atrial size was moderately dilated. Pericardium: There is no evidence of pericardial effusion. Mitral Valve: The mitral valve is abnormal. There is moderate thickening of the mitral valve leaflet(s). There is moderate calcification of the mitral valve leaflet(s). Moderate mitral annular calcification. Mild to moderate mitral valve regurgitation. No evidence of mitral valve stenosis. Tricuspid Valve: There is systolic flow reversal of hepatic veins consistent with severe TR. The tricuspid valve is abnormal. Tricuspid valve  regurgitation is severe. No evidence of tricuspid stenosis. Aortic Valve: The aortic valve is tricuspid. There is moderate calcification of the aortic valve. There is moderate thickening of the aortic valve. There is moderate aortic valve annular calcification. Aortic valve regurgitation is mild to moderate. Aortic regurgitation PHT measures 340 msec. No aortic stenosis is present. Pulmonic Valve: The pulmonic valve was not well visualized. Pulmonic valve regurgitation is mild. No evidence of pulmonic stenosis. Aorta: The aortic root is normal in size and structure. Venous: The inferior vena cava is dilated in size with greater than 50% respiratory variability, suggesting right atrial pressure of 8 mmHg. IAS/Shunts: No atrial level shunt detected by color flow Doppler.  LEFT VENTRICLE PLAX 2D LVIDd:         4.50 cm     Diastology LVIDs:         3.80 cm     LV e' medial:    4.24 cm/s LV PW:         0.90 cm     LV E/e' medial:  22.7 LV IVS:        0.80 cm     LV e' lateral:   7.18 cm/s LVOT diam:     1.80 cm     LV E/e' lateral: 13.4 LV SV:         33 LV SV Index:   21 LVOT Area:     2.54 cm  LV Volumes (MOD) LV vol d, MOD A2C: 55.6 ml LV vol s, MOD A2C: 42.4 ml LV SV MOD A2C:     13.2 ml RIGHT VENTRICLE            IVC RV S prime:     8.38 cm/s  IVC diam: 1.90 cm TAPSE (M-mode): 1.1 cm LEFT ATRIUM             Index       RIGHT ATRIUM  Index LA diam:        4.00 cm 2.55 cm/m  RA Area:     21.80 cm LA Vol (A2C):   70.9 ml 45.18 ml/m RA Volume:   63.30 ml  40.34 ml/m LA Vol (A4C):   70.2 ml 44.73 ml/m LA Biplane Vol: 71.9 ml 45.82 ml/m  AORTIC VALVE LVOT Vmax:   90.80 cm/s LVOT Vmean:  56.100 cm/s LVOT VTI:    0.128 m AI PHT:      340 msec  AORTA Ao Root diam: 2.60 cm Ao Asc diam:  3.10 cm MITRAL VALVE                 TRICUSPID VALVE MV Area (PHT): 5.02 cm      TR Peak grad:   59.0 mmHg MV Decel Time: 151 msec      TR Vmax:        384.00 cm/s MR Peak grad:    118.8 mmHg MR Mean grad:    74.0 mmHg    SHUNTS MR Vmax:         545.00 cm/s Systemic VTI:  0.13 m MR Vmean:        408.0 cm/s  Systemic Diam: 1.80 cm MR PISA:         1.01 cm MR PISA Eff ROA: 7 mm MR PISA Radius:  0.40 cm MV E velocity: 96.40 cm/s MV A velocity: 41.10 cm/s MV E/A ratio:  2.35 Carlyle Dolly MD Electronically signed by Carlyle Dolly MD Signature Date/Time: 07/18/2021/5:34:47 PM    Final     Medications:    allopurinol  100 mg Oral Daily   amiodarone  200 mg Oral Daily   apixaban  2.5 mg Oral BID   atorvastatin  20 mg Oral QPM   colchicine  0.6 mg Oral Daily   diclofenac Sodium  2 g Topical QID   folic acid  1 mg Oral q AM   furosemide  40 mg Intravenous Q12H   lidocaine  1 patch Transdermal Q24H   magnesium oxide  400 mg Oral Daily   metoprolol succinate  100 mg Oral Daily   pantoprazole  40 mg Oral BID   potassium chloride  20 mEq Oral QODAY   potassium chloride  40 mEq Oral Once   sodium chloride flush  3 mL Intravenous Q12H   spironolactone  25 mg Oral Daily   thiamine  100 mg Oral Daily   cyanocobalamin  1,000 mcg Oral Daily   Continuous Infusions:  sodium chloride       LOS: 2 days   Geradine Girt  [facility]   How to contact the [facility] Attending or Consulting provider 7A - 7P or covering provider during after hours 7P -7A, for this patient?  Check the care team in [facility] and look for a) attending/consulting [facility] provider listed and b) the [facility] team listed Log into [email or web address] and use [place] [facility]'s universal password to access. If you do not have the password, please contact the hospital operator. Locate the [facility] provider you are looking for under [facility] and page to a number that you can be directly reached. If you still have difficulty reaching the provider, please page the [facility] (Director on Call) for the Hospitalists listed on amion for assistance.  07/19/2021, 9:47 AM

## 2021-07-19 NOTE — Evaluation (Signed)
Physical Therapy Evaluation Patient Details Name: Maria Travis MRN: [id number] DOB: [birth year] Today's Date: 07/19/2021  History of Present Illness  80 y.o. female presents to [facility] hospital on 07/17/2021 with increasing SOB and abdominal pain as well as LE swelling. Pt admitted for management of volume overload. 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  Clinical Impression  Pt presents to PT with deficits in functional mobility, gait, balance, power, endurance, strength, and with significant R shoulder pain. Pt currently needs physical assistance to perform all bed mobility and transfers, with significant LE weakness in addition to limited use of RUE due to pain. Pt is at a high falls risk due to weakness and imbalance at this time. Pt will benefit from continued aggressive mobilization and PT services to reduce falls risk. PT recommends SNF placement at this time.       Recommendations for follow up therapy are one component of a multi-disciplinary discharge planning process, led by the attending physician.  Recommendations may be updated based on patient status, additional functional criteria and insurance authorization.  Follow Up Recommendations SNF    Equipment Recommendations  3in1 (PT)    Recommendations for Other Services       Precautions / Restrictions Precautions Precautions: Fall Restrictions Weight Bearing Restrictions: No      Mobility  Bed Mobility Overal bed mobility: Needs Assistance Bed Mobility: Supine to Sit     Supine to sit: Mod assist;HOB elevated          Transfers Overall transfer level: Needs assistance Equipment used: Rolling walker (2 wheeled) Transfers: Sit to/from [facility] Sit to Stand: Max assist Stand pivot transfers: Mod assist       General transfer comment: pt requires significant  assistance to power up into standing  Ambulation/Gait Ambulation/Gait assistance:  (deferred 2/2 weakness and fatigue)              Stairs            Wheelchair Mobility    Modified Rankin (Stroke Patients Only)       Balance Overall balance assessment: Needs assistance Sitting-balance support: No upper extremity supported;Feet supported Sitting balance-Leahy Scale: Fair Sitting balance - Comments: initially needing minA due to posterior lean, progresses to minG   Standing balance support: Bilateral upper extremity supported Standing balance-Leahy Scale: Poor Standing balance comment: reliant on UE support and minA                             Pertinent Vitals/Pain Pain Assessment: Faces Faces Pain Scale: Hurts even more Pain Location: R shoulder and neck Pain Descriptors / Indicators: Grimacing Pain Intervention(s): Monitored during session    Home Living Family/patient expects to be discharged to:: Assisted living               Home Equipment: Walker - 4 wheels;Wheelchair - manual Additional Comments: pt is unable to recall name of ALF    Prior Function Level of Independence: Independent with assistive device(s)         Comments: pt reports ambulating independently with 4 wheeled walker     Hand Dominance        Extremity/Trunk Assessment   Upper Extremity Assessment Upper Extremity Assessment: Generalized weakness;RUE deficits/detail RUE Deficits / Details: pt reporting significant R shoulder pain, AROM of RUE limited to ~40 degrees shoulder flexion with pain. PROM not assessed at  this time    Lower Extremity Assessment Lower Extremity Assessment: Generalized weakness    Cervical / Trunk Assessment Cervical / Trunk Assessment: Kyphotic  Communication   Communication: No difficulties  Cognition Arousal/Alertness: Awake/alert Behavior During Therapy: Flat affect Overall Cognitive Status: No family/caregiver present to  determine baseline cognitive functioning                                 General Comments: pt with slowed processing, increased response times currently. Some reduced awareness of deficits.      General Comments General comments (skin integrity, edema, etc.): VSS on RA    Exercises     Assessment/Plan    PT Assessment Patient needs continued PT services  PT Problem List Decreased strength;Decreased activity tolerance;Decreased balance;Decreased range of motion;Decreased mobility;Decreased cognition;Decreased knowledge of use of DME;Decreased safety awareness;Decreased knowledge of precautions;Pain       PT Treatment Interventions DME instruction;Gait training;Functional mobility training;Therapeutic activities;Therapeutic exercise;Balance training;Neuromuscular re-education;Patient/family education    PT Goals (Current goals can be found in the Care Plan section)  Acute Rehab PT Goals Patient Stated Goal: to improve strength and return to independent mobility PT Goal Formulation: With patient Time For Goal Achievement: 08/02/21 Potential to Achieve Goals: Fair    Frequency Min 2X/week   Barriers to discharge        Co-evaluation               AM-PAC PT "6 Clicks" Mobility  Outcome Measure Help needed turning from your back to your side while in a flat bed without using bedrails?: A Lot Help needed moving from lying on your back to sitting on the side of a flat bed without using bedrails?: A Lot Help needed moving to and from a bed to a chair (including a wheelchair)?: A Lot Help needed standing up from a chair using your arms (e.g., wheelchair or bedside chair)?: A Lot Help needed to walk in hospital room?: A Lot Help needed climbing 3-5 steps with a railing? : Total 6 Click Score: 11    End of Session   Activity Tolerance: Patient limited by pain Patient left: in chair;with call bell/phone within reach;with chair alarm set Nurse Communication:  Mobility status PT Visit Diagnosis: Other abnormalities of gait and mobility (R26.89);Muscle weakness (generalized) (M62.81);Pain Pain - Right/Left: Right Pain - part of body: Shoulder    Time: 9629-5284 PT Time Calculation (min) (ACUTE ONLY): 28 min   Charges:   PT Evaluation $PT Eval Low Complexity: 1 Low          Zenaida Niece, PT, DPT Acute Rehabilitation Pager: [phone]   Zenaida Niece 07/19/2021, 2:43 PM

## 2021-07-19 NOTE — Progress Notes (Addendum)
   Patient Name: Maria Travis Date of Encounter: 07/19/2021, 12:04 PM    Subjective  [place] indicated insufficient ascites to tap  She has had major diuresis - still miserable   Objective  BP (!) 161/84 (BP Location: Left Arm)   Pulse 79   Temp 99.9 F (37.7 C) (Oral)   Resp 16   Ht 5\' 3"  (1.6 m)   Wt 52.6 kg   SpO2 97%   BMI 20.54 kg/m   Elderly and chronically ill w/ protuberant abdomen  Recent Labs  Lab 07/17/21 1454 07/19/21 0319  AST 34  --   ALT 16  --   ALKPHOS 86  --   BILITOT 1.3*  --   PROT 6.0*  --   ALBUMIN 3.3*  --   INR 1.5* 1.8*      Assessment and Plan  Ascites Fatty liver No clear evidence of cirrhosis and I doubt + HCVAb w/ RNA pending Malnutrition - severe  I would continue diuresis and allow IM and cards to manage  We will be available f needed but I do not have anything else to offer - I would stop checking INR as it is unreliable on Eliquis  If HCV RNA + could ask ID re: Tx however it seems to me that may be of limited utility to Tx given life expectancy  Nutrition consult is advised  Signing off  Gatha Mayer, MD, [facility] 07/19/2021 12:09 PM    Gatha Mayer, MD, [facility] 07/19/2021 12:04 PM

## 2021-07-20 ENCOUNTER — Inpatient Hospital Stay (HOSPITAL_COMMUNITY): Payer: Medicare Other

## 2021-07-20 DIAGNOSIS — R0602 Shortness of breath: Secondary | ICD-10-CM | POA: Diagnosis not present

## 2021-07-20 DIAGNOSIS — E8770 Fluid overload, unspecified: Secondary | ICD-10-CM | POA: Diagnosis not present

## 2021-07-20 DIAGNOSIS — I5041 Acute combined systolic (congestive) and diastolic (congestive) heart failure: Secondary | ICD-10-CM

## 2021-07-20 LAB — BASIC METABOLIC PANEL
Anion gap: 10 (ref 5–15)
BUN: 29 mg/dL — ABNORMAL HIGH (ref 8–23)
CO2: 21 mmol/L — ABNORMAL LOW (ref 22–32)
Calcium: 8.7 mg/dL — ABNORMAL LOW (ref 8.9–10.3)
Chloride: 103 mmol/L (ref 98–111)
Creatinine, Ser: 1.36 mg/dL — ABNORMAL HIGH (ref 0.44–1.00)
GFR, Estimated: 39 mL/min — ABNORMAL LOW (ref >60)
Glucose, Bld: 114 mg/dL — ABNORMAL HIGH (ref 70–99)
Potassium: 3.7 mmol/L (ref 3.5–5.1)
Sodium: 134 mmol/L — ABNORMAL LOW (ref 135–145)

## 2021-07-20 LAB — CBC
HCT: 34 % — ABNORMAL LOW (ref 36.0–46.0)
Hemoglobin: 11.3 g/dL — ABNORMAL LOW (ref 12.0–15.0)
MCH: 29.7 pg (ref 26.0–34.0)
MCHC: 33.2 g/dL (ref 30.0–36.0)
MCV: 89.2 fL (ref 80.0–100.0)
Platelets: 133 10*3/uL — ABNORMAL LOW (ref 150–400)
RBC: 3.81 MIL/uL — ABNORMAL LOW (ref 3.87–5.11)
RDW: 16 % — ABNORMAL HIGH (ref 11.5–15.5)
WBC: 12.7 10*3/uL — ABNORMAL HIGH (ref 4.0–10.5)
nRBC: 0 % (ref 0.0–0.2)

## 2021-07-20 LAB — BRAIN NATRIURETIC PEPTIDE: B Natriuretic Peptide: 1978.3 pg/mL — ABNORMAL HIGH (ref 0.0–100.0)

## 2021-07-20 LAB — MAGNESIUM: Magnesium: 1.3 mg/dL — ABNORMAL LOW (ref 1.7–2.4)

## 2021-07-20 MED ORDER — COLCHICINE 0.3 MG HALF TABLET
0.3000 mg | ORAL_TABLET | Freq: Every day | ORAL | Status: DC
  Administered 2021-07-21: 0.3 mg via ORAL
  Filled 2021-07-20: qty 0.5
  Filled 2021-07-20: qty 1

## 2021-07-20 MED ORDER — MAGNESIUM SULFATE 4 GM/100ML IV SOLN
4.0000 g | Freq: Once | INTRAVENOUS | Status: AC
  Administered 2021-07-20: 4 g via INTRAVENOUS
  Filled 2021-07-20: qty 100

## 2021-07-20 MED ORDER — LIDOCAINE 5 % EX PTCH
3.0000 | MEDICATED_PATCH | CUTANEOUS | Status: DC
  Administered 2021-07-20 – 2021-07-22 (×3): 3 via TRANSDERMAL
  Filled 2021-07-20 (×3): qty 3

## 2021-07-20 NOTE — Progress Notes (Signed)
Progress Note  Patient Name: Maria Travis Date of Encounter: 07/20/2021  [facility] Cardiologist: Donato Heinz, MD   Subjective   Still feels SOB and weak.  Inpatient Medications    Scheduled Meds:  allopurinol  100 mg Oral Daily   amiodarone  200 mg Oral Daily   apixaban  2.5 mg Oral BID   atorvastatin  20 mg Oral QPM   [START ON 07/21/2021] colchicine  0.3 mg Oral Daily   folic acid  1 mg Oral q AM   furosemide  40 mg Intravenous BID   lidocaine  3 patch Transdermal Q24H   losartan  25 mg Oral Daily   magnesium oxide  400 mg Oral Daily   metoprolol succinate  100 mg Oral Daily   pantoprazole  40 mg Oral BID   potassium chloride  20 mEq Oral QODAY   sodium chloride flush  3 mL Intravenous Q12H   spironolactone  25 mg Oral Daily   thiamine  100 mg Oral Daily   cyanocobalamin  1,000 mcg Oral Daily   Continuous Infusions:  sodium chloride     PRN Meds: sodium chloride, acetaminophen, ondansetron (ZOFRAN) IV, sodium chloride flush   Vital Signs    Vitals:   07/18/21 2320 07/19/21 0422 07/19/21 2046 07/20/21 0312  BP: (!) 165/95 (!) 161/84 133/73 123/74  Pulse: 92 79 79 71  Resp: (!) 22 16 16 16   Temp: 99.1 F (37.3 C) 99.9 F (37.7 C) 98.5 F (36.9 C) 98.2 F (36.8 C)  TempSrc: Oral Oral Oral Oral  SpO2: 97% 97% 99% 96%  Weight:  52.6 kg  52.2 kg  Height:        Intake/Output Summary (Last 24 hours) at 07/20/2021 1801 Last data filed at 07/20/2021 0600 Gross per 24 hour  Intake 200 ml  Output 625 ml  Net -425 ml   Last 3 Weights 07/20/2021 07/19/2021 07/18/2021  Weight (lbs) 115 lb 1.3 oz 115 lb 15.4 oz 116 lb 13.5 oz  Weight (kg) 52.2 kg 52.6 kg 53 kg      Telemetry    SR - Personally Reviewed  ECG    N/a - Personally Reviewed  Physical Exam   GEN: No acute distress.   Neck: No JVD Cardiac: RRR, 2/6 SEM, no rubs, or gallops.  Respiratory: bilateral crackles GI: Soft, nontender, non-distended  MS: No edema; No  deformity. Neuro:  Nonfocal  Psych: Normal affect   Labs    High Sensitivity Troponin:   Recent Labs  Lab 07/17/21 1454 07/17/21 1644  TROPONINIHS 44* 58*     Chemistry Recent Labs  Lab 07/17/21 1454 07/18/21 0341 07/19/21 0319 07/20/21 0225  NA 142 140 138 134*  K 4.3 3.6 3.2* 3.7  CL 112* 110 104 103  CO2 21* 20* 20* 21*  GLUCOSE 86 76 105* 114*  BUN 21 19 22  29*  CREATININE 1.30* 1.25* 1.30* 1.36*  CALCIUM 9.0 9.3 9.0 8.7*  MG 1.5*  --   --  1.3*  PROT 6.0*  --   --   --   ALBUMIN 3.3*  --   --   --   AST 34  --   --   --   ALT 16  --   --   --   ALKPHOS 86  --   --   --   BILITOT 1.3*  --   --   --   GFRNONAA 41* 43* 41* 39*  ANIONGAP 9 10 14  10    Lipids No results for input(s): CHOL, TRIG, HDL, LABVLDL, LDLCALC, CHOLHDL in the last 168 hours.  Hematology Recent Labs  Lab 07/17/21 1454 07/19/21 0319 07/20/21 0225  WBC 10.9* 12.4* 12.7*  RBC 3.40* 3.77* 3.81*  HGB 10.2* 11.1* 11.3*  HCT 32.1* 33.9* 34.0*  MCV 94.4 89.9 89.2  MCH 30.0 29.4 29.7  MCHC 31.8 32.7 33.2  RDW 16.7* 16.3* 16.0*  PLT 97* 124* 133*   Thyroid  Recent Labs  Lab 07/17/21 2052  TSH 2.581    BNP Recent Labs  Lab 07/17/21 1444 07/17/21 2052 07/20/21 0225  BNP 2,777.9* 4,004.4* 1,978.3*    DDimer No results for input(s): DDIMER in the last 168 hours.   Radiology    DG Shoulder Right  Result Date: 07/20/2021 CLINICAL DATA:  RIGHT shoulder pain post fall, pain radiating from shoulder to elbow EXAM: RIGHT SHOULDER - 2+ VIEW COMPARISON:  None FINDINGS: Osseous demineralization. AC joint alignment normal. Visualized RIGHT ribs intact. Minimal calcification adjacent to the humeral head, may represent rotator cuff calcification or minimal chondrocalcinosis. No definite fracture, dislocation, or bone destruction. IMPRESSION: No acute osseous abnormalities. Minimal calcification adjacent to humeral head question rotator cuff calcific tendinitis versus minimal chondrocalcinosis.  Electronically Signed   By: Lavonia Dana M.D.   On: 07/20/2021 10:54    Cardiac Studies  Echo   1. Left ventricular ejection fraction, by estimation, is 35 to 40%. The  left ventricle has moderately decreased function. The left ventricle  demonstrates global hypokinesis. Left ventricular diastolic parameters are  consistent with Grade III diastolic  dysfunction (restrictive). Elevated left atrial pressure.   2. Right ventricular systolic function is normal. The right ventricular  size is normal. There is severely elevated pulmonary artery systolic  pressure.   3. Left atrial size was moderately dilated.   4. Right atrial size was moderately dilated.   5. The mitral valve is abnormal. Mild to moderate mitral valve  regurgitation. No evidence of mitral stenosis. Moderate mitral annular  calcification.   6. The tricuspid valve is abnormal. Tricuspid valve regurgitation is  severe.   7. The aortic valve is tricuspid. There is moderate calcification of the  aortic valve. There is moderate thickening of the aortic valve. Aortic  valve regurgitation is mild to moderate. No aortic stenosis is present.   8. The inferior vena cava is dilated in size with >50% respiratory  variability, suggesting right atrial pressure of 8 mmHg.   Patient Profile     Maria Travis is a 80 y.o. 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 Vanita Panda. Assessment & Plan    1.Acute on chronic combined systolic/diastolic HF, recurrent LV dysfunction - 11/2020 echo LVEF 25-30% - 11/2020 cath no significant CAD - 01/2021 echo LVEF 55%, no WMAs, grade II dd.  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 Outpatient diuretics have been complicated by decrease in renal function -neg 1 L yesterday and -3 L since admission. She received IV lasix 40mg  x 3 doses, and lasix 40 mg IV  BID yesterday and today. Reported weights 54 kg >> 52 kg. Grossly stable renal function.  - she is on aldactone 25, toprol 100.  - she had been on entresto and farxiga in the past, stopped during prior AKI and ongoing issues with labile renal dysfunction. Try losartan 25mg  daily and monitor renal function during admission - she may need a cardiac MRI to further understand nature of HF. This can be done as outpatient after diuresis. She appears quite frail and does not feel well, would continue diuresis for 1-2 days and consider further workup or HF consultation if no progress toward symptom improvement.  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  2. Elevated troponin - mild with mild delta in setting of HF - at this time do not suspect ACS   3. Afib - she is on amio, coumadin - EKG on admit showed SR, tele continues to show SR   4. Ascites - US showed only trace fluid - GI recommends increased diuresis. Not felt to be driven by cirrhosis.  For questions or updates, please contact [place] Please consult [email or web address] for contact info under        Signed, Elouise Munroe, MD  07/20/2021, 6:01 PM

## 2021-07-20 NOTE — Progress Notes (Signed)
Progress Note    Maria Travis  BDZ:[id number] DOB: 15-Jan-1941  DOA: 07/17/2021 PCP: Seward Carol, MD    Brief Narrative:    Medical records reviewed and are as summarized below:  Maria Travis is an 80 y.o. female increasing SOB and abdominal pain for several days. She has had distention of her abdomen and her colostomy bag. Increased swelling of the LE is also reported. Dr. Nechama Guard, cardiology, who saw the patient 07/01/21 was called and referred the patient to [facility].  Responding to IV diuresis.     Assessment/Plan:   Active Problems:   Paroxysmal atrial fibrillation (HCC)   Hypertension   Acute on chronic combined systolic and diastolic CHF (congestive heart failure) (HCC)   Anasarca   Hepatic steatosis   Abdominal ascites   Acute on chronic combined systolic (congestive) and diastolic (congestive) heart failure (HCC)   Left-sided chest wall pain   Abdominal distension   Severe protein-calorie malnutrition (HCC)   Hepatitis C antibody positive in blood      Volume overload from Acute on chronic combined heart failure plus/- cirrhosis   - CXR w/o pulmonary edema. Troponin mildly elevated x 2. No EKG. Cardiology has seen patient - Lasix 40 mg IV q 12- seems to be responding well- weight down  -tele admit  -paracentesis-- U/S shows NOT enough ascites to safely do -cards/GI consult appreciated  -echo: Left ventricular ejection fraction, by estimation, is 35 to 40%. The  left ventricle has moderately decreased function. The left ventricle  demonstrates global hypokinesis. Left ventricular diastolic parameters are  consistent with Grade III diastolic  dysfunction (restrictive).  Left-sided chest wall pain.  -no obvious fx -lidocaine patch   Shoulder pain -lidocaine patch -x ray w/o fracture  PAF - in stable rhythm on exam -eliquis   HTN -monitor while diuresing  -adjust meds as able   CKD stage IIIB -monitor with  diuresis  Hypokelamia -replete  Hypomagnesemia -replete    Family Communication/Anticipated D/C date and plan/Code Status   DVT prophylaxis: eliquis Code Status: DNR Disposition Plan: Status is: Inpatient  Remains inpatient appropriate because:Inpatient level of care appropriate due to severity of illness  Dispo: The patient is from: Home              Anticipated d/c is to: Home              Patient currently is not medically stable to d/c.   Difficult to place patient No         Medical Consultants:   GI cards  Subjective:   C/o shoulder pain  Objective:    Vitals:   07/18/21 2320 07/19/21 0422 07/19/21 2046 07/20/21 0312  BP: (!) 165/95 (!) 161/84 133/73 123/74  Pulse: 92 79 79 71  Resp: (!) 22 16 16 16   Temp: 99.1 F (37.3 C) 99.9 F (37.7 C) 98.5 F (36.9 C) 98.2 F (36.8 C)  TempSrc: Oral Oral Oral Oral  SpO2: 97% 97% 99% 96%  Weight:  52.6 kg  52.2 kg  Height:        Intake/Output Summary (Last 24 hours) at 07/20/2021 1417 Last data filed at 07/20/2021 0600 Gross per 24 hour  Intake 200 ml  Output 625 ml  Net -425 ml   Filed Weights   07/18/21 1522 07/19/21 0422 07/20/21 0312  Weight: 53 kg 52.6 kg 52.2 kg    Exam:   General: Appearance:    Elderly female who appears to be in pain  Lungs:     Poor effort, respirations unlabored  Heart:    Normal heart rate.   MS:   All extremities are intact.    Neurologic:   Awake, alert       Data Reviewed:   I have personally reviewed following labs and imaging studies:  Labs: Labs show the following:   Basic Metabolic Panel: Recent Labs  Lab 07/17/21 1454 07/18/21 0341 07/19/21 0319 07/20/21 0225  NA 142 140 138 134*  K 4.3 3.6 3.2* 3.7  CL 112* 110 104 103  CO2 21* 20* 20* 21*  GLUCOSE 86 76 105* 114*  BUN 21 19 22  29*  CREATININE 1.30* 1.25* 1.30* 1.36*  CALCIUM 9.0 9.3 9.0 8.7*  MG 1.5*  --   --  1.3*   GFR Estimated Creatinine Clearance: 26.7 mL/min (A) (by C-G  formula based on SCr of 1.36 mg/dL (H)). Liver Function Tests: Recent Labs  Lab 07/17/21 1454  AST 34  ALT 16  ALKPHOS 86  BILITOT 1.3*  PROT 6.0*  ALBUMIN 3.3*   Recent Labs  Lab 07/17/21 1454  LIPASE 39   No results for input(s): AMMONIA in the last 168 hours. Coagulation profile Recent Labs  Lab 07/17/21 1454 07/19/21 0319  INR 1.5* 1.8*    CBC: Recent Labs  Lab 07/17/21 1454 07/19/21 0319 07/20/21 0225  WBC 10.9* 12.4* 12.7*  NEUTROABS 9.1*  --   --   HGB 10.2* 11.1* 11.3*  HCT 32.1* 33.9* 34.0*  MCV 94.4 89.9 89.2  PLT 97* 124* 133*   Cardiac Enzymes: No results for input(s): CKTOTAL, CKMB, CKMBINDEX, TROPONINI in the last 168 hours. BNP (last 3 results) No results for input(s): PROBNP in the last 8760 hours. CBG: No results for input(s): GLUCAP in the last 168 hours. D-Dimer: No results for input(s): DDIMER in the last 72 hours. Hgb A1c: No results for input(s): HGBA1C in the last 72 hours. Lipid Profile: No results for input(s): CHOL, HDL, LDLCALC, TRIG, CHOLHDL, LDLDIRECT in the last 72 hours. Thyroid function studies: Recent Labs    07/17/21 01/13/51  TSH 2.581   Anemia work up: Recent Labs    07/17/21 January 13, 2103  FERRITIN 85   Sepsis Labs: Recent Labs  Lab 07/17/21 1454 07/19/21 0319 07/20/21 0225  WBC 10.9* 12.4* 12.7*    Microbiology Recent Results (from the past 240 hour(s))  Resp Panel by RT-PCR (Flu A&B, Covid) Nasopharyngeal Swab     Status: None   Collection Time: 07/17/21  2:51 PM   Specimen: Nasopharyngeal Swab; Nasopharyngeal(NP) swabs in vial transport medium  Result Value Ref Range Status   SARS Coronavirus 2 by RT PCR NEGATIVE NEGATIVE Final    Comment: (NOTE) SARS-CoV-2 target nucleic acids are NOT DETECTED.  The SARS-CoV-2 RNA is generally detectable in upper respiratory specimens during the acute phase of infection. The lowest concentration of SARS-CoV-2 viral copies this assay can detect is 138 copies/mL. A negative  result does not preclude SARS-Cov-2 infection and should not be used as the sole basis for treatment or other patient management decisions. A negative result may occur with  improper specimen collection/handling, submission of specimen other than nasopharyngeal swab, presence of viral mutation(s) within the areas targeted by this assay, and inadequate number of viral copies(<138 copies/mL). A negative result must be combined with clinical observations, patient history, and epidemiological information. The expected result is Negative.  Fact Sheet for Patients:  EntrepreneurPulse.com.au  Fact Sheet for Healthcare Providers:  IncredibleEmployment.be  This test is no  t yet approved or cleared by the [place] and  has been authorized for detection and/or diagnosis of SARS-CoV-2 by FDA under an Emergency Use Authorization (EUA). This EUA will remain  in effect (meaning this test can be used) for the duration of the COVID-19 declaration under Section 564(b)(1) of the Act, 21 U.S.C.section 360bbb-3(b)(1), unless the authorization is terminated  or revoked sooner.       Influenza A by PCR NEGATIVE NEGATIVE Final   Influenza B by PCR NEGATIVE NEGATIVE Final    Comment: (NOTE) The Xpert Xpress SARS-CoV-2/FLU/RSV plus assay is intended as an aid in the diagnosis of influenza from Nasopharyngeal swab specimens and should not be used as a sole basis for treatment. Nasal washings and aspirates are unacceptable for Xpert Xpress SARS-CoV-2/FLU/RSV testing.  Fact Sheet for Patients: EntrepreneurPulse.com.au  Fact Sheet for Healthcare Providers: IncredibleEmployment.be  This test is not yet approved or cleared by the [place] FDA and has been authorized for detection and/or diagnosis of SARS-CoV-2 by FDA under an Emergency Use Authorization (EUA). This EUA will remain in effect (meaning this test can be used)  for the duration of the COVID-19 declaration under Section 564(b)(1) of the Act, 21 U.S.C. section 360bbb-3(b)(1), unless the authorization is terminated or revoked.  Performed at [facility], [place] [address], [place], [place] [address]     Procedures and diagnostic studies:  DG Shoulder Right  Result Date: 07/20/2021 CLINICAL DATA:  RIGHT shoulder pain post fall, pain radiating from shoulder to elbow EXAM: RIGHT SHOULDER - 2+ VIEW COMPARISON:  None FINDINGS: Osseous demineralization. AC joint alignment normal. Visualized RIGHT ribs intact. Minimal calcification adjacent to the humeral head, may represent rotator cuff calcification or minimal chondrocalcinosis. No definite fracture, dislocation, or bone destruction. IMPRESSION: No acute osseous abnormalities. Minimal calcification adjacent to humeral head question rotator cuff calcific tendinitis versus minimal chondrocalcinosis. Electronically Signed   By: Lavonia Dana M.D.   On: 07/20/2021 10:54   ECHOCARDIOGRAM COMPLETE  Result Date: 07/18/2021    ECHOCARDIOGRAM REPORT   Patient Name:   HALIYAH FRYMAN Date of Exam: 07/18/2021 Medical Rec #:  [id number]        Height:       64.0 in Accession #:    [id number]       Weight:       119.0 lb Date of Birth:  August 24, 1941        BSA:          1.569 m Patient Age:    1 years         BP:           161/94 mmHg Patient Gender: F                HR:           82 bpm. Exam Location:  Inpatient Procedure: 2D Echo Indications:    acute diastolic chf  History:        Patient has prior history of Echocardiogram examinations, most                 recent 02/09/2021. Arrythmias:Atrial Fibrillation; Risk                 Factors:Hypertension and Dyslipidemia.  Sonographer:    Johny Chess RDCS Referring Phys: [place]  1. Left ventricular ejection fraction, by estimation, is 35 to 40%. The left ventricle has moderately decreased function. The left ventricle demonstrates global  hypokinesis. Left  ventricular diastolic parameters are consistent with Grade III diastolic dysfunction (restrictive). Elevated left atrial pressure.  2. Right ventricular systolic function is normal. The right ventricular size is normal. There is severely elevated pulmonary artery systolic pressure.  3. Left atrial size was moderately dilated.  4. Right atrial size was moderately dilated.  5. The mitral valve is abnormal. Mild to moderate mitral valve regurgitation. No evidence of mitral stenosis. Moderate mitral annular calcification.  6. The tricuspid valve is abnormal. Tricuspid valve regurgitation is severe.  7. The aortic valve is tricuspid. There is moderate calcification of the aortic valve. There is moderate thickening of the aortic valve. Aortic valve regurgitation is mild to moderate. No aortic stenosis is present.  8. The inferior vena cava is dilated in size with >50% respiratory variability, suggesting right atrial pressure of 8 mmHg. FINDINGS  Left Ventricle: Left ventricular ejection fraction, by estimation, is 35 to 40%. The left ventricle has moderately decreased function. The left ventricle demonstrates global hypokinesis. The left ventricular internal cavity size was normal in size. There is no left ventricular hypertrophy. Left ventricular diastolic parameters are consistent with Grade III diastolic dysfunction (restrictive). Elevated left atrial pressure. Right Ventricle: The right ventricular size is normal. No increase in right ventricular wall thickness. Right ventricular systolic function is normal. There is severely elevated pulmonary artery systolic pressure. The tricuspid regurgitant velocity is 3.84 m/s, and with an assumed right atrial pressure of 8 mmHg, the estimated right ventricular systolic pressure is 44.3 mmHg. Left Atrium: Left atrial size was moderately dilated. Right Atrium: Right atrial size was moderately dilated. Pericardium: There is no evidence of pericardial effusion.  Mitral Valve: The mitral valve is abnormal. There is moderate thickening of the mitral valve leaflet(s). There is moderate calcification of the mitral valve leaflet(s). Moderate mitral annular calcification. Mild to moderate mitral valve regurgitation. No evidence of mitral valve stenosis. Tricuspid Valve: There is systolic flow reversal of hepatic veins consistent with severe TR. The tricuspid valve is abnormal. Tricuspid valve regurgitation is severe. No evidence of tricuspid stenosis. Aortic Valve: The aortic valve is tricuspid. There is moderate calcification of the aortic valve. There is moderate thickening of the aortic valve. There is moderate aortic valve annular calcification. Aortic valve regurgitation is mild to moderate. Aortic regurgitation PHT measures 340 msec. No aortic stenosis is present. Pulmonic Valve: The pulmonic valve was not well visualized. Pulmonic valve regurgitation is mild. No evidence of pulmonic stenosis. Aorta: The aortic root is normal in size and structure. Venous: The inferior vena cava is dilated in size with greater than 50% respiratory variability, suggesting right atrial pressure of 8 mmHg. IAS/Shunts: No atrial level shunt detected by color flow Doppler.  LEFT VENTRICLE PLAX 2D LVIDd:         4.50 cm     Diastology LVIDs:         3.80 cm     LV e' medial:    4.24 cm/s LV PW:         0.90 cm     LV E/e' medial:  22.7 LV IVS:        0.80 cm     LV e' lateral:   7.18 cm/s LVOT diam:     1.80 cm     LV E/e' lateral: 13.4 LV SV:         33 LV SV Index:   21 LVOT Area:     2.54 cm  LV Volumes (MOD) LV vol d, MOD A2C: 55.6 ml  LV vol s, MOD A2C: 42.4 ml LV SV MOD A2C:     13.2 ml RIGHT VENTRICLE            IVC RV S prime:     8.38 cm/s  IVC diam: 1.90 cm TAPSE (M-mode): 1.1 cm LEFT ATRIUM             Index       RIGHT ATRIUM           Index LA diam:        4.00 cm 2.55 cm/m  RA Area:     21.80 cm LA Vol (A2C):   70.9 ml 45.18 ml/m RA Volume:   63.30 ml  40.34 ml/m LA Vol (A4C):    70.2 ml 44.73 ml/m LA Biplane Vol: 71.9 ml 45.82 ml/m  AORTIC VALVE LVOT Vmax:   90.80 cm/s LVOT Vmean:  56.100 cm/s LVOT VTI:    0.128 m AI PHT:      340 msec  AORTA Ao Root diam: 2.60 cm Ao Asc diam:  3.10 cm MITRAL VALVE                 TRICUSPID VALVE MV Area (PHT): 5.02 cm      TR Peak grad:   59.0 mmHg MV Decel Time: 151 msec      TR Vmax:        384.00 cm/s MR Peak grad:    118.8 mmHg MR Mean grad:    74.0 mmHg   SHUNTS MR Vmax:         545.00 cm/s Systemic VTI:  0.13 m MR Vmean:        408.0 cm/s  Systemic Diam: 1.80 cm MR PISA:         1.01 cm MR PISA Eff ROA: 7 mm MR PISA Radius:  0.40 cm MV E velocity: 96.40 cm/s MV A velocity: 41.10 cm/s MV E/A ratio:  2.35 Carlyle Dolly MD Electronically signed by Carlyle Dolly MD Signature Date/Time: 07/18/2021/5:34:47 PM    Final     Medications:    allopurinol  100 mg Oral Daily   amiodarone  200 mg Oral Daily   apixaban  2.5 mg Oral BID   atorvastatin  20 mg Oral QPM   [START ON 07/21/2021] colchicine  0.3 mg Oral Daily   folic acid  1 mg Oral q AM   furosemide  40 mg Intravenous BID   lidocaine  3 patch Transdermal Q24H   losartan  25 mg Oral Daily   magnesium oxide  400 mg Oral Daily   metoprolol succinate  100 mg Oral Daily   pantoprazole  40 mg Oral BID   potassium chloride  20 mEq Oral QODAY   sodium chloride flush  3 mL Intravenous Q12H   spironolactone  25 mg Oral Daily   thiamine  100 mg Oral Daily   cyanocobalamin  1,000 mcg Oral Daily   Continuous Infusions:  sodium chloride       LOS: 3 days   Geradine Girt  [facility]   How to contact the [facility] Attending or Consulting provider 7A - 7P or covering provider during after hours 7P -7A, for this patient?  Check the care team in [facility] and look for a) attending/consulting TRH provider listed and b) the [facility] team listed Log into [email or web address] and use [place]'s universal password to access. If you do not have the password, please contact the hospital  operator. Locate the [facility] provider you  are looking for under [facility] and page to a number that you can be directly reached. If you still have difficulty reaching the provider, please page the [facility] (Director on Call) for the Hospitalists listed on amion for assistance.  07/20/2021, 2:17 PM

## 2021-07-20 NOTE — Progress Notes (Signed)
Pt may need xray on her R shoulder, fell before admission on it, and she is in a lot pain from this, thanks Buckner Malta.

## 2021-07-20 NOTE — Progress Notes (Signed)
Heart Failure Nurse Navigator Progress Note  Attempted to perform HV TOC screening. Pt sleeping, requested to be seen at a later time. Will attempt later today as time allows. Will assess for HV TOC readiness for post discharge follow up.  Consulted pharmacy team regarding optimization of medications while inpatient. -Appreciated.   Pricilla Holm, MSN, RN Heart Failure Nurse Navigator [phone]

## 2021-07-20 NOTE — Plan of Care (Signed)
  Problem: Pain Managment: Goal: General experience of comfort will improve Outcome: Completed/Met

## 2021-07-21 ENCOUNTER — Encounter (HOSPITAL_COMMUNITY): Payer: Self-pay | Admitting: Internal Medicine

## 2021-07-21 DIAGNOSIS — R778 Other specified abnormalities of plasma proteins: Secondary | ICD-10-CM

## 2021-07-21 DIAGNOSIS — R0602 Shortness of breath: Secondary | ICD-10-CM

## 2021-07-21 DIAGNOSIS — E8770 Fluid overload, unspecified: Secondary | ICD-10-CM | POA: Diagnosis not present

## 2021-07-21 LAB — HCV RNA QUANT
HCV Quantitative Log: 5.895 log10 IU/mL (ref >1.70)
HCV Quantitative: 785000 IU/mL (ref >50)

## 2021-07-21 LAB — BASIC METABOLIC PANEL
Anion gap: 8 (ref 5–15)
BUN: 38 mg/dL — ABNORMAL HIGH (ref 8–23)
CO2: 23 mmol/L (ref 22–32)
Calcium: 8.6 mg/dL — ABNORMAL LOW (ref 8.9–10.3)
Chloride: 102 mmol/L (ref 98–111)
Creatinine, Ser: 1.59 mg/dL — ABNORMAL HIGH (ref 0.44–1.00)
GFR, Estimated: 32 mL/min — ABNORMAL LOW (ref >60)
Glucose, Bld: 93 mg/dL (ref 70–99)
Potassium: 3.3 mmol/L — ABNORMAL LOW (ref 3.5–5.1)
Sodium: 133 mmol/L — ABNORMAL LOW (ref 135–145)

## 2021-07-21 MED ORDER — FOLIC ACID 1 MG PO TABS
1.0000 mg | ORAL_TABLET | Freq: Every morning | ORAL | Status: DC
  Administered 2021-07-21 – 2021-07-22 (×2): 1 mg via ORAL
  Filled 2021-07-21: qty 1

## 2021-07-21 NOTE — Progress Notes (Addendum)
Heart Failure Nurse Navigator Progress Note  PCP: Seward Carol, MD PCP-Cardiologist: Levada Dy., MD Admission Diagnosis: A/C CHF Admitted from: [facility].  Presentation:   [name] presented 9/23 with increased SOB, swelling and abd pain. Dtr at bedside, dtr interactive with interview, pt did not speak much or interact. Pt states her R shoulder area hurts, utilizing use of her left hand to lift her R arm.  Pt lives in IDL--has assisted living at same facility if needed. Dtr able to get pt to and from appts. PT had eval Sunday, pt appears very frail, monitoring for improved functional status to determine benefits from HV TOC. Placed HV TOC appt tentatively for 10/4, if renal function and physical status does not improve will cancel, daughter aware.    ECHO/ LVEF: 06/2021 35-40%, severe TVR, mild/mod MVR + AVR, severe pulm HTN.  01/2021 55% 11/2020 25-30%, believed to be tachymediated d/t AF RVR.   Clinical Course:  Past Medical History:  Diagnosis Date   Atrial fibrillation (HCC)    Chronic diastolic (congestive) heart failure (HCC)    Diverticulitis    High cholesterol    Hypertension      Social History   Socioeconomic History   Marital status: Single    Spouse name: Not on file   Number of children: 1   Years of education: Not on file   Highest education level: High school graduate  Occupational History   Occupation: retired  Tobacco Use   Smoking status: Never   Smokeless tobacco: Never  Vaping Use   Vaping Use: Never used  Substance and Sexual Activity   Alcohol use: Not Currently   Drug use: No   Sexual activity: Not on file  Other Topics Concern   Not on file  Social History Narrative   Not on file   Social Determinants of Health   Financial Resource Strain: Low Risk    Difficulty of Paying Living Expenses: Not very hard  Food Insecurity: No Food Insecurity   Worried About Charity fundraiser in the Last Year: Never true    Ran Out of Food in the Last Year: Never true  Transportation Needs: No Transportation Needs   Lack of Transportation (Medical): No   Lack of Transportation (Non-Medical): No  Physical Activity: Not on file  Stress: Not on file  Social Connections: Not on file    High Risk Criteria for Readmission and/or Poor Patient Outcomes: Heart failure hospital admissions (last 6 months): 2  No Show rate: 0% Difficult social situation: no Demonstrates medication adherence: yes Primary Language: English Literacy level: able to read/write and comprehend.   Barriers of Care:   -renal function -functional status  Considerations/Referrals:   Referral made to Heart Failure Pharmacist Stewardship: yes, appreciated Referral made to Heart Failure CSW/NCM TOC: no Referral made to Heart & Vascular TOC clinic: yes, tentative 10/4 @ 9am. Has gen cards appt for 10/13 with Dr. Gardiner Rhyme.  Items for Follow-up on DC/TOC: -optimize -HF education   [name], MSN, RN Heart Failure Nurse Navigator [phone]

## 2021-07-21 NOTE — Progress Notes (Signed)
Progress Note    Maria Travis  NFA:[id number] DOB: 1940/12/28  DOA: 07/17/2021 PCP: Seward Carol, MD    Brief Narrative:    Medical records reviewed and are as summarized below:  Maria Travis is an 80 y.o. female increasing SOB and abdominal pain for several days. She has had distention of her abdomen and her colostomy bag. Increased swelling of the LE is also reported. Dr. Nechama Guard, cardiology, who saw the patient 07/01/21 was called and referred the patient to [facility].  Overall seems to be weak.  Cr has increased with IV Lasix.  Will get [place] conversation started with palliative care.   Assessment/Plan:   Active Problems:   Paroxysmal atrial fibrillation (HCC)   Hypertension   Acute on chronic combined systolic and diastolic CHF (congestive heart failure) (HCC)   Anasarca   Hepatic steatosis   Abdominal ascites   Acute on chronic combined systolic (congestive) and diastolic (congestive) heart failure (HCC)   Left-sided chest wall pain   Abdominal distension   Severe protein-calorie malnutrition (HCC)   Hepatitis C antibody positive in blood   Acute combined systolic and diastolic heart failure (HCC)      Volume overload from Acute on chronic combined heart failure less likely cirrhosis   - CXR w/o pulmonary edema. Troponin mildly elevated x 2. No EKG. Cardiology has seen patient - Lasix 40 mg IV q 12- on 9/27 has increased Cr -down 3.7L if I/Os are correct  -paracentesis-- U/S shows NOT enough ascites to safely do -cards:concern with weakness and increased CR after IV lasix -echo: Left ventricular ejection fraction, by estimation, is 35 to 40%. The  left ventricle has moderately decreased function. The left ventricle  demonstrates global hypokinesis. Left ventricular diastolic parameters are  consistent with Grade III diastolic  dysfunction (restrictive). -palliative care  Left-sided chest wall pain.  -no obvious fx -lidocaine patch   Shoulder  pain -lidocaine patch -x ray w/o fracture  PAF - in stable rhythm on exam -eliquis   HTN -monitor while diuresing  -adjust meds as able   CKD stage IIIB -monitor with diuresis  Hypokelamia -replete  Hypomagnesemia -replete    Family Communication/Anticipated D/C date and plan/Code Status   DVT prophylaxis: eliquis Code Status: DNR Disposition Plan: Status is: Inpatient Spoke with daughter on phone Remains inpatient appropriate because:Inpatient level of care appropriate due to severity of illness  Dispo: The patient is from: Home              Anticipated d/c is to: Home              Patient currently is not medically stable to d/c.   Difficult to place patient No         Medical Consultants:   GI Cards Palliative care  Subjective:   Feels weak Should pain slightly better with lidocaine Lived in [place] until moving to [place] with daughter- this is her 4th hospitalization this year  Objective:    Vitals:   07/19/21 2046 07/20/21 0312 07/20/21 1956 07/21/21 0401  BP: 133/73 123/74 125/74 115/69  Pulse: 79 71 72 62  Resp: 16 16 18 20   Temp: 98.5 F (36.9 C) 98.2 F (36.8 C) 98.5 F (36.9 C) 97.9 F (36.6 C)  TempSrc: Oral Oral Oral Oral  SpO2: 99% 96% 100% 100%  Weight:  52.2 kg  53.4 kg  Height:        Intake/Output Summary (Last 24 hours) at 07/21/2021 1030 Last data filed at 07/21/2021  9326 Gross per 24 hour  Intake 640 ml  Output 500 ml  Net 140 ml   Filed Weights   07/19/21 0422 07/20/21 0312 07/21/21 0401  Weight: 52.6 kg 52.2 kg 53.4 kg    Exam:  General: Appearance:    Elderly female in no acute distress     Lungs:     Diminished, respirations unlabored  Heart:    Normal heart rate.   MS:   All extremities are intact.    Neurologic:   Awake, alert, oriented x 3       Data Reviewed:   I have personally reviewed following labs and imaging studies:  Labs: Labs show the following:   Basic Metabolic  Panel: Recent Labs  Lab 07/17/21 1454 07/18/21 0341 07/19/21 0319 07/20/21 0225 07/21/21 0452  NA 142 140 138 134* 133*  K 4.3 3.6 3.2* 3.7 3.3*  CL 112* 110 104 103 102  CO2 21* 20* 20* 21* 23  GLUCOSE 86 76 105* 114* 93  BUN 21 19 22  29* 38*  CREATININE 1.30* 1.25* 1.30* 1.36* 1.59*  CALCIUM 9.0 9.3 9.0 8.7* 8.6*  MG 1.5*  --   --  1.3*  --    GFR Estimated Creatinine Clearance: 23 mL/min (A) (by C-G formula based on SCr of 1.59 mg/dL (H)). Liver Function Tests: Recent Labs  Lab 07/17/21 1454  AST 34  ALT 16  ALKPHOS 86  BILITOT 1.3*  PROT 6.0*  ALBUMIN 3.3*   Recent Labs  Lab 07/17/21 1454  LIPASE 39   No results for input(s): AMMONIA in the last 168 hours. Coagulation profile Recent Labs  Lab 07/17/21 1454 07/19/21 0319  INR 1.5* 1.8*    CBC: Recent Labs  Lab 07/17/21 1454 07/19/21 0319 07/20/21 0225  WBC 10.9* 12.4* 12.7*  NEUTROABS 9.1*  --   --   HGB 10.2* 11.1* 11.3*  HCT 32.1* 33.9* 34.0*  MCV 94.4 89.9 89.2  PLT 97* 124* 133*   Cardiac Enzymes: No results for input(s): CKTOTAL, CKMB, CKMBINDEX, TROPONINI in the last 168 hours. BNP (last 3 results) No results for input(s): PROBNP in the last 8760 hours. CBG: No results for input(s): GLUCAP in the last 168 hours. D-Dimer: No results for input(s): DDIMER in the last 72 hours. Hgb A1c: No results for input(s): HGBA1C in the last 72 hours. Lipid Profile: No results for input(s): CHOL, HDL, LDLCALC, TRIG, CHOLHDL, LDLDIRECT in the last 72 hours. Thyroid function studies: No results for input(s): TSH, T4TOTAL, T3FREE, THYROIDAB in the last 72 hours.  Invalid input(s): FREET3  Anemia work up: No results for input(s): VITAMINB12, FOLATE, FERRITIN, TIBC, IRON, RETICCTPCT in the last 72 hours.  Sepsis Labs: Recent Labs  Lab 07/17/21 1454 07/19/21 0319 07/20/21 0225  WBC 10.9* 12.4* 12.7*    Microbiology Recent Results (from the past 240 hour(s))  Resp Panel by RT-PCR (Flu A&B,  Covid) Nasopharyngeal Swab     Status: None   Collection Time: 07/17/21  2:51 PM   Specimen: Nasopharyngeal Swab; Nasopharyngeal(NP) swabs in vial transport medium  Result Value Ref Range Status   SARS Coronavirus 2 by RT PCR NEGATIVE NEGATIVE Final    Comment: (NOTE) SARS-CoV-2 target nucleic acids are NOT DETECTED.  The SARS-CoV-2 RNA is generally detectable in upper respiratory specimens during the acute phase of infection. The lowest concentration of SARS-CoV-2 viral copies this assay can detect is 138 copies/mL. A negative result does not preclude SARS-Cov-2 infection and should not be used as the sole  basis for treatment or other patient management decisions. A negative result may occur with  improper specimen collection/handling, submission of specimen other than nasopharyngeal swab, presence of viral mutation(s) within the areas targeted by this assay, and inadequate number of viral copies(<138 copies/mL). A negative result must be combined with clinical observations, patient history, and epidemiological information. The expected result is Negative.  Fact Sheet for Patients:  EntrepreneurPulse.com.au  Fact Sheet for Healthcare Providers:  IncredibleEmployment.be  This test is no t yet approved or cleared by the [place] FDA and  has been authorized for detection and/or diagnosis of SARS-CoV-2 by FDA under an Emergency Use Authorization (EUA). This EUA will remain  in effect (meaning this test can be used) for the duration of the COVID-19 declaration under Section 564(b)(1) of the Act, 21 U.S.C.section 360bbb-3(b)(1), unless the authorization is terminated  or revoked sooner.       Influenza A by PCR NEGATIVE NEGATIVE Final   Influenza B by PCR NEGATIVE NEGATIVE Final    Comment: (NOTE) The Xpert Xpress SARS-CoV-2/FLU/RSV plus assay is intended as an aid in the diagnosis of influenza from Nasopharyngeal swab specimens and should  not be used as a sole basis for treatment. Nasal washings and aspirates are unacceptable for Xpert Xpress SARS-CoV-2/FLU/RSV testing.  Fact Sheet for Patients: EntrepreneurPulse.com.au  Fact Sheet for Healthcare Providers: IncredibleEmployment.be  This test is not yet approved or cleared by the [place] FDA and has been authorized for detection and/or diagnosis of SARS-CoV-2 by FDA under an Emergency Use Authorization (EUA). This EUA will remain in effect (meaning this test can be used) for the duration of the COVID-19 declaration under Section 564(b)(1) of the Act, 21 U.S.C. section 360bbb-3(b)(1), unless the authorization is terminated or revoked.  Performed at [facility], [place] [address] [place], [place] [address]     Procedures and diagnostic studies:  DG Shoulder Right  Result Date: 07/20/2021 CLINICAL DATA:  RIGHT shoulder pain post fall, pain radiating from shoulder to elbow EXAM: RIGHT SHOULDER - 2+ VIEW COMPARISON:  None FINDINGS: Osseous demineralization. AC joint alignment normal. Visualized RIGHT ribs intact. Minimal calcification adjacent to the humeral head, may represent rotator cuff calcification or minimal chondrocalcinosis. No definite fracture, dislocation, or bone destruction. IMPRESSION: No acute osseous abnormalities. Minimal calcification adjacent to humeral head question rotator cuff calcific tendinitis versus minimal chondrocalcinosis. Electronically Signed   By: Lavonia Dana M.D.   On: 07/20/2021 10:54    Medications:    allopurinol  100 mg Oral Daily   amiodarone  200 mg Oral Daily   apixaban  2.5 mg Oral BID   atorvastatin  20 mg Oral QPM   colchicine  0.3 mg Oral Daily   folic acid  1 mg Oral q AM   furosemide  40 mg Intravenous BID   lidocaine  3 patch Transdermal Q24H   losartan  25 mg Oral Daily   magnesium oxide  400 mg Oral Daily   metoprolol succinate  100 mg Oral Daily   pantoprazole  40 mg  Oral BID   potassium chloride  20 mEq Oral QODAY   sodium chloride flush  3 mL Intravenous Q12H   spironolactone  25 mg Oral Daily   thiamine  100 mg Oral Daily   cyanocobalamin  1,000 mcg Oral Daily   Continuous Infusions:  sodium chloride       LOS: 4 days   Geradine Girt  [facility]   How to contact the [facility] Attending  or Consulting provider [place] or covering provider during after hours [place], for this patient?  Check the care team in [facility] and look for a) attending/consulting [facility] provider listed and b) the [facility] team listed Log into [email or web address] and use [place]'s universal password to access. If you do not have the password, please contact the hospital operator. Locate the [facility] provider you are looking for under [facility] and page to a number that you can be directly reached. If you still have difficulty reaching the provider, please page the [facility] (Director on Call) for the Hospitalists listed on amion for assistance.  07/21/2021, 10:30 AM

## 2021-07-21 NOTE — Plan of Care (Signed)
  Problem: Education: Goal: Knowledge of General Education information will improve Description: Including pain rating scale, medication(s)/side effects and non-pharmacologic comfort measures Outcome: Progressing   Problem: Activity: Goal: Risk for activity intolerance will decrease Outcome: Progressing   Problem: Nutrition: Goal: Adequate nutrition will be maintained Outcome: Progressing   

## 2021-07-21 NOTE — Care Management Important Message (Signed)
Important Message  Patient Details  Name: Maria Travis MRN: [id number] Date of Birth: 07-06-1941   Medicare Important Message Given:  Yes     Shelda Altes 07/21/2021, 9:42 AM

## 2021-07-22 DIAGNOSIS — Z7189 Other specified counseling: Secondary | ICD-10-CM | POA: Diagnosis not present

## 2021-07-22 DIAGNOSIS — B182 Chronic viral hepatitis C: Secondary | ICD-10-CM

## 2021-07-22 DIAGNOSIS — Z515 Encounter for palliative care: Secondary | ICD-10-CM | POA: Diagnosis not present

## 2021-07-22 DIAGNOSIS — I071 Rheumatic tricuspid insufficiency: Secondary | ICD-10-CM | POA: Diagnosis not present

## 2021-07-22 DIAGNOSIS — I272 Pulmonary hypertension, unspecified: Secondary | ICD-10-CM

## 2021-07-22 DIAGNOSIS — Z66 Do not resuscitate: Secondary | ICD-10-CM

## 2021-07-22 DIAGNOSIS — E871 Hypo-osmolality and hyponatremia: Secondary | ICD-10-CM

## 2021-07-22 DIAGNOSIS — I50814 Right heart failure due to left heart failure: Secondary | ICD-10-CM | POA: Diagnosis not present

## 2021-07-22 LAB — ALBUMIN: Albumin: 2.7 g/dL — ABNORMAL LOW (ref 3.5–5.0)

## 2021-07-22 LAB — BASIC METABOLIC PANEL
Anion gap: 9 (ref 5–15)
BUN: 37 mg/dL — ABNORMAL HIGH (ref 8–23)
CO2: 23 mmol/L (ref 22–32)
Calcium: 8.7 mg/dL — ABNORMAL LOW (ref 8.9–10.3)
Chloride: 102 mmol/L (ref 98–111)
Creatinine, Ser: 1.53 mg/dL — ABNORMAL HIGH (ref 0.44–1.00)
GFR, Estimated: 34 mL/min — ABNORMAL LOW (ref >60)
Glucose, Bld: 98 mg/dL (ref 70–99)
Potassium: 3.6 mmol/L (ref 3.5–5.1)
Sodium: 134 mmol/L — ABNORMAL LOW (ref 135–145)

## 2021-07-22 LAB — MAGNESIUM: Magnesium: 2.3 mg/dL (ref 1.7–2.4)

## 2021-07-22 MED ORDER — GLYCOPYRROLATE 0.2 MG/ML IJ SOLN: 0.2000 mg | INTRAMUSCULAR | Status: DC | PRN

## 2021-07-22 MED ORDER — LORAZEPAM 2 MG/ML PO CONC: 1.0000 mg | ORAL | Status: DC | PRN

## 2021-07-22 MED ORDER — GLYCOPYRROLATE 1 MG PO TABS
1.0000 mg | ORAL_TABLET | ORAL | Status: DC | PRN
  Filled 2021-07-22: qty 1

## 2021-07-22 MED ORDER — HALOPERIDOL 0.5 MG PO TABS
0.5000 mg | ORAL_TABLET | ORAL | Status: DC | PRN
  Filled 2021-07-22: qty 1

## 2021-07-22 MED ORDER — HALOPERIDOL LACTATE 2 MG/ML PO CONC
0.5000 mg | ORAL | Status: DC | PRN
  Filled 2021-07-22: qty 0.3

## 2021-07-22 MED ORDER — ONDANSETRON HCL 4 MG/2ML IJ SOLN: 4.0000 mg | Freq: Four times a day (QID) | INTRAMUSCULAR | Status: DC | PRN

## 2021-07-22 MED ORDER — HALOPERIDOL LACTATE 5 MG/ML IJ SOLN: 0.5000 mg | INTRAMUSCULAR | Status: DC | PRN

## 2021-07-22 MED ORDER — HYDROMORPHONE HCL 1 MG/ML IJ SOLN: 0.5000 mg | INTRAMUSCULAR | Status: DC | PRN

## 2021-07-22 MED ORDER — LORAZEPAM 1 MG PO TABS: 1.0000 mg | ORAL_TABLET | ORAL | Status: DC | PRN

## 2021-07-22 MED ORDER — LORAZEPAM 2 MG/ML IJ SOLN: 1.0000 mg | INTRAMUSCULAR | Status: DC | PRN

## 2021-07-22 MED ORDER — ONDANSETRON 4 MG PO TBDP
4.0000 mg | ORAL_TABLET | Freq: Four times a day (QID) | ORAL | Status: DC | PRN
  Filled 2021-07-22: qty 1

## 2021-07-22 MED ORDER — POLYVINYL ALCOHOL 1.4 % OP SOLN
1.0000 [drp] | Freq: Four times a day (QID) | OPHTHALMIC | Status: DC | PRN
  Filled 2021-07-22: qty 15

## 2021-07-22 NOTE — Progress Notes (Signed)
Manufacturing engineer [facility]) Hospital Liaison note.    Received request from [place] for family interest in [facility]. Spoke with family to confirm interest and answer questions. [place] is unable to offer a room today. Hospital Liaison will follow up tomorrow or sooner if a room becomes available and eligibility is confirmed.   Please do not hesitate to call with questions.    Thank you,   Farrel Gordon, RN, [facility] Liaison   [phone]

## 2021-07-22 NOTE — Progress Notes (Signed)
PROGRESS NOTE  Maria Travis WUJ:[id number] DOB: 08-09-1941   PCP: Seward Carol, MD  Patient is from: Independent living facility  DOA: 07/17/2021 LOS: 5  Chief complaints:  Chief Complaint  Patient presents with   Weakness     Brief Narrative / Interim history: 80 year old F with PMH of combined CHF, pulmonary hypertension, PAF s/p DCCV in 11/2020, CKD-3B, colostomy and HTN presenting with abdominal pain and edema, and admitted for acute on chronic combined CHF.  Cardiology was consulted.  She was started on IV diuretics.   Unfortunately, she declined physically and mentally despite treatments.  Renal function also gotten worse which limited further use of diuretics.  This was discussed with patient's daughter and son-in-law at the bedside on 07/22/2021.  We also discussed treatment options including current scope of care which did not seem to help patient much or emphasis on patient's comfort dignity and quality of life for peaceful transition and end-of-life care.  Patient's family felt emphasis on comfort and dignity is appropriate and to the patient's best interest at this point.  They are also interested with residential hospice for further end-of-life care.  I have discussed the details of comfort measures including use of medications to elevate pain, distress, air hunger, anxiety.... which could potentially hasten the dying process but makes it more peaceful.  Patient's family appreciated the care and time.  End-of-life care initiated.  TOC consulted for referral to residential hospice.  Subjective: Seen and examined earlier this morning.  Patient is lying in bed.  Somewhat awake.  She is oriented to self and place.  Seems to be pointing at her right shoulder when I asked about pain.  Very low tone speech to hear.  Very frail and weak but does not appear to be in distress.  Objective: Vitals:   07/21/21 1135 07/22/21 0524 07/22/21 0953 07/22/21 1153  BP: 120/76 137/79 139/71    Pulse: 71 82 79   Resp: 16 18 16    Temp: 97.6 F (36.4 C) 98.5 F (36.9 C) (!) 101.2 F (38.4 C) (!) 101 F (38.3 C)  TempSrc: Oral  Oral Oral  SpO2: 100% 98% 98%   Weight:  52.8 kg    Height:        Intake/Output Summary (Last 24 hours) at 07/22/2021 1425 Last data filed at 07/22/2021 1258 Gross per 24 hour  Intake 240 ml  Output 450 ml  Net -210 ml   Filed Weights   07/20/21 0312 07/21/21 0401 07/22/21 0524  Weight: 52.2 kg 53.4 kg 52.8 kg    Examination:  GENERAL: Very frail and chronically ill-appearing. HEENT: MMM.  Vision and hearing grossly intact.  NECK: Supple.  No apparent JVD.  RESP: On RA.  No IWOB.  Diminished aeration bilaterally mainly due to poor inspiratory effort CVS:  RRR. Heart sounds normal.  ABD/GI/GU: BS+. Abd soft, NTND.  Colostomy with normal-looking stool MSK/EXT: Very weak.  Significant muscle mass and subcu fat loss. SKIN: no apparent skin lesion or wound NEURO: Awake but not quite alert.  Oriented to self and place.  Follows some commands.  No apparent focal neurodeficit. PSYCH: Calm.  No distress or agitation.  Procedures:  None  Microbiology summarized: NFAOZ-30 and influenza PCR nonreactive.  Assessment & Plan: Anasarca due to acute on chronic combined CHF/severe pulmonary hypertension/severe tricuspid valve regurgitation: Patient presented with abdominal pain and edema likely from biventricular heart failure..  CXR with pulmonary edema.  BNP markedly elevated as below.  TTE with LVEF of 35 to  40%, GH, G3-DD, and severe TVR RVSP of 67 mmHg.  Diuresed with IV Lasix but continued to decline physically mentally.  Not able to diurese further safely due to worsening renal function.  Very poor prognosis. -Changed to comfort care after discussion with patient's daughter and son-in-law BNP (last 3 results) Recent Labs    07/17/21 1444 07/17/21 2052 07/20/21 0225  BNP 2,777.9* 4,004.4* 1,978.3*   AKI on CKD-3A/azotemia-this is likely due  to diuretics and cardiorenal syndrome. Recent Labs    05/27/21 0943 06/11/21 1238 07/01/21 1149 07/10/21 0912 07/17/21 1454 07/18/21 0341 07/19/21 0319 07/20/21 0225 07/21/21 0452 07/22/21 0321  BUN 29* 27 25 22 21 19 22  29* 38* 37*  CREATININE 1.74* 1.02* 1.45* 1.35* 1.30* 1.25* 1.30* 1.36* 1.59* 1.53*   Left-sided chest wall pain: Could be from CHF or musculoskeletal.  Unlikely ACS.  Has elevated troponin but no significant delta.  Hepatitis C infection: Hep C antibody reactive.  Quantitative HCV 785,000  Right shoulder pain-likely osteoarthritis.   Paroxysmal A. fib: Rate controlled.   Essential hypertension  Hyponatremia/hypokalemia/hypomagnesemia   Physical deconditioning  End-of-life care/comfort measures only/DNR/DNI -See goal of care discussion above -Symptomatic care with as needed IV Dilaudid, Ativan, glycopyrrolate, Zofran, frequent turning... -Plan for transfer to residential hospice  Severe malnutrition/FTT: As evidenced by poor p.o. intake, low BMI and significant muscle mass and subcu fat loss Body mass index is 20.62 kg/m.         DVT prophylaxis:    Patient is comfort measures only.  Code Status: DNR/DNI Family Communication: Updated patient's daughter and son-in-law at the bedside. Level of care: Med-Surg Status is: Inpatient  Remains inpatient appropriate because:Unsafe d/c plan  Dispo: The patient is from:  [place]              Anticipated d/c is to:  Residential hospice              Patient currently is medically stable to d/c.   Difficult to place patient No       Consultants:  Cardiology Gastroenterology Palliative medicine   Sch Meds:  Scheduled Meds: Continuous Infusions: PRN Meds:.glycopyrrolate **OR** glycopyrrolate **OR** glycopyrrolate, haloperidol **OR** haloperidol **OR** haloperidol lactate, HYDROmorphone (DILAUDID) injection, LORazepam **OR** [DISCONTINUED] LORazepam **OR** LORazepam, ondansetron **OR** ondansetron  (ZOFRAN) IV, polyvinyl alcohol  Antimicrobials: Anti-infectives (From admission, onward)    None        I have personally reviewed the following labs and images: CBC: Recent Labs  Lab 07/17/21 1454 07/19/21 0319 07/20/21 0225  WBC 10.9* 12.4* 12.7*  NEUTROABS 9.1*  --   --   HGB 10.2* 11.1* 11.3*  HCT 32.1* 33.9* 34.0*  MCV 94.4 89.9 89.2  PLT 97* 124* 133*   BMP &GFR Recent Labs  Lab 07/17/21 1454 07/18/21 0341 07/19/21 0319 07/20/21 0225 07/21/21 0452 07/22/21 0321  NA 142 140 138 134* 133* 134*  K 4.3 3.6 3.2* 3.7 3.3* 3.6  CL 112* 110 104 103 102 102  CO2 21* 20* 20* 21* 23 23  GLUCOSE 86 76 105* 114* 93 98  BUN 21 19 22  29* 38* 37*  CREATININE 1.30* 1.25* 1.30* 1.36* 1.59* 1.53*  CALCIUM 9.0 9.3 9.0 8.7* 8.6* 8.7*  MG 1.5*  --   --  1.3*  --  2.3   Estimated Creatinine Clearance: 23.5 mL/min (A) (by C-G formula based on SCr of 1.53 mg/dL (H)). Liver & Pancreas: Recent Labs  Lab 07/17/21 1454 07/22/21 0321  AST 34  --   ALT  16  --   ALKPHOS 86  --   BILITOT 1.3*  --   PROT 6.0*  --   ALBUMIN 3.3* 2.7*   Recent Labs  Lab 07/17/21 1454  LIPASE 39   No results for input(s): AMMONIA in the last 168 hours. Diabetic: No results for input(s): HGBA1C in the last 72 hours. No results for input(s): GLUCAP in the last 168 hours. Cardiac Enzymes: No results for input(s): CKTOTAL, CKMB, CKMBINDEX, TROPONINI in the last 168 hours. No results for input(s): PROBNP in the last 8760 hours. Coagulation Profile: Recent Labs  Lab 07/17/21 1454 07/19/21 0319  INR 1.5* 1.8*   Thyroid Function Tests: No results for input(s): TSH, T4TOTAL, FREET4, T3FREE, THYROIDAB in the last 72 hours. Lipid Profile: No results for input(s): CHOL, HDL, LDLCALC, TRIG, CHOLHDL, LDLDIRECT in the last 72 hours. Anemia Panel: No results for input(s): VITAMINB12, FOLATE, FERRITIN, TIBC, IRON, RETICCTPCT in the last 72 hours. Urine analysis:    Component Value Date/Time    COLORURINE STRAW (A) 07/18/2021 0451   APPEARANCEUR CLEAR 07/18/2021 0451   LABSPEC 1.015 07/18/2021 0451   PHURINE 5.0 07/18/2021 0451   GLUCOSEU NEGATIVE 07/18/2021 0451   HGBUR SMALL (A) 07/18/2021 0451   BILIRUBINUR NEGATIVE 07/18/2021 0451   KETONESUR NEGATIVE 07/18/2021 0451   PROTEINUR NEGATIVE 07/18/2021 0451   NITRITE NEGATIVE 07/18/2021 0451   LEUKOCYTESUR NEGATIVE 07/18/2021 0451   Sepsis Labs: Invalid input(s): PROCALCITONIN, [place]  Microbiology: Recent Results (from the past 240 hour(s))  Resp Panel by RT-PCR (Flu A&B, Covid) Nasopharyngeal Swab     Status: None   Collection Time: 07/17/21  2:51 PM   Specimen: Nasopharyngeal Swab; Nasopharyngeal(NP) swabs in vial transport medium  Result Value Ref Range Status   SARS Coronavirus 2 by RT PCR NEGATIVE NEGATIVE Final    Comment: (NOTE) SARS-CoV-2 target nucleic acids are NOT DETECTED.  The SARS-CoV-2 RNA is generally detectable in upper respiratory specimens during the acute phase of infection. The lowest concentration of SARS-CoV-2 viral copies this assay can detect is 138 copies/mL. A negative result does not preclude SARS-Cov-2 infection and should not be used as the sole basis for treatment or other patient management decisions. A negative result may occur with  improper specimen collection/handling, submission of specimen other than nasopharyngeal swab, presence of viral mutation(s) within the areas targeted by this assay, and inadequate number of viral copies(<138 copies/mL). A negative result must be combined with clinical observations, patient history, and epidemiological information. The expected result is Negative.  Fact Sheet for Patients:  EntrepreneurPulse.com.au  Fact Sheet for Healthcare Providers:  IncredibleEmployment.be  This test is no t yet approved or cleared by the [place] FDA and  has been authorized for detection and/or diagnosis of  SARS-CoV-2 by FDA under an Emergency Use Authorization (EUA). This EUA will remain  in effect (meaning this test can be used) for the duration of the COVID-19 declaration under Section 564(b)(1) of the Act, 21 U.S.C.section 360bbb-3(b)(1), unless the authorization is terminated  or revoked sooner.       Influenza A by PCR NEGATIVE NEGATIVE Final   Influenza B by PCR NEGATIVE NEGATIVE Final    Comment: (NOTE) The Xpert Xpress SARS-CoV-2/FLU/RSV plus assay is intended as an aid in the diagnosis of influenza from Nasopharyngeal swab specimens and should not be used as a sole basis for treatment. Nasal washings and aspirates are unacceptable for Xpert Xpress SARS-CoV-2/FLU/RSV testing.  Fact Sheet for Patients: EntrepreneurPulse.com.au  Fact Sheet for Healthcare Providers: IncredibleEmployment.be  This test is not yet approved or cleared by the [place] and has been authorized for detection and/or diagnosis of SARS-CoV-2 by FDA under an Emergency Use Authorization (EUA). This EUA will remain in effect (meaning this test can be used) for the duration of the COVID-19 declaration under Section 564(b)(1) of the Act, 21 U.S.C. section 360bbb-3(b)(1), unless the authorization is terminated or revoked.  Performed at [facility], [place] [address], [place], [place] [address]     Radiology Studies: No results found.   65 minutes with more than 50% spent in reviewing records, counseling patient/family and coordinating care.   Meliana Canner T. [place]  If 7PM-7AM, please contact night-coverage [email or web address] 07/22/2021, 2:25 PM

## 2021-07-22 NOTE — TOC Progression Note (Signed)
Transition of Care [facility]) - Progression Note    Patient Details  Name: Maria Travis MRN: [id number] Date of Birth: [birth year]  Transition [facility]) CM/SW Contact  Zenon Mayo, RN Phone Number: 07/22/2021, 2:07 PM  Clinical Narrative:    Patient is for Residential hospice per Dr. Cyndia Skeeters.  NCM spoke with patient's daughter , [name], she put her husband on the phone, offered choice from Medicare.gov list he say they would like [employer].  NCM made referral to [place] with [facility] for residential hospice.   Expected Discharge Plan: [place] (SNF Vs [facility]) Barriers to Discharge: Continued Medical Work up  Expected Discharge Plan and Services Expected Discharge Plan: [place] (SNF Vs [facility])       Living arrangements for the past 2 months: Single Family Home                                       Social Determinants of Health (SDOH) Interventions Food Insecurity Interventions: Intervention Not Indicated Financial Strain Interventions: Intervention Not Indicated Housing Interventions: Intervention Not Indicated Transportation Interventions: Intervention Not Indicated  Readmission Risk Interventions No flowsheet data found.

## 2021-07-22 NOTE — Progress Notes (Signed)
Paitent has been transitioned to comfort care per nurse and primary service.   Cardiology will sign off.  Elouise Munroe, MD 12:27 PM 07/22/21

## 2021-07-22 NOTE — Plan of Care (Signed)
  Problem: Safety: Goal: Ability to remain free from injury will improve Outcome: Progressing   

## 2021-07-22 NOTE — Plan of Care (Signed)
  Problem: Education: Goal: Knowledge of General Education information will improve Description: Including pain rating scale, medication(s)/side effects and non-pharmacologic comfort measures Outcome: Progressing   Problem: Clinical Measurements: Goal: Ability to maintain clinical measurements within normal limits will improve Outcome: Progressing Goal: Diagnostic test results will improve Outcome: Progressing   

## 2021-07-22 NOTE — Progress Notes (Signed)
   07/22/21 1400  Clinical Encounter Type  Visited With Patient  Visit Type Initial;Social support  Referral From Nurse  Consult/Referral To Chaplain   Chaplain responded to consult. Chaplain attempted to engaged Pt, but Pt appeared sleepy. Chaplain will attempt follow-up tomorrow.  This note was prepared by Chaplain Resident, Dante Gang, MDiv. Chaplain remains available as needed through the on-call pager: [phone].

## 2021-07-22 NOTE — Progress Notes (Signed)
Palliative:  Consult received and chart reviewed. Per RN, patient now transitioning to comfort measures. Reached out to Dr. Cyndia Skeeters - no additional needs for palliative to address.  We will be available if needed, please do not hesitate to reach out.  Juel Burrow, DNP, AGNP-C Palliative Medicine Team Team Phone # [phone]  Pager # [phone]  NO CHARGE

## 2021-07-23 DIAGNOSIS — I5043 Acute on chronic combined systolic (congestive) and diastolic (congestive) heart failure: Secondary | ICD-10-CM | POA: Diagnosis not present

## 2021-07-23 DIAGNOSIS — I5082 Biventricular heart failure: Secondary | ICD-10-CM

## 2021-07-23 DIAGNOSIS — R509 Fever, unspecified: Secondary | ICD-10-CM

## 2021-07-23 DIAGNOSIS — R188 Other ascites: Secondary | ICD-10-CM | POA: Diagnosis not present

## 2021-07-23 DIAGNOSIS — I480 Paroxysmal atrial fibrillation: Secondary | ICD-10-CM

## 2021-07-23 DIAGNOSIS — I071 Rheumatic tricuspid insufficiency: Secondary | ICD-10-CM | POA: Diagnosis not present

## 2021-07-23 MED ORDER — ONDANSETRON 4 MG PO TBDP: 4.0000 mg | ORAL_TABLET | Freq: Three times a day (TID) | ORAL | 0 refills | Status: AC | PRN

## 2021-07-23 MED ORDER — GLYCOPYRROLATE 1 MG/5ML PO SOLN: 1.0000 mg | Freq: Four times a day (QID) | ORAL | 0 refills | Status: AC | PRN

## 2021-07-23 MED ORDER — ACETAMINOPHEN 160 MG/5ML PO ELIX: 640.0000 mg | ORAL_SOLUTION | ORAL | 0 refills | Status: AC | PRN

## 2021-07-23 MED ORDER — LORAZEPAM 0.5 MG PO TABS: 0.5000 mg | ORAL_TABLET | ORAL | 0 refills | Status: AC | PRN

## 2021-07-23 MED ORDER — MORPHINE SULFATE (CONCENTRATE) 10 MG /0.5 ML PO SOLN: 10.0000 mg | ORAL | 0 refills | Status: AC | PRN

## 2021-07-23 NOTE — Progress Notes (Signed)
Heart Failure Nurse Navigator Progress Note  Cancelled HV TOC appt as pt is active with hospice and plans to discharge to [facility].   Pricilla Holm, MSN, RN Heart Failure Nurse Navigator [phone]

## 2021-07-23 NOTE — Progress Notes (Signed)
[facility] ([facility]) Hospital Liaison note.     This patient is approved to transfer to [facility] today.    [facility] will notify TOC when registration paperwork has been completed to arrange transport.    RN please call report to [phone].   Thank you,     Mary Anne Robertson, RN, CCM       [facility] Hospital Liaison  [phone] 

## 2021-07-23 NOTE — TOC Progression Note (Signed)
Transition of Care [facility]) - Progression Note    Patient Details  Name: Maria Travis MRN: [id number] Date of Birth: 1941/06/09  Transition of Care [facility]) CM/SW Contact  Zenon Mayo, RN Phone Number: 07/23/2021, 9:34 AM  Clinical Narrative:    NCM left message for [facility] to return call to see if [facility] will have a bed today.    Expected Discharge Plan: [place] (SNF Vs [facility]) Barriers to Discharge: Continued Medical Work up  Expected Discharge Plan and Services Expected Discharge Plan: [place] (SNF Vs [facility])       Living arrangements for the past 2 months: Single Family Home Expected Discharge Date: 07/23/21                                     Social Determinants of Health (SDOH) Interventions Food Insecurity Interventions: Intervention Not Indicated Financial Strain Interventions: Intervention Not Indicated Housing Interventions: Intervention Not Indicated Transportation Interventions: Intervention Not Indicated  Readmission Risk Interventions No flowsheet data found.

## 2021-07-23 NOTE — Discharge Summary (Signed)
Physician Discharge Summary  [place] UJW:[id number] DOB: [birth year] DOA: 07/17/2021  PCP: Seward Carol, MD  Admit date: 07/17/2021 Discharge date: 07/23/2021 Admitted From: Independent living facility Disposition: Residential hospice at [facility]  Discharge Condition: Poor prognosis. CODE STATUS: DNR/DNI  Follow-up Information     [facility] Follow up.   Specialty: Hospice and Palliative Medicine Why: Residential Hospice Contact information: [place] [phone]               Hospital Course: 80 year old F with PMH of combined CHF, pulmonary hypertension, PAF s/p DCCV in 11/2020, CKD-3B, colostomy and HTN presenting with abdominal pain and edema, and admitted for acute on chronic combined CHF.  Cardiology was consulted.  She was started on IV diuretics.    Unfortunately, she declined physically and mentally despite treatments.  Renal function also gotten worse which limited further use of diuretics.  This was discussed with patient's daughter and son-in-law at the bedside on 07/22/2021.  We also discussed treatment options including current scope of care which did not seem to help patient much or emphasis on patient's comfort dignity and quality of life for peaceful transition and end-of-life care.  Patient's family felt emphasis on comfort and dignity is appropriate and to the patient's best interest at this point.  They are also interested with residential hospice for further end-of-life care.  I have discussed the details of comfort measures including use of medications to elevate pain, distress, air hunger, anxiety.... which could potentially hasten the dying process but makes it more peaceful.  Patient's family appreciated the care and time.  End-of-life care initiated.  TOC consulted for referral to residential hospice. See individual problem list below for more on hospital course.  Discharge Diagnoses:  Anasarca  due to acute on chronic combined CHF/severe pulmonary hypertension/severe tricuspid valve regurgitation: Patient presented with abdominal pain and edema likely from biventricular heart failure..  CXR with pulmonary edema.  BNP markedly elevated as below.  TTE with LVEF of 35 to 40%, GH, G3-DD, and severe TVR RVSP of 67 mmHg.  Diuresed with IV Lasix but continued to decline physically mentally.  Not able to diurese further safely due to worsening renal function.  Very poor prognosis. -Changed to comfort care after discussion with patient's daughter and son-in-law   AKI on CKD-3A/azotemia-this is likely due to diuretics and cardiorenal syndrome.             Recent Labs    05/27/21 0943 06/11/21 1238 07/01/21 1149 07/10/21 0912 07/17/21 1454 07/18/21 0341 07/19/21 0319 07/20/21 0225 07/21/21 0452 07/22/21 0321  BUN 29* 27 25 22 21 19 22  29* 38* 37*  CREATININE 1.74* 1.02* 1.45* 1.35* 1.30* 1.25* 1.30* 1.36* 1.59* 1.53*    Left-sided chest wall pain: Could be from CHF or musculoskeletal.  Unlikely ACS.  Has elevated troponin but no significant delta.   Hepatitis C infection: Hep C antibody reactive.  Quantitative HCV 785,000   Right shoulder pain-likely osteoarthritis.   Paroxysmal A. fib: Rate controlled.   Essential hypertension   Hyponatremia/hypokalemia/hypomagnesemia   Physical deconditioning  Fever: Patient started spiking fever after end-of-life care initiation. -As needed Tylenol   End-of-life care/comfort measures only/DNR/DNI -See goal of care discussion above -Symptomatic care with as needed IV Dilaudid, Ativan, glycopyrrolate, Zofran, frequent turning... -Plan for transfer to residential hospice   Severe malnutrition/FTT: As evidenced by poor p.o. intake, low BMI and significant muscle mass and subcu fat loss Body mass index is 20.62 kg/m.  Discharge Exam: Vitals:   07/22/21 0953 07/22/21 1153 07/23/21 0326 07/23/21 0829  BP: 139/71  (!) 165/87  (!) 166/89  Pulse: 79  79 81  Temp: (!) 101.2 F (38.4 C) (!) 101 F (38.3 C) (!) 100.5 F (38.1 C) (!) 102.1 F (38.9 C)  Resp: 16  16   Height:      Weight:      SpO2: 98%  99% 97%  TempSrc: Oral Oral Oral Oral  BMI (Calculated):         GENERAL: Very frail and chronically ill-appearing. HEENT: MMM.  Vision and hearing grossly intact.  RESP: On RA.  No IWOB.  Diminished aeration bilaterally. CVS:  RRR. Heart sounds normal.  ABD/GI/GU: Bowel sounds present. Soft. Non tender.  MSK/EXT:  Moves extremities.  Significant muscle mass and subcu fat loss. SKIN: no apparent skin lesion or wound NEURO: Awake but not alert.  Oriented to self and place.  Follows some commands.  PSYCH: Calm.  No distress or agitation.  Discharge Instructions   Allergies as of 07/23/2021   No Known Allergies      Medication List     STOP taking these medications    acetaminophen 325 MG tablet Commonly known as: TYLENOL   allopurinol 100 MG tablet Commonly known as: ZYLOPRIM   amiodarone 200 MG tablet Commonly known as: PACERONE   apixaban 2.5 MG Tabs tablet Commonly known as: ELIQUIS   atorvastatin 20 MG tablet Commonly known as: LIPITOR   colchicine 0.6 MG tablet   cyanocobalamin 2297 MCG tablet   folic acid 1 MG tablet Commonly known as: FOLVITE   furosemide 40 MG tablet Commonly known as: LASIX   Loratadine 10 MG Caps   magnesium oxide 400 MG tablet Commonly known as: MAG-OX   metoprolol succinate 100 MG 24 hr tablet Commonly known as: TOPROL-XL   pantoprazole 40 MG tablet Commonly known as: PROTONIX   Potassium Chloride ER 20 MEQ Tbcr   thiamine 100 MG tablet Commonly known as: Vitamin B-1       TAKE these medications    Glycopyrrolate 1 MG/5ML Soln Take 5 mLs (1 mg total) by mouth 4 (four) times daily as needed for up to 3 days.   LORazepam 0.5 MG tablet Commonly known as: Ativan Take 1 tablet (0.5 mg total) by mouth every 4 (four) hours as needed for  up to 20 doses for anxiety.   morphine CONCENTRATE 10 mg / 0.5 ml concentrated solution Take 0.5 mLs (10 mg total) by mouth every 3 (three) hours as needed for moderate pain, severe pain or shortness of breath.   ondansetron 4 MG disintegrating tablet Commonly known as: Zofran ODT Take 1 tablet (4 mg total) by mouth every 8 (eight) hours as needed for nausea or vomiting.        Consultations: Cardiology Palliative medicine Gastroenterology  Procedures/Studies:   DG Chest 2 View  Result Date: 07/03/2021 CLINICAL DATA:  Increasing shortness of breath EXAM: CHEST - 2 VIEW COMPARISON:  05/19/2021 FINDINGS: Enlargement of cardiac silhouette with pulmonary vascular congestion. Atherosclerotic calcification aorta. RIGHT basilar atelectasis. No definite pulmonary infiltrate, pleural effusion, or pneumothorax. Bones demineralized. IMPRESSION: Enlargement of cardiac silhouette with pulmonary vascular congestion. Chronic RIGHT basilar atelectasis. Aortic Atherosclerosis (ICD10-I70.0) and Emphysema (ICD10-J43.9). Electronically Signed   By: Lavonia Dana M.D.   On: 07/03/2021 12:47   DG Ribs Unilateral Left  Result Date: 07/17/2021 CLINICAL DATA:  Left-sided chest wall pain. Pt mentioned a h/o falls. Pt stated that her  upper left side chest is painful when breathing or moving. Pt also stated that it is sore to the touch. Hx; A-fib, chronic diastolic heart failure, high cholesterol, HTN EXAM: LEFT RIBS - 2 VIEW COMPARISON:  None. FINDINGS: No acute displaced fracture or other bone lesions are seen involving the ribs. Persistent cardiomegaly. IMPRESSION: No acute displaced left rib fracture. Please note, nondisplaced rib fractures may be occult on radiograph. Electronically Signed   By: Iven Finn M.D.   On: 07/17/2021 22:55   DG Shoulder Right  Result Date: 07/20/2021 CLINICAL DATA:  RIGHT shoulder pain post fall, pain radiating from shoulder to elbow EXAM: RIGHT SHOULDER - 2+ VIEW COMPARISON:   None FINDINGS: Osseous demineralization. AC joint alignment normal. Visualized RIGHT ribs intact. Minimal calcification adjacent to the humeral head, may represent rotator cuff calcification or minimal chondrocalcinosis. No definite fracture, dislocation, or bone destruction. IMPRESSION: No acute osseous abnormalities. Minimal calcification adjacent to humeral head question rotator cuff calcific tendinitis versus minimal chondrocalcinosis. Electronically Signed   By: Lavonia Dana M.D.   On: 07/20/2021 10:54   CT ABDOMEN PELVIS W CONTRAST  Result Date: 07/17/2021 CLINICAL DATA:  Abdominal distension. EXAM: CT ABDOMEN AND PELVIS WITH CONTRAST TECHNIQUE: Multidetector CT imaging of the abdomen and pelvis was performed using the standard protocol following bolus administration of intravenous contrast. CONTRAST:  138mL OMNIPAQUE IOHEXOL 300 MG/ML  SOLN COMPARISON:  May 19, 2021 FINDINGS: Lower chest: Mild atelectasis is seen within the bilateral lung bases. There is a small right pleural effusion. Hepatobiliary: There is diffuse fatty infiltration of the liver parenchyma. No focal liver abnormality is seen. Status post cholecystectomy. No biliary dilatation. Pancreas: Pancreatic duct is visualized and measures 5 mm in diameter. This is stable in size when compared to the prior study. The pancreatic parenchyma is otherwise unremarkable. Spleen: Normal in size without focal abnormality. Adrenals/Urinary Tract: Adrenal glands are unremarkable. Kidneys are normal in size, without renal calculi or hydronephrosis. A 1.5 cm diameter cyst is seen along the anterolateral aspect of the mid to lower right kidney. Bladder is unremarkable. Stomach/Bowel: Stomach is within normal limits. The appendix is not identified. Surgically anastomosed bowel is again seen along the midline of the lower abdomen and pelvis. This bowel loop is stable in caliber and is persistently dilated. Vascular/Lymphatic: Aortic atherosclerosis. No enlarged  abdominal or pelvic lymph nodes. Reproductive: Uterus and bilateral adnexa are unremarkable. 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 This hernia contains fat, fluid and multiple loops of nondilated small bowel. A stable 8.4 cm x 4.4 cm ventral hernia is seen along the midline of the anterior pelvic wall, adjacent to the previously noted ostomy site. This also contains fat and nondilated small bowel loops. A moderate amount of abdominopelvic ascites is seen. Musculoskeletal: There is moderate to marked severity anasarca throughout the abdominal and pelvic walls. Multilevel degenerative changes are seen throughout the thoracolumbar spine with approximately 5 mm anterolisthesis of the L4 vertebral body on L5. IMPRESSION: 1. Small right pleural effusion. 2. Stable postoperative changes with a left lower quadrant ostomy site and large, stable parastomal hernia. 3. Stable ventral hernia along the midline of the anterior pelvic wall, adjacent to the previously noted ostomy site. 4. Evidence of prior cholecystectomy. 5. Moderate severity abdominopelvic ascites. 6. Moderate to marked severity anasarca. 7. Aortic atherosclerosis. Aortic Atherosclerosis (ICD10-I70.0). Electronically Signed   By: Virgina Norfolk M.D.   On: 07/17/2021 17:49   US  Abdomen Limited  Result Date: 07/18/2021 CLINICAL DATA:  Abdominal distension, concern for ascites. Please perform ascites search ultrasound and ultrasound-guided paracentesis as indicated. EXAM: LIMITED ABDOMEN ULTRASOUND FOR ASCITES TECHNIQUE: Limited ultrasound survey for ascites was performed in all four abdominal quadrants. COMPARISON:  CT abdomen pelvis-07/17/2021 FINDINGS: Sonographic evaluation of the abdomen demonstrates a trace amount of perihepatic ascites (image 4), too small to allow for safe therapeutic or diagnostic paracentesis. No paracentesis attempted. IMPRESSION: Trace amount of perihepatic  ascites, too small to allow for safe ultrasound-guided therapeutic or diagnostic paracentesis. No paracentesis attempted. Electronically Signed   By: Sandi Mariscal M.D.   On: 07/18/2021 10:45   DG Chest Portable 1 View  Result Date: 07/17/2021 CLINICAL DATA:  Shortness of breath. EXAM: PORTABLE CHEST 1 VIEW COMPARISON:  July 02, 2021. FINDINGS: Stable cardiomegaly. Pneumothorax or pleural effusion is noted. Left lung is clear. Stable right basilar subsegmental atelectasis or scarring is noted. Bony thorax is unremarkable. IMPRESSION: Stable right basilar subsegmental atelectasis or scarring. Electronically Signed   By: Marijo Conception M.D.   On: 07/17/2021 15:23   ECHOCARDIOGRAM COMPLETE  Result Date: 07/18/2021    ECHOCARDIOGRAM REPORT   Patient Name:   Maria Travis Date of Exam: 07/18/2021 Medical Rec #:  [id number]        Height:       64.0 in Accession #:    [id number]       Weight:       119.0 lb Date of Birth:  03-Apr-1941        BSA:          1.569 m Patient Age:    26 years         BP:           161/94 mmHg Patient Gender: F                HR:           82 bpm. Exam Location:  Inpatient Procedure: 2D Echo Indications:    acute diastolic chf  History:        Patient has prior history of Echocardiogram examinations, most                 recent 02/09/2021. Arrythmias:Atrial Fibrillation; Risk                 Factors:Hypertension and Dyslipidemia.  Sonographer:    Johny Chess RDCS Referring Phys: [place]  1. Left ventricular ejection fraction, by estimation, is 35 to 40%. The left ventricle has moderately decreased function. The left ventricle demonstrates global hypokinesis. Left ventricular diastolic parameters are consistent with Grade III diastolic dysfunction (restrictive). Elevated left atrial pressure.  2. Right ventricular systolic function is normal. The right ventricular size is normal. There is severely elevated pulmonary artery systolic pressure.  3. Left  atrial size was moderately dilated.  4. Right atrial size was moderately dilated.  5. The mitral valve is abnormal. Mild to moderate mitral valve regurgitation. No evidence of mitral stenosis. Moderate mitral annular calcification.  6. The tricuspid valve is abnormal. Tricuspid valve regurgitation is severe.  7. The aortic valve is tricuspid. There is moderate calcification of the aortic valve. There is moderate thickening of the aortic valve. Aortic valve regurgitation is mild to moderate. No aortic stenosis is present.  8. The inferior vena cava is dilated in size with >50% respiratory variability, suggesting right atrial pressure of 8 mmHg. FINDINGS  Left Ventricle: Left ventricular ejection  fraction, by estimation, is 35 to 40%. The left ventricle has moderately decreased function. The left ventricle demonstrates global hypokinesis. The left ventricular internal cavity size was normal in size. There is no left ventricular hypertrophy. Left ventricular diastolic parameters are consistent with Grade III diastolic dysfunction (restrictive). Elevated left atrial pressure. Right Ventricle: The right ventricular size is normal. No increase in right ventricular wall thickness. Right ventricular systolic function is normal. There is severely elevated pulmonary artery systolic pressure. The tricuspid regurgitant velocity is 3.84 m/s, and with an assumed right atrial pressure of 8 mmHg, the estimated right ventricular systolic pressure is 92.3 mmHg. Left Atrium: Left atrial size was moderately dilated. Right Atrium: Right atrial size was moderately dilated. Pericardium: There is no evidence of pericardial effusion. Mitral Valve: The mitral valve is abnormal. There is moderate thickening of the mitral valve leaflet(s). There is moderate calcification of the mitral valve leaflet(s). Moderate mitral annular calcification. Mild to moderate mitral valve regurgitation. No evidence of mitral valve stenosis. Tricuspid Valve:  There is systolic flow reversal of hepatic veins consistent with severe TR. The tricuspid valve is abnormal. Tricuspid valve regurgitation is severe. No evidence of tricuspid stenosis. Aortic Valve: The aortic valve is tricuspid. There is moderate calcification of the aortic valve. There is moderate thickening of the aortic valve. There is moderate aortic valve annular calcification. Aortic valve regurgitation is mild to moderate. Aortic regurgitation PHT measures 340 msec. No aortic stenosis is present. Pulmonic Valve: The pulmonic valve was not well visualized. Pulmonic valve regurgitation is mild. No evidence of pulmonic stenosis. Aorta: The aortic root is normal in size and structure. Venous: The inferior vena cava is dilated in size with greater than 50% respiratory variability, suggesting right atrial pressure of 8 mmHg. IAS/Shunts: No atrial level shunt detected by color flow Doppler.  LEFT VENTRICLE PLAX 2D LVIDd:         4.50 cm     Diastology LVIDs:         3.80 cm     LV e' medial:    4.24 cm/s LV PW:         0.90 cm     LV E/e' medial:  22.7 LV IVS:        0.80 cm     LV e' lateral:   7.18 cm/s LVOT diam:     1.80 cm     LV E/e' lateral: 13.4 LV SV:         33 LV SV Index:   21 LVOT Area:     2.54 cm  LV Volumes (MOD) LV vol d, MOD A2C: 55.6 ml LV vol s, MOD A2C: 42.4 ml LV SV MOD A2C:     13.2 ml RIGHT VENTRICLE            IVC RV S prime:     8.38 cm/s  IVC diam: 1.90 cm TAPSE (M-mode): 1.1 cm LEFT ATRIUM             Index       RIGHT ATRIUM           Index LA diam:        4.00 cm 2.55 cm/m  RA Area:     21.80 cm LA Vol (A2C):   70.9 ml 45.18 ml/m RA Volume:   63.30 ml  40.34 ml/m LA Vol (A4C):   70.2 ml 44.73 ml/m LA Biplane Vol: 71.9 ml 45.82 ml/m  AORTIC VALVE LVOT Vmax:   90.80 cm/s LVOT Vmean:  56.100 cm/s LVOT VTI:    0.128 m AI PHT:      340 msec  AORTA Ao Root diam: 2.60 cm Ao Asc diam:  3.10 cm MITRAL VALVE                 TRICUSPID VALVE MV Area (PHT): 5.02 cm      TR Peak grad:   59.0  mmHg MV Decel Time: 151 msec      TR Vmax:        384.00 cm/s MR Peak grad:    118.8 mmHg MR Mean grad:    74.0 mmHg   SHUNTS MR Vmax:         545.00 cm/s Systemic VTI:  0.13 m MR Vmean:        408.0 cm/s  Systemic Diam: 1.80 cm MR PISA:         1.01 cm MR PISA Eff ROA: 7 mm MR PISA Radius:  0.40 cm MV E velocity: 96.40 cm/s MV A velocity: 41.10 cm/s MV E/A ratio:  2.35 Carlyle Dolly MD Electronically signed by Carlyle Dolly MD Signature Date/Time: 07/18/2021/5:34:47 PM    Final        The results of significant diagnostics from this hospitalization (including imaging, microbiology, ancillary and laboratory) are listed below for reference.     Microbiology: Recent Results (from the past 240 hour(s))  Resp Panel by RT-PCR (Flu A&B, Covid) Nasopharyngeal Swab     Status: None   Collection Time: 07/17/21  2:51 PM   Specimen: Nasopharyngeal Swab; Nasopharyngeal(NP) swabs in vial transport medium  Result Value Ref Range Status   SARS Coronavirus 2 by RT PCR NEGATIVE NEGATIVE Final    Comment: (NOTE) SARS-CoV-2 target nucleic acids are NOT DETECTED.  The SARS-CoV-2 RNA is generally detectable in upper respiratory specimens during the acute phase of infection. The lowest concentration of SARS-CoV-2 viral copies this assay can detect is 138 copies/mL. A negative result does not preclude SARS-Cov-2 infection and should not be used as the sole basis for treatment or other patient management decisions. A negative result may occur with  improper specimen collection/handling, submission of specimen other than nasopharyngeal swab, presence of viral mutation(s) within the areas targeted by this assay, and inadequate number of viral copies(<138 copies/mL). A negative result must be combined with clinical observations, patient history, and epidemiological information. The expected result is Negative.  Fact Sheet for Patients:  EntrepreneurPulse.com.au  Fact Sheet for Healthcare  Providers:  IncredibleEmployment.be  This test is no t yet approved or cleared by the [place] FDA and  has been authorized for detection and/or diagnosis of SARS-CoV-2 by FDA under an Emergency Use Authorization (EUA). This EUA will remain  in effect (meaning this test can be used) for the duration of the COVID-19 declaration under Section 564(b)(1) of the Act, 21 U.S.C.section 360bbb-3(b)(1), unless the authorization is terminated  or revoked sooner.       Influenza A by PCR NEGATIVE NEGATIVE Final   Influenza B by PCR NEGATIVE NEGATIVE Final    Comment: (NOTE) The Xpert Xpress SARS-CoV-2/FLU/RSV plus assay is intended as an aid in the diagnosis of influenza from Nasopharyngeal swab specimens and should not be used as a sole basis for treatment. Nasal washings and aspirates are unacceptable for Xpert Xpress SARS-CoV-2/FLU/RSV testing.  Fact Sheet for Patients: EntrepreneurPulse.com.au  Fact Sheet for Healthcare Providers: IncredibleEmployment.be  This test is not yet approved or cleared by the [place] FDA and has been authorized for detection and/or  diagnosis of SARS-CoV-2 by FDA under an Emergency Use Authorization (EUA). This EUA will remain in effect (meaning this test can be used) for the duration of the COVID-19 declaration under Section 564(b)(1) of the Act, 21 U.S.C. section 360bbb-3(b)(1), unless the authorization is terminated or revoked.  Performed at [facility], [place] [address], [place], [place] [address]      Labs:  CBC: Recent Labs  Lab 07/17/21 1454 07/19/21 0319 07/20/21 0225  WBC 10.9* 12.4* 12.7*  NEUTROABS 9.1*  --   --   HGB 10.2* 11.1* 11.3*  HCT 32.1* 33.9* 34.0*  MCV 94.4 89.9 89.2  PLT 97* 124* 133*   BMP &GFR Recent Labs  Lab 07/17/21 1454 07/18/21 0341 07/19/21 0319 07/20/21 0225 07/21/21 0452 07/22/21 0321  NA 142 140 138 134* 133* 134*  K 4.3 3.6 3.2*  3.7 3.3* 3.6  CL 112* 110 104 103 102 102  CO2 21* 20* 20* 21* 23 23  GLUCOSE 86 76 105* 114* 93 98  BUN 21 19 22  29* 38* 37*  CREATININE 1.30* 1.25* 1.30* 1.36* 1.59* 1.53*  CALCIUM 9.0 9.3 9.0 8.7* 8.6* 8.7*  MG 1.5*  --   --  1.3*  --  2.3   Estimated Creatinine Clearance: 23.5 mL/min (A) (by C-G formula based on SCr of 1.53 mg/dL (H)). Liver & Pancreas: Recent Labs  Lab 07/17/21 1454 07/22/21 0321  AST 34  --   ALT 16  --   ALKPHOS 86  --   BILITOT 1.3*  --   PROT 6.0*  --   ALBUMIN 3.3* 2.7*   Recent Labs  Lab 07/17/21 1454  LIPASE 39   No results for input(s): AMMONIA in the last 168 hours. Diabetic: No results for input(s): HGBA1C in the last 72 hours. No results for input(s): GLUCAP in the last 168 hours. Cardiac Enzymes: No results for input(s): CKTOTAL, CKMB, CKMBINDEX, TROPONINI in the last 168 hours. No results for input(s): PROBNP in the last 8760 hours. Coagulation Profile: Recent Labs  Lab 07/17/21 1454 07/19/21 0319  INR 1.5* 1.8*   Thyroid Function Tests: No results for input(s): TSH, T4TOTAL, FREET4, T3FREE, THYROIDAB in the last 72 hours. Lipid Profile: No results for input(s): CHOL, HDL, LDLCALC, TRIG, CHOLHDL, LDLDIRECT in the last 72 hours. Anemia Panel: No results for input(s): VITAMINB12, FOLATE, FERRITIN, TIBC, IRON, RETICCTPCT in the last 72 hours. Urine analysis:    Component Value Date/Time   COLORURINE STRAW (A) 07/18/2021 0451   APPEARANCEUR CLEAR 07/18/2021 0451   LABSPEC 1.015 07/18/2021 0451   PHURINE 5.0 07/18/2021 0451   GLUCOSEU NEGATIVE 07/18/2021 0451   HGBUR SMALL (A) 07/18/2021 0451   BILIRUBINUR NEGATIVE 07/18/2021 0451   KETONESUR NEGATIVE 07/18/2021 0451   PROTEINUR NEGATIVE 07/18/2021 0451   NITRITE NEGATIVE 07/18/2021 0451   LEUKOCYTESUR NEGATIVE 07/18/2021 0451   Sepsis Labs: Invalid input(s): PROCALCITONIN, LACTICIDVEN   Time coordinating discharge: 45 minutes  SIGNED:  Mercy Riding, MD  [facility] 07/23/2021, 2:41 PM

## 2021-07-23 NOTE — Progress Notes (Signed)
   07/23/21 1030  Clinical Encounter Type  Visited With Patient and family together  Visit Type Initial;Spiritual support  Referral From Nurse  Consult/Referral To Chaplain   Chaplain followed-up after yesterday's visit. Pt's daughter, [name], was at bedside. Chaplain prayed with Pt and Pt's daughter. No further spiritual care needs at this time. Chaplain remains available.  This note was prepared by Chaplain Resident, Dante Gang, MDiv. Chaplain remains available as needed through the on-call pager: [phone].

## 2021-07-23 NOTE — TOC Transition Note (Signed)
Transition of Care [facility]) - CM/SW Discharge Note   Patient Details  Name: Maria Travis MRN: [id number] Date of Birth: 04-08-1941  Transition of Care [facility] CM/SW Contact:  Zenon Mayo, RN Phone Number: 07/23/2021, 3:47 PM   Clinical Narrative:    Patient is for dc today to [place] has been scheduled ambulance packet on the unit.  Staff RN to call report to [phone].  NCM notified daughter [name] that ambulance has been called as well and MaryAnne with [facility].   Final next level of care: [place] Barriers to Discharge: No Barriers Identified   Patient Goals and CMS Choice Patient states their goals for this hospitalization and ongoing recovery are:: [facility] CMS Medicare.gov Compare Post Acute Care list provided to:: Patient Represenative (must comment) Choice offered to / list presented to : Adult Children  Discharge Placement              Patient chooses bed at:  [facility]) Patient to be transferred to facility by: ptar Name of family member notified: [name] (daughter) Patient and family notified of of transfer: 07/23/21  Discharge Plan and Services                  DME Agency: NA                  Social Determinants of Health (SDOH) Interventions Food Insecurity Interventions: Intervention Not Indicated Financial Strain Interventions: Intervention Not Indicated Housing Interventions: Intervention Not Indicated Transportation Interventions: Intervention Not Indicated   Readmission Risk Interventions No flowsheet data found.

## 2021-07-28 ENCOUNTER — Encounter (HOSPITAL_COMMUNITY): Payer: Medicare Other

## 2021-08-06 ENCOUNTER — Ambulatory Visit: Payer: Medicare Other | Admitting: Cardiology

## 2021-08-25 DEATH — deceased

## 2022-01-02 IMAGING — DX DG CHEST 1V PORT
1 series · 1 of 1 positions shown · non-contrast
Comparison: 10/17/2020

CLINICAL DATA: Atrial fibrillation, dyspnea

EXAM:
PORTABLE CHEST 1 VIEW

[chest]
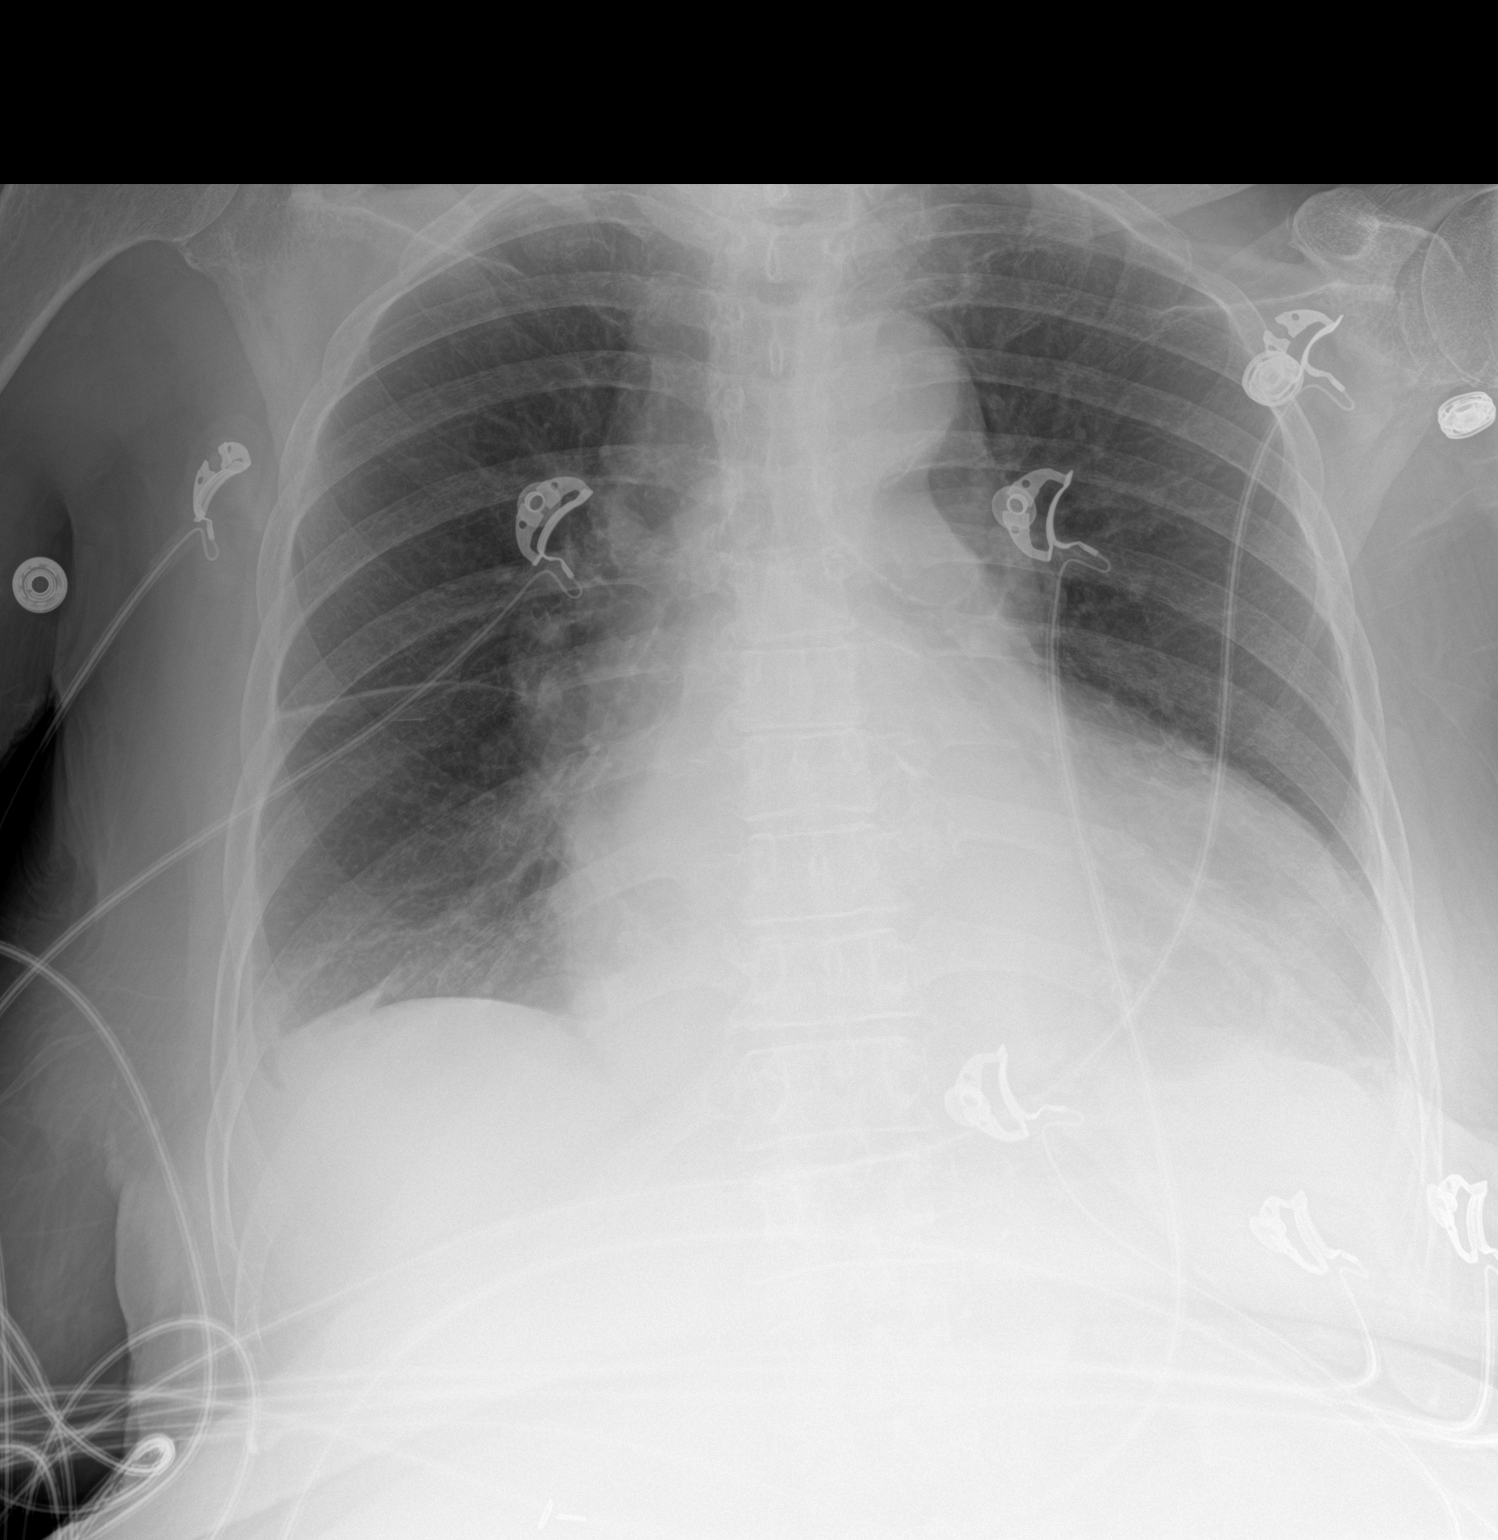

[1 of 1 positions shown; findings below may reference images not displayed]

FINDINGS: The lungs are symmetrically well expanded. Small right pleural
effusion is present with associated minimal right basilar
atelectasis. The lungs are otherwise clear. No pneumothorax. Cardiac
size is mildly enlarged, unchanged. Pulmonary vascularity is normal.
No acute bone abnormality.
IMPRESSION: Interval development of small right pleural effusion.

Stable cardiomegaly.

## 2022-05-21 IMAGING — CR DG CHEST 2V
2 series · 2 of 2 positions shown · non-contrast
Comparison: December 05, 2020

CLINICAL DATA: Cough.  Peripheral edema

EXAM:
CHEST - 2 VIEW

[w chest lat]
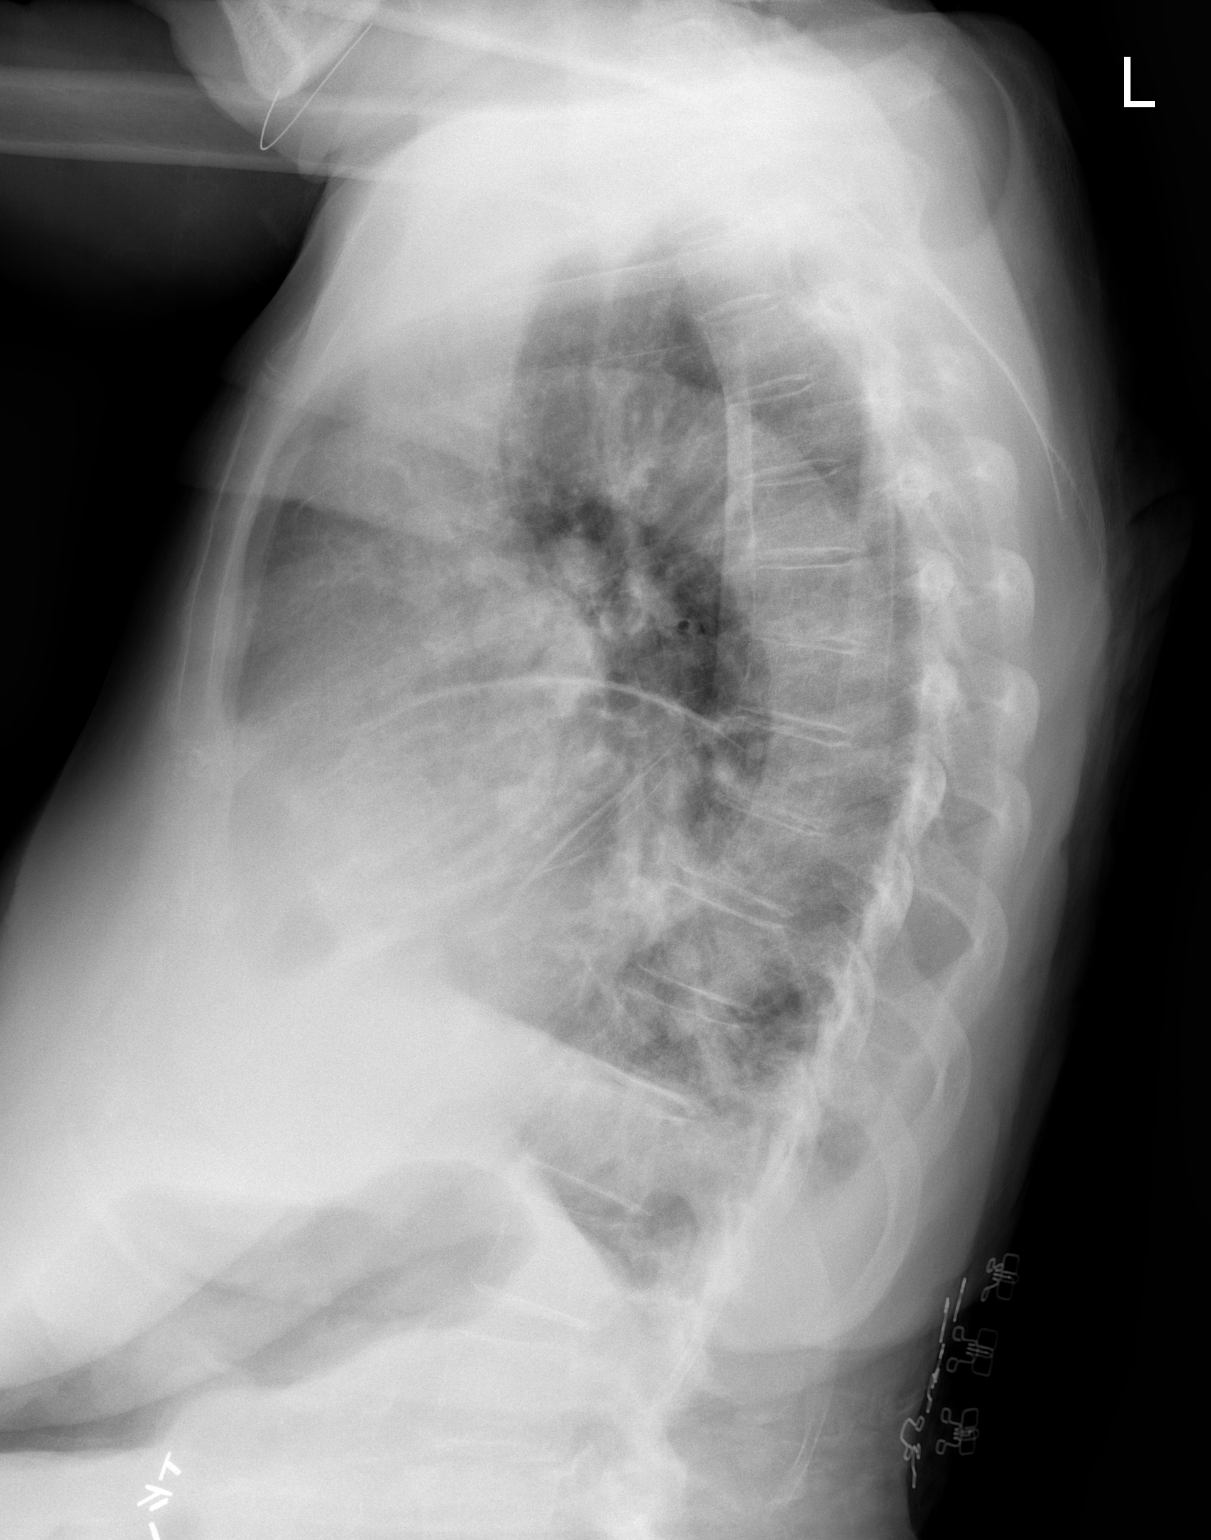

[w chest ap]
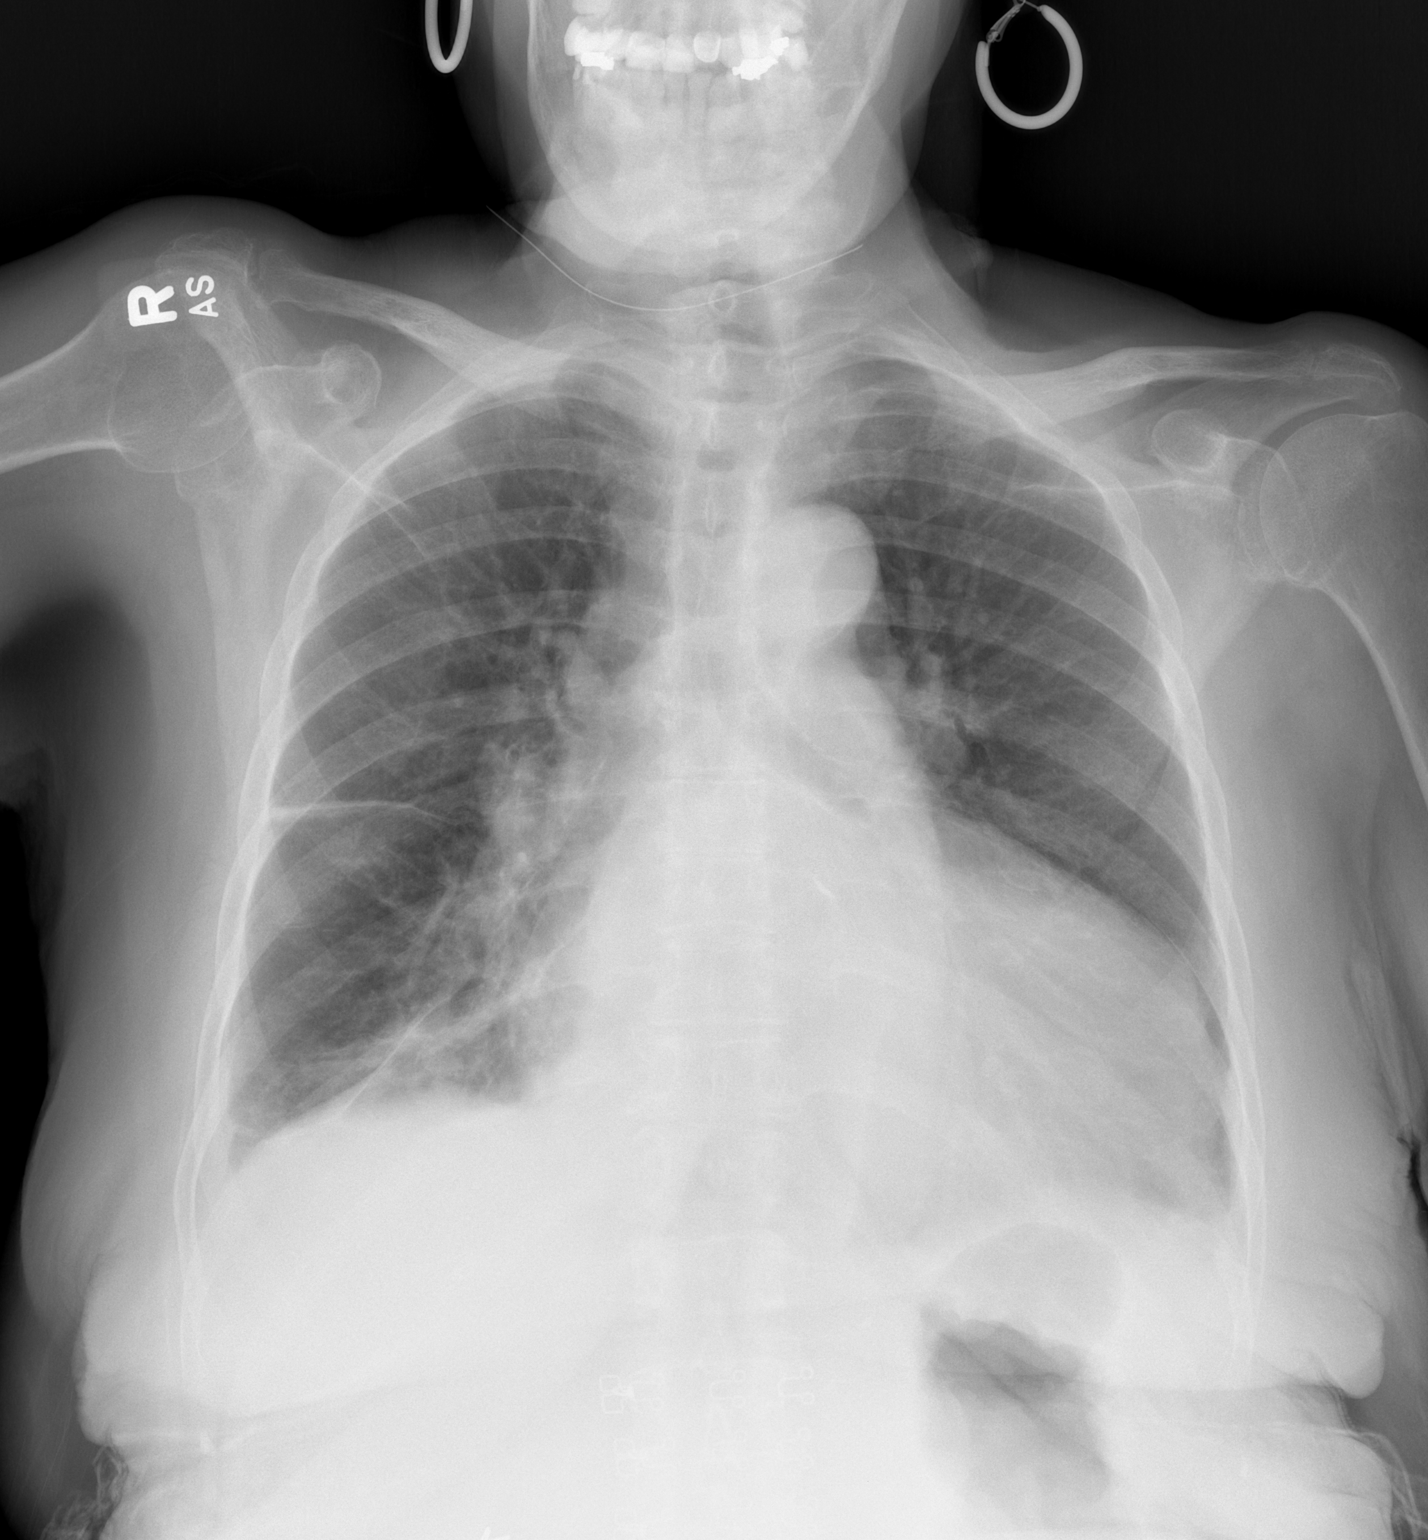

[2 of 2 positions shown; findings below may reference images not displayed]

FINDINGS: Stable cardiomegaly. The hila and mediastinum are unremarkable. No
pneumothorax. No overt edema. Increased opacity is seen in the
medial right lung base. Mild thickening of the right fissure seen on
the frontal view. No other acute abnormalities.
IMPRESSION: 1. Infiltrate in the medial right lung base worrisome for pneumonia
given history. Recommend short-term follow-up imaging to ensure
resolution.
2. Thickening of the right fissure suggests a small amount of
pleural fluid.

## 2022-06-19 IMAGING — DX DG CHEST 1V PORT
1 series · 1 of 1 positions shown · non-contrast
Comparison: Chest radiographs 05/06/2021 and earlier.

CLINICAL DATA: 79-year-old female with shortness of breath onset
today.

EXAM:
PORTABLE CHEST 1 VIEW

[chest ap]
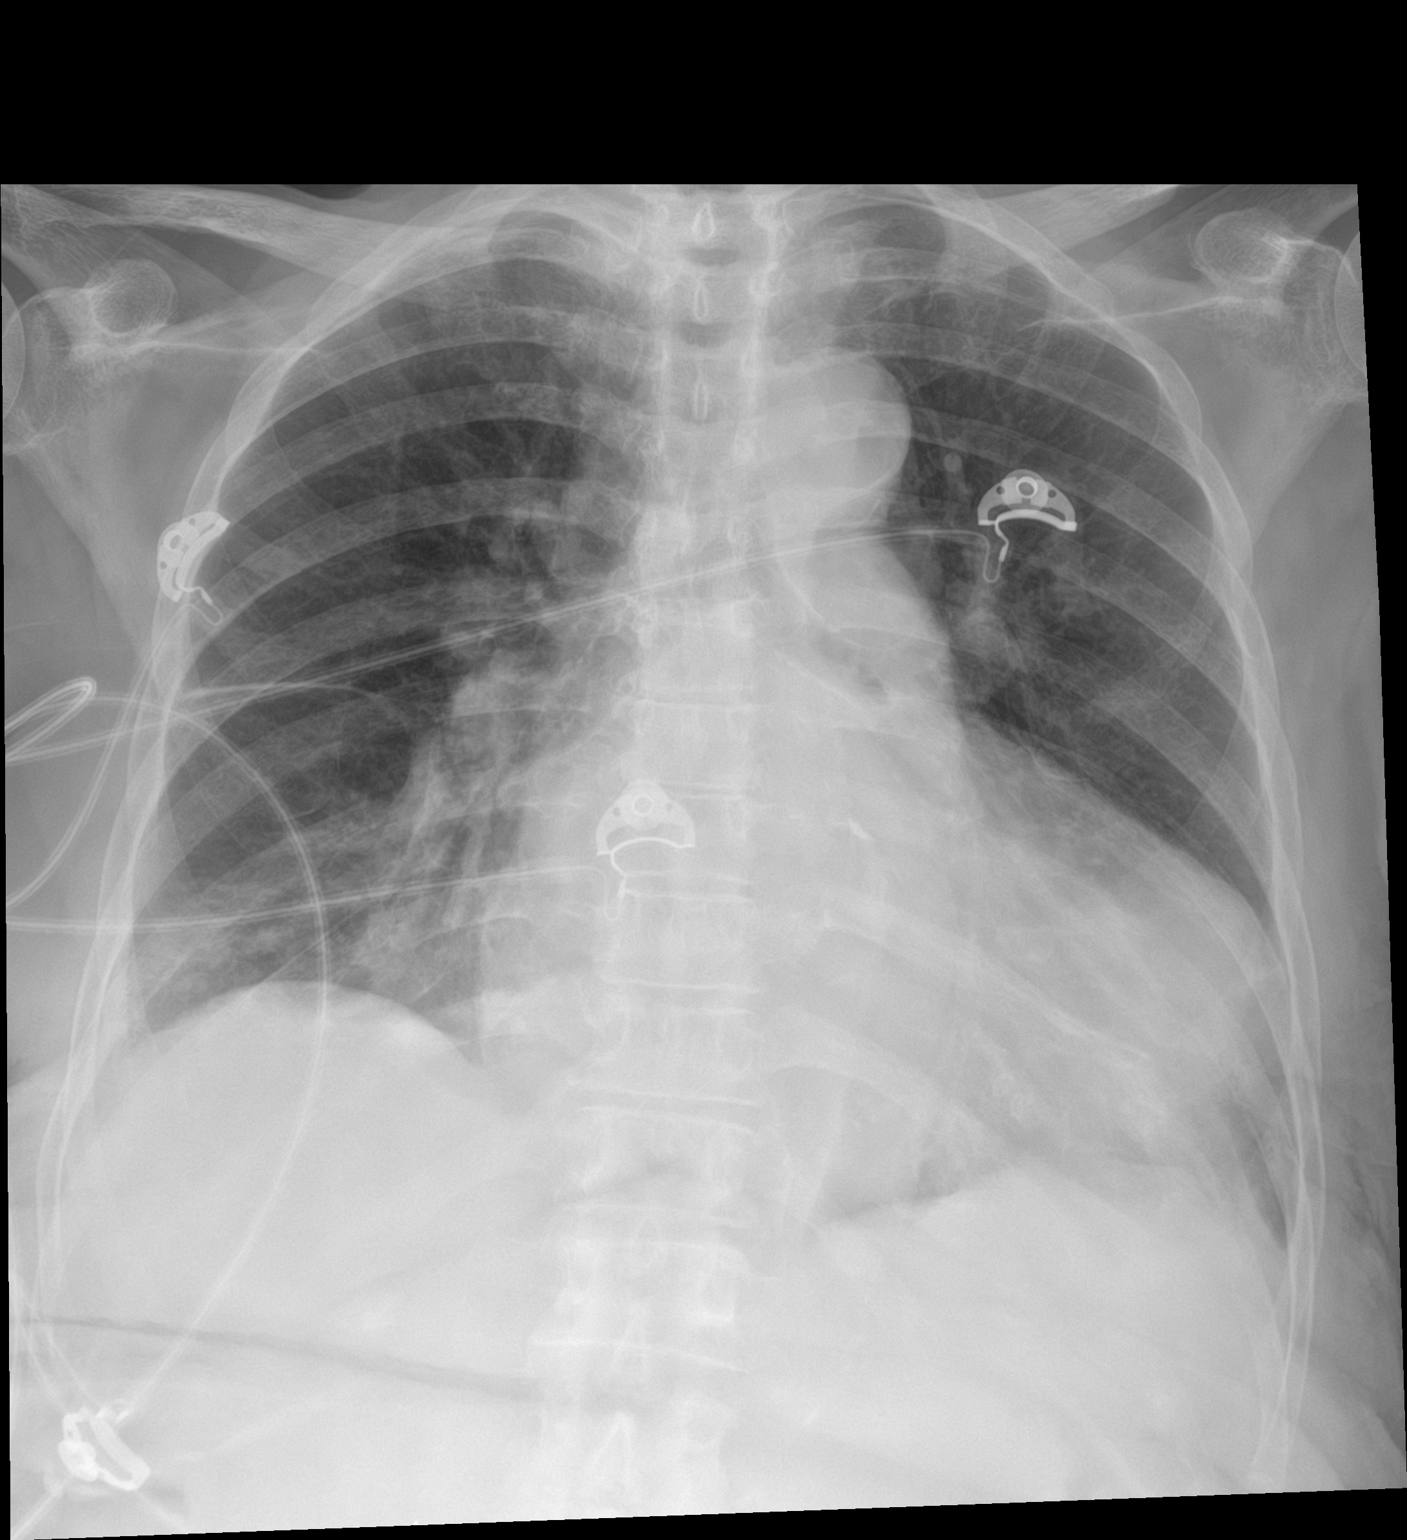

[1 of 1 positions shown; findings below may reference images not displayed]

FINDINGS: Portable AP semi upright view at 5725 hours. Stable cardiomegaly and
mediastinal contours. Calcified aortic atherosclerosis. Visualized
tracheal air column is within normal limits. No acute pulmonary
edema. Small bilateral pleural effusions do not appear significantly
changed from earlier this month. No new pulmonary opacity. No acute
osseous abnormality identified.
IMPRESSION: Stable cardiomegaly and small pleural effusions. No overt edema or
new cardiopulmonary abnormality.

Aortic Atherosclerosis ([id number]).
